# Patient Record
Sex: Male | Born: 1940 | Race: White | Hispanic: No | Marital: Married | Smoking: Never smoker
Health system: Southern US, Community
[De-identification: ages and names within clinical notes are randomized; demographics above are authoritative.]

## PROBLEM LIST (undated history)

## (undated) DIAGNOSIS — E785 Hyperlipidemia, unspecified: Secondary | ICD-10-CM

## (undated) DIAGNOSIS — Z0000 Encounter for general adult medical examination without abnormal findings: Secondary | ICD-10-CM

## (undated) DIAGNOSIS — D649 Anemia, unspecified: Principal | ICD-10-CM

## (undated) DIAGNOSIS — I10 Essential (primary) hypertension: Secondary | ICD-10-CM

## (undated) DIAGNOSIS — M940 Chondrocostal junction syndrome [Tietze]: Secondary | ICD-10-CM

## (undated) DIAGNOSIS — R000 Tachycardia, unspecified: Secondary | ICD-10-CM

## (undated) DIAGNOSIS — E669 Obesity, unspecified: Secondary | ICD-10-CM

## (undated) DIAGNOSIS — W57XXXA Bitten or stung by nonvenomous insect and other nonvenomous arthropods, initial encounter: Secondary | ICD-10-CM

## (undated) DIAGNOSIS — B029 Zoster without complications: Secondary | ICD-10-CM

## (undated) DIAGNOSIS — K219 Gastro-esophageal reflux disease without esophagitis: Secondary | ICD-10-CM

## (undated) DIAGNOSIS — M25569 Pain in unspecified knee: Secondary | ICD-10-CM

## (undated) DIAGNOSIS — G8929 Other chronic pain: Secondary | ICD-10-CM

## (undated) DIAGNOSIS — R040 Epistaxis: Secondary | ICD-10-CM

## (undated) DIAGNOSIS — R51 Headache: Secondary | ICD-10-CM

## (undated) DIAGNOSIS — K859 Acute pancreatitis without necrosis or infection, unspecified: Secondary | ICD-10-CM

## (undated) DIAGNOSIS — K5900 Constipation, unspecified: Secondary | ICD-10-CM

## (undated) DIAGNOSIS — J019 Acute sinusitis, unspecified: Secondary | ICD-10-CM

## (undated) HISTORY — DX: Pain in unspecified knee: M25.569

## (undated) HISTORY — DX: Encounter for general adult medical examination without abnormal findings: Z00.00

## (undated) HISTORY — DX: Other chronic pain: G89.29

## (undated) HISTORY — DX: Zoster without complications: B02.9

## (undated) HISTORY — DX: Anemia, unspecified: D64.9

## (undated) HISTORY — DX: Tachycardia, unspecified: R00.0

## (undated) HISTORY — DX: Headache: R51

## (undated) HISTORY — DX: Hypocalcemia: E83.51

## (undated) HISTORY — DX: Bitten or stung by nonvenomous insect and other nonvenomous arthropods, initial encounter: W57.XXXA

## (undated) HISTORY — DX: Hyperlipidemia, unspecified: E78.5

## (undated) HISTORY — DX: Acute pancreatitis without necrosis or infection, unspecified: K85.90

## (undated) HISTORY — DX: Acute sinusitis, unspecified: J01.90

## (undated) HISTORY — DX: Constipation, unspecified: K59.00

## (undated) HISTORY — DX: Epistaxis: R04.0

## (undated) HISTORY — DX: Obesity, unspecified: E66.9

## (undated) HISTORY — DX: Chondrocostal junction syndrome (tietze): M94.0

## (undated) HISTORY — PX: CHOLECYSTECTOMY: SHX55

---

## 2004-12-07 ENCOUNTER — Encounter: Admission: RE | Admit: 2004-12-07 | Discharge: 2004-12-07 | Payer: Self-pay | Admitting: Orthopedic Surgery

## 2011-05-05 ENCOUNTER — Encounter: Payer: Self-pay | Admitting: *Deleted

## 2011-05-05 ENCOUNTER — Emergency Department (HOSPITAL_BASED_OUTPATIENT_CLINIC_OR_DEPARTMENT_OTHER)
Admission: EM | Admit: 2011-05-05 | Discharge: 2011-05-06 | Disposition: A | Discharge status: Home / Self Care | Payer: Medicare Other | Attending: Emergency Medicine | Admitting: Emergency Medicine

## 2011-05-05 DIAGNOSIS — K219 Gastro-esophageal reflux disease without esophagitis: Secondary | ICD-10-CM | POA: Insufficient documentation

## 2011-05-05 DIAGNOSIS — R1013 Epigastric pain: Secondary | ICD-10-CM | POA: Insufficient documentation

## 2011-05-05 DIAGNOSIS — I10 Essential (primary) hypertension: Secondary | ICD-10-CM | POA: Insufficient documentation

## 2011-05-05 HISTORY — DX: Gastro-esophageal reflux disease without esophagitis: K21.9

## 2011-05-05 HISTORY — DX: Essential (primary) hypertension: I10

## 2011-05-05 NOTE — ED Notes (Signed)
Pt states that he has been having upper abd pain since yesterday caused by his acid reflux. Pt denies N/V/D, no diaphoresis.

## 2011-05-05 NOTE — ED Notes (Signed)
Pt c/o increased indigestion sxs since yesterday, pt currently treats sxs with nexium but has had little relief.

## 2011-05-06 ENCOUNTER — Other Ambulatory Visit: Payer: Self-pay

## 2011-05-06 MED ORDER — GI COCKTAIL ~~LOC~~
ORAL | Status: AC
  Filled 2011-05-06: qty 30

## 2011-05-06 MED ORDER — SUCRALFATE 1 G PO TABS
1.0000 g | ORAL_TABLET | Freq: Once | ORAL | Status: AC
  Administered 2011-05-06: 1 g via ORAL
  Filled 2011-05-06: qty 1

## 2011-05-06 MED ORDER — SUCRALFATE 1 G PO TABS: ORAL_TABLET | ORAL | Status: DC

## 2011-05-06 MED ORDER — GI COCKTAIL ~~LOC~~
30.0000 mL | Freq: Once | ORAL | Status: AC
  Administered 2011-05-06: 30 mL via ORAL

## 2011-05-06 NOTE — ED Provider Notes (Signed)
History     CSN: [id number] Arrival date & time: 05/05/2011 11:50 PM   First MD Initiated Contact with Patient 05/06/11 0141      Chief Complaint  Patient presents with  . Gastrophageal Reflux    (Consider location/radiation/quality/duration/timing/severity/associated sxs/prior treatment) HPI This is a 70 year old white male with a history of gastroesophageal reflux disease. He has been having about 36 hours of epigastric discomfort that his typical for his GERD symptoms. He describes this as a vague pain in the epigastrium that does not radiate. It has not improved with an extra dose of Nexium nor with carbonated beverages. He's had a decreased appetite today. He states he had a similar exacerbation once in the past. There is no associated nausea, vomiting, diarrhea, constipation, chest pain, dyspnea or diaphoresis. He was given a GI cocktail by his nurse prior to my evaluation with significant improvement in his symptoms.   Past Medical History  Diagnosis Date  . Hypertension   . GERD (gastroesophageal reflux disease)     Past Surgical History  Procedure Date  . Cholecystectomy     History reviewed. No pertinent family history.  History  Substance Use Topics  . Smoking status: Never Smoker   . Smokeless tobacco: Not on file  . Alcohol Use: No      Review of Systems  All other systems reviewed and are negative.    Allergies  Review of patient's allergies indicates not on file.  Home Medications   Current Outpatient Rx  Name Route Sig Dispense Refill  . ESOMEPRAZOLE MAGNESIUM 20 MG PO CPDR Oral Take 20 mg by mouth daily before breakfast.      . LISINOPRIL 40 MG PO TABS Oral Take 40 mg by mouth daily.        BP 170/100  Pulse 70  Temp(Src) 98.7 F (37.1 C) (Oral)  Resp 17  Ht 6\' 2"  (1.88 m)  Wt 230 lb (104.327 kg)  BMI 29.53 kg/m2  SpO2 98%  Physical Exam General: Well-developed, well-nourished male in no acute distress; appearance consistent with age  of record HENT: normocephalic, atraumatic Eyes: Normal appearance Neck: supple Heart: regular rate and rhythm Lungs: clear to auscultation bilaterally Abdomen: soft; nontender Extremities: No deformity; full range of motion; pulses normal Neurologic: Awake, alert and oriented; motor function intact in all extremities and symmetric; no facial droop Skin: Warm and dry Psychiatric: Normal mood and affect    ED Course  Procedures (including critical care time)    MDM   EKG Interpretation:  Date & Time: 05/06/2011 12:09 AM  Rate: 67  Rhythm: normal sinus rhythm  QRS Axis: normal  Intervals: normal  ST/T Wave abnormalities: normal  Conduction Disutrbances:none  Narrative Interpretation:   Old EKG Reviewed: none available  1:51 AM We'll add Carafate           Hanley Seamen, MD 05/06/11 0151

## 2011-05-06 NOTE — ED Notes (Signed)
MD at bedside. 

## 2011-07-11 DIAGNOSIS — L821 Other seborrheic keratosis: Secondary | ICD-10-CM | POA: Diagnosis not present

## 2011-07-11 DIAGNOSIS — B079 Viral wart, unspecified: Secondary | ICD-10-CM | POA: Diagnosis not present

## 2011-10-06 DIAGNOSIS — R7309 Other abnormal glucose: Secondary | ICD-10-CM | POA: Diagnosis not present

## 2011-10-06 DIAGNOSIS — Z0000 Encounter for general adult medical examination without abnormal findings: Secondary | ICD-10-CM | POA: Diagnosis not present

## 2011-10-06 DIAGNOSIS — I10 Essential (primary) hypertension: Secondary | ICD-10-CM | POA: Diagnosis not present

## 2011-10-06 DIAGNOSIS — M21619 Bunion of unspecified foot: Secondary | ICD-10-CM | POA: Diagnosis not present

## 2011-10-06 DIAGNOSIS — R05 Cough: Secondary | ICD-10-CM | POA: Diagnosis not present

## 2011-10-06 DIAGNOSIS — Z125 Encounter for screening for malignant neoplasm of prostate: Secondary | ICD-10-CM | POA: Diagnosis not present

## 2011-12-21 DIAGNOSIS — J019 Acute sinusitis, unspecified: Secondary | ICD-10-CM | POA: Diagnosis not present

## 2011-12-21 DIAGNOSIS — H66009 Acute suppurative otitis media without spontaneous rupture of ear drum, unspecified ear: Secondary | ICD-10-CM | POA: Diagnosis not present

## 2011-12-28 DIAGNOSIS — L02419 Cutaneous abscess of limb, unspecified: Secondary | ICD-10-CM | POA: Diagnosis not present

## 2012-04-02 ENCOUNTER — Ambulatory Visit (INDEPENDENT_AMBULATORY_CARE_PROVIDER_SITE_OTHER): Payer: Medicare Other | Admitting: Internal Medicine

## 2012-04-02 ENCOUNTER — Encounter: Payer: Self-pay | Admitting: Internal Medicine

## 2012-04-02 VITALS — BP 156/90 | HR 66 | Temp 97.8°F | Resp 16 | Ht 74.25 in | Wt 237.8 lb

## 2012-04-02 DIAGNOSIS — I10 Essential (primary) hypertension: Secondary | ICD-10-CM | POA: Insufficient documentation

## 2012-04-02 DIAGNOSIS — Z125 Encounter for screening for malignant neoplasm of prostate: Secondary | ICD-10-CM | POA: Diagnosis not present

## 2012-04-02 DIAGNOSIS — K219 Gastro-esophageal reflux disease without esophagitis: Secondary | ICD-10-CM

## 2012-04-02 DIAGNOSIS — Z79899 Other long term (current) drug therapy: Secondary | ICD-10-CM

## 2012-04-02 DIAGNOSIS — Z23 Encounter for immunization: Secondary | ICD-10-CM | POA: Diagnosis not present

## 2012-04-02 HISTORY — DX: Essential (primary) hypertension: I10

## 2012-04-02 LAB — CBC WITH DIFFERENTIAL/PLATELET
Basophils Relative: 1 % (ref 0–1)
HCT: 38.5 % — ABNORMAL LOW (ref 39.0–52.0)
Hemoglobin: 13.6 g/dL (ref 13.0–17.0)
Lymphocytes Relative: 24 % (ref 12–46)
Lymphs Abs: 1.4 10*3/uL (ref 0.7–4.0)
MCV: 90.6 fL (ref 78.0–100.0)
Monocytes Absolute: 0.5 10*3/uL (ref 0.1–1.0)
Monocytes Relative: 9 % (ref 3–12)
Neutro Abs: 4 10*3/uL (ref 1.7–7.7)
Neutrophils Relative %: 65 % (ref 43–77)
RBC: 4.25 MIL/uL (ref 4.22–5.81)

## 2012-04-02 LAB — BASIC METABOLIC PANEL
BUN: 14 mg/dL (ref 6–23)
Creat: 1.19 mg/dL (ref 0.50–1.35)
Potassium: 5 mEq/L (ref 3.5–5.3)
Sodium: 141 mEq/L (ref 135–145)

## 2012-04-02 LAB — PSA, MEDICARE: PSA: 0.53 ng/mL (ref <=4.00)

## 2012-04-02 MED ORDER — LISINOPRIL 40 MG PO TABS: 40.0000 mg | ORAL_TABLET | Freq: Every day | ORAL | Status: DC

## 2012-04-02 MED ORDER — ESOMEPRAZOLE MAGNESIUM 20 MG PO CPDR: 20.0000 mg | DELAYED_RELEASE_CAPSULE | Freq: Every day | ORAL | Status: DC

## 2012-04-02 NOTE — Assessment & Plan Note (Signed)
Elevated blood pressure. Unclear trend. Recommend out patient blood pressure log to be submitted for review. Followup in three weeks or sooner if necessary. Obtain CBC Chem-7 and TSH. Continue current dose of lisinopril  currently.

## 2012-04-02 NOTE — Assessment & Plan Note (Signed)
Stable. Continue Nexium daily.

## 2012-04-02 NOTE — Progress Notes (Signed)
  Subjective:    Patient ID: Anthony Poole, male    DOB: [birth year], 71 y.o.   MRN: [id number]  HPI patient presents to clinic to establish care and for evaluation of multiple medical problems. Has no history of hypertension treated with fosinopril without cough or lip swelling. Blood pressure elevated on triage and rechecked to be 156/90. Notes intermittent headache without neurologic deficit. Has GERD symptoms well controlled on Nexium daily. Has not undergone colonoscopy and declines. Understands current recommendation for colorectal cancer screening. Believes underwent Zostavax 2012  Past Medical History  Diagnosis Date  . Hypertension   . GERD (gastroesophageal reflux disease)    Past Surgical History  Procedure Date  . Cholecystectomy     reports that he has never smoked. He does not have any smokeless tobacco history on file. He reports that he does not drink alcohol or use illicit drugs. family history is not on file. Allergies  Allergen Reactions  . Fish Allergy Itching     Review of Systems  Neurological: Positive for headaches.  All other systems reviewed and are negative.       Objective:   Physical Exam  Nursing note and vitals reviewed. Constitutional: He appears well-developed and well-nourished. No distress.  HENT:  Head: Normocephalic and atraumatic.  Right Ear: External ear normal.  Left Ear: External ear normal.  Eyes: Conjunctivae normal and EOM are normal.  Neck: Neck supple. No thyromegaly present.  Cardiovascular: Normal rate, regular rhythm and normal heart sounds.  Exam reveals no gallop and no friction rub.   No murmur heard. Pulmonary/Chest: Effort normal and breath sounds normal. No respiratory distress. He has no wheezes. He has no rales.  Lymphadenopathy:    He has no cervical adenopathy.  Neurological: He is alert.  Skin: Skin is warm and dry. He is not diaphoretic.  Psychiatric: He has a normal mood and affect.          Assessment  & Plan:

## 2012-04-04 ENCOUNTER — Telehealth: Payer: Self-pay | Admitting: Internal Medicine

## 2012-04-04 NOTE — Telephone Encounter (Signed)
Received medical records from [facility]  P: [phone] F: [phone]

## 2012-04-23 ENCOUNTER — Ambulatory Visit (INDEPENDENT_AMBULATORY_CARE_PROVIDER_SITE_OTHER): Payer: Medicare Other | Admitting: Internal Medicine

## 2012-04-23 ENCOUNTER — Encounter: Payer: Self-pay | Admitting: Internal Medicine

## 2012-04-23 VITALS — BP 136/96 | HR 63 | Temp 97.9°F | Resp 18 | Wt 235.0 lb

## 2012-04-23 DIAGNOSIS — M549 Dorsalgia, unspecified: Secondary | ICD-10-CM

## 2012-04-23 DIAGNOSIS — I10 Essential (primary) hypertension: Secondary | ICD-10-CM

## 2012-04-23 MED ORDER — CYCLOBENZAPRINE HCL 5 MG PO TABS: 5.0000 mg | ORAL_TABLET | Freq: Three times a day (TID) | ORAL | Status: DC | PRN

## 2012-04-23 MED ORDER — AMLODIPINE BESYLATE 5 MG PO TABS: 5.0000 mg | ORAL_TABLET | Freq: Every day | ORAL | Status: DC

## 2012-04-23 NOTE — Progress Notes (Signed)
  Subjective:    Patient ID: Anthony Poole, male    DOB: 23-Feb-1941, 71 y.o.   MRN: [id number]  HPI Pt presents to clinic for follow up of HTN. Blood pressure reviewed elevated without associated symptoms. Compliant with medication without adverse effect. Notes recent left paraspinal lumbar pain without radicular pain, numbness or tingling or weakness. No injury or trauma.  Past Medical History  Diagnosis Date  . Hypertension   . GERD (gastroesophageal reflux disease)    Past Surgical History  Procedure Date  . Cholecystectomy     reports that he has never smoked. He does not have any smokeless tobacco history on file. He reports that he does not drink alcohol or use illicit drugs. family history is not on file. Allergies  Allergen Reactions  . Fish Allergy Itching     Review of Systems see hpi     Objective:   Physical Exam  Physical Exam  Nursing note and vitals reviewed. Constitutional: Appears well-developed and well-nourished. No distress.  HENT:  Head: Normocephalic and atraumatic.  Right Ear: External ear normal.  Left Ear: External ear normal.  Eyes: Conjunctivae are normal. No scleral icterus.  Neck: Neck supple. Carotid bruit is not present.  Cardiovascular: Normal rate, regular rhythm and normal heart sounds.  Exam reveals no gallop and no friction rub.   No murmur heard. Pulmonary/Chest: Effort normal and breath sounds normal. No respiratory distress. He has no wheezes. no rales.  Lymphadenopathy:    He has no cervical adenopathy.  Neurological:Alert.  Skin: Skin is warm and dry. Not diaphoretic.  Psychiatric: Has a normal mood and affect.  Muscle skeletal: Left paraspinal muscle tenderness. No lower extremity weakness. Gait normal. No midline lumbar sacral tenderness or bony abnormality      Assessment & Plan:

## 2012-04-29 DIAGNOSIS — M545 Low back pain, unspecified: Secondary | ICD-10-CM

## 2012-04-29 HISTORY — DX: Low back pain, unspecified: M54.50

## 2012-04-29 NOTE — Assessment & Plan Note (Signed)
Suboptimal control. Add Norvasc 5 mg daily. Monitor blood pressure regularly and call blood pressure log to clinic in approximately 2 weeks.

## 2012-04-29 NOTE — Assessment & Plan Note (Signed)
Attempt Flexeril when necessary. Cautioned regarding possible sedating effect. Followup if no improvement or worsening.

## 2012-05-14 ENCOUNTER — Other Ambulatory Visit: Payer: Self-pay | Admitting: *Deleted

## 2012-05-14 MED ORDER — ESOMEPRAZOLE MAGNESIUM 20 MG PO CPDR: 40.0000 mg | DELAYED_RELEASE_CAPSULE | Freq: Every day | ORAL | Status: DC

## 2012-05-14 NOTE — Progress Notes (Signed)
Per VO, TWH/SLS

## 2012-05-15 ENCOUNTER — Telehealth: Payer: Self-pay | Admitting: Internal Medicine

## 2012-05-15 NOTE — Telephone Encounter (Signed)
Reviewed outpt bp log. Results normotensive on norvasc.

## 2012-05-24 ENCOUNTER — Telehealth: Payer: Self-pay | Admitting: *Deleted

## 2012-05-24 NOTE — Telephone Encounter (Signed)
Received message from pt's wife stating pt had previously been on Nexium 40mg  and last rx he picked up was for 20mg  and his reflux symptoms have flared up. Pt is requesting to go back on 40mg . Upon review of record it was noted that rx recently sent for nexium 20mg , take 2 capsules daily on 05/14/12. Advised pt's wife to check direction on current bottle and if he hasn't already, to pick up rx from 05/14/12. She voices understanding and will follow up with pt.

## 2012-06-04 ENCOUNTER — Telehealth: Payer: Self-pay | Admitting: Internal Medicine

## 2012-06-04 ENCOUNTER — Other Ambulatory Visit: Payer: Self-pay | Admitting: *Deleted

## 2012-06-04 MED ORDER — ESOMEPRAZOLE MAGNESIUM 40 MG PO CPDR: 40.0000 mg | DELAYED_RELEASE_CAPSULE | Freq: Every day | ORAL | Status: DC

## 2012-06-04 NOTE — Progress Notes (Signed)
Resend; Rx refill error/SLS 

## 2012-06-04 NOTE — Telephone Encounter (Signed)
Rx to pharmacy/SLS 

## 2012-06-04 NOTE — Addendum Note (Signed)
Addended by: Regis Bill on: 06/04/2012 04:52 PM   Modules accepted: Orders

## 2012-06-04 NOTE — Telephone Encounter (Signed)
He has taken 40mg  of Nexium for years.  This rx was sent as 20mg  a few months ago.  He is taking 2 pills.  He runs out very quickly and it is costing much more than taking a 40mg .  He would like rx to [facility] for 40mg  Nexium and a 90 days supply.

## 2012-06-04 NOTE — Progress Notes (Signed)
Resend; Rx refill error/SLS

## 2012-07-05 DIAGNOSIS — J0100 Acute maxillary sinusitis, unspecified: Secondary | ICD-10-CM | POA: Diagnosis not present

## 2012-07-05 DIAGNOSIS — J209 Acute bronchitis, unspecified: Secondary | ICD-10-CM | POA: Diagnosis not present

## 2012-07-06 ENCOUNTER — Ambulatory Visit: Payer: Medicare Other | Admitting: Internal Medicine

## 2012-07-23 ENCOUNTER — Ambulatory Visit (INDEPENDENT_AMBULATORY_CARE_PROVIDER_SITE_OTHER): Payer: Medicare Other | Admitting: Family Medicine

## 2012-07-23 ENCOUNTER — Encounter: Payer: Self-pay | Admitting: Family Medicine

## 2012-07-23 VITALS — BP 130/80 | HR 60 | Temp 97.5°F | Ht 74.0 in | Wt 237.1 lb

## 2012-07-23 DIAGNOSIS — Z0000 Encounter for general adult medical examination without abnormal findings: Secondary | ICD-10-CM

## 2012-07-23 DIAGNOSIS — I10 Essential (primary) hypertension: Secondary | ICD-10-CM | POA: Diagnosis not present

## 2012-07-23 DIAGNOSIS — K219 Gastro-esophageal reflux disease without esophagitis: Secondary | ICD-10-CM | POA: Diagnosis not present

## 2012-07-23 DIAGNOSIS — D649 Anemia, unspecified: Secondary | ICD-10-CM | POA: Diagnosis not present

## 2012-07-23 LAB — CBC
HCT: 40 % (ref 39.0–52.0)
Hemoglobin: 13.9 g/dL (ref 13.0–17.0)
Platelets: 238 10*3/uL (ref 150–400)
RDW: 13.6 % (ref 11.5–15.5)
WBC: 5.3 10*3/uL (ref 4.0–10.5)

## 2012-07-23 NOTE — Patient Instructions (Addendum)
For next 10 days take plain Mucinex 600mg  1 tab po twice a day and a probiotic such as Align, Digestive Advantage etc  Anemia, Frequently Asked Questions WHAT ARE THE SYMPTOMS OF ANEMIA?  Headache.  Difficulty thinking.  Fatigue.  Shortness of breath.  Weakness.  Rapid heartbeat. AT WHAT POINT ARE PEOPLE CONSIDERED ANEMIC?  This varies with gender and age.   Both hemoglobin (Hgb) and hematocrit values are used to define anemia. These lab values are obtained from a complete blood count (CBC) test. This is performed at a caregiver's office.  The normal range of hemoglobin values for adult men is 14.0 g/dL to 45.4 g/dL. For nonpregnant women, values are 12.3 g/dL to 09.8 g/dL.  The World Health Organization defines anemia as less than 12 g/dL for nonpregnant women and less than 13 g/dL for men.  For adult males, the average normal hematocrit is 46%, and the range is 40% to 52%.  For adult females, the average normal hematocrit is 41%, and the range is 35% to 47%.  Values that fall below the lower limits can be a sign of anemia and should have further checking (evaluation). GROUPS OF PEOPLE WHO ARE AT RISK FOR DEVELOPING ANEMIA INCLUDE:   Infants who are breastfed or taking a formula that is not fortified with iron.  Children going through a rapid growth spurt. The iron available can not keep up with the needs for a red cell mass which must grow with the child.  Women in childbearing years. They need iron because of blood loss during menstruation.  Pregnant women. The growing fetus creates a high demand for iron.  People with ongoing gastrointestinal blood loss are at risk of developing iron deficiency.  Individuals with leukemia or cancer who must receive chemotherapy or radiation to treat their disease. The drugs or radiation used to treat these diseases often decreases the bone marrow's ability to make cells of all classes. This includes red blood cells, white blood cells,  and platelets.  Individuals with chronic inflammatory conditions such as rheumatoid arthritis or chronic infections.  The elderly. ARE SOME TYPES OF ANEMIA INHERITED?   Yes, some types of anemia are due to inherited or genetic defects.  Sickle cell anemia. This occurs most often in people of African, African American, and Mediterranean descent.  Thalassemia (or Cooley's anemia). This type is found in people of Mediterranean and Southeast Asian descent. These types of anemia are common.  Fanconi. This is rare. CAN CERTAIN MEDICATIONS CAUSE A PERSON TO BECOME ANEMIC?  Yes. For example, drugs to fight cancer (chemotherapeutic agents) often cause anemia. These drugs can slow the bone marrow's ability to make red blood cells. If there are not enough red blood cells, the body does not get enough oxygen. WHAT HEMATOCRIT LEVEL IS REQUIRED TO DONATE BLOOD?  The lower limit of an acceptable hematocrit for blood donors is 38%. If you have a low hematocrit value, you should schedule an appointment with your caregiver. ARE BLOOD TRANSFUSIONS COMMONLY USED TO CORRECT ANEMIA, AND ARE THEY DANGEROUS?  They are used to treat anemia as a last resort. Your caregiver will find the cause of the anemia and correct it if possible. Most blood transfusions are given because of excessive bleeding at the time of surgery, with trauma, or because of bone marrow suppression in patients with cancer or leukemia on chemotherapy. Blood transfusions are safer than ever before. We also know that blood transfusions affect the immune system and may increase certain risks. There is  also a concern for human error. In 1/16,000 transfusions, a patient receives a transfusion of blood that is not matched with his or her blood type.  WHAT IS IRON DEFICIENCY ANEMIA AND CAN I CORRECT IT BY CHANGING MY DIET?  Iron is an essential part of hemoglobin. Without enough hemoglobin, anemia develops and the body does not get the right amount of  oxygen. Iron deficiency anemia develops after the body has had a low level of iron for a long time. This is either caused by blood loss, not taking in or absorbing enough iron, or increased demands for iron (like pregnancy or rapid growth).  Foods from animal origin such as beef, chicken, and pork, are good sources of iron. Be sure to have one of these foods at each meal. Vitamin C helps your body absorb iron. Foods rich in Vitamin C include citrus, bell pepper, strawberries, spinach and cantaloupe. In some cases, iron supplements may be needed in order to correct the iron deficiency. In the case of poor absorption, extra iron may have to be given directly into the vein through a needle (intravenously). I HAVE BEEN DIAGNOSED WITH IRON DEFICIENCY ANEMIA AND MY CAREGIVER PRESCRIBED IRON SUPPLEMENTS. HOW LONG WILL IT TAKE FOR MY BLOOD TO BECOME NORMAL?  It depends on the degree of anemia at the beginning of treatment. Most people with mild to moderate iron deficiency, anemia will correct the anemia over a period of 2 to 3 months. But after the anemia is corrected, the iron stored by the body is still low. Caregivers often suggest an additional 6 months of oral iron therapy once the anemia has been reversed. This will help prevent the iron deficiency anemia from quickly happening again. Non-anemic adult males should take iron supplements only under the direction of a doctor, too much iron can cause liver damage.  MY HEMOGLOBIN IS 9 G/DL AND I AM SCHEDULED FOR SURGERY. SHOULD I POSTPONE THE SURGERY?  If you have Hgb of 9, you should discuss this with your caregiver right away. Many patients with similar hemoglobin levels have had surgery without problems. If minimal blood loss is expected for a minor procedure, no treatment may be necessary.  If a greater blood loss is expected for more extensive procedures, you should ask your caregiver about being treated with erythropoietin and iron. This is to accelerate the  recovery of your hemoglobin to a normal level before surgery. An anemic patient who undergoes high-blood-loss surgery has a greater risk of surgical complications and need for a blood transfusion, which also carries some risk.  I HAVE BEEN TOLD THAT HEAVY MENSTRUAL PERIODS CAUSE ANEMIA. IS THERE ANYTHING I CAN DO TO PREVENT THE ANEMIA?  Anemia that results from heavy periods is usually due to iron deficiency. You can try to meet the increased demands for iron caused by the heavy monthly blood loss by increasing the intake of iron-rich foods. Iron supplements may be required. Discuss your concerns with your caregiver. WHAT CAUSES ANEMIA DURING PREGNANCY?  Pregnancy places major demands on the body. The mother must meet the needs of both her body and her growing baby. The body needs enough iron and folate to make the right amount of red blood cells. To prevent anemia while pregnant, the mother should stay in close contact with her caregiver.  Be sure to eat a diet that has foods rich in iron and folate like liver and dark green leafy vegetables. Folate plays an important role in the normal development of a baby's  spinal cord. Folate can help prevent serious disorders like spina bifida. If your diet does not provide adequate nutrients, you may want to talk with your caregiver about nutritional supplements.  WHAT IS THE RELATIONSHIP BETWEEN FIBROID TUMORS AND ANEMIA IN WOMEN?  The relationship is usually caused by the increased menstrual blood loss caused by fibroids. Good iron intake may be required to prevent iron deficiency anemia from developing.  Document Released: 01/27/2004 Document Revised: 09/12/2011 Document Reviewed: 07/13/2010 [facility] Patient Information 2013 [place], [place].

## 2012-07-29 ENCOUNTER — Encounter: Payer: Self-pay | Admitting: Family Medicine

## 2012-07-29 DIAGNOSIS — D649 Anemia, unspecified: Secondary | ICD-10-CM | POA: Insufficient documentation

## 2012-07-29 DIAGNOSIS — Z0000 Encounter for general adult medical examination without abnormal findings: Secondary | ICD-10-CM

## 2012-07-29 HISTORY — DX: Encounter for general adult medical examination without abnormal findings: Z00.00

## 2012-07-29 HISTORY — DX: Anemia, unspecified: D64.9

## 2012-07-29 NOTE — Assessment & Plan Note (Signed)
Denies symptoms but does have a persistent cough. Continue nexium

## 2012-07-29 NOTE — Assessment & Plan Note (Signed)
Declines referral for colonoscopy despite discussion of risk of colon cancer.

## 2012-07-29 NOTE — Progress Notes (Signed)
Patient ID: Anthony Poole, male   DOB: [birth year], 72 y.o.   MRN: [id number] Anthony Poole [id number] 09/04/1940 07/29/2012      Progress Note-Follow Up  Subjective  Chief Complaint  Chief Complaint  Patient presents with  . Follow-up    3 month- BP    HPI  Patient is a 72 Caucasian male who is in today for three-month followup. He did have a respiratory infection went to urgent care was given some antibiotics some coughs her back in December. Continues to have a dry cough although the rest of the symptoms have resolved. He denies fever and throat pain. Denies chest pain or palpitations. No shortness of breath GI or GU complaints are noted today.  Past Medical History  Diagnosis Date  . Hypertension   . GERD (gastroesophageal reflux disease)   . Preventative health care 07/29/2012  . Anemia 07/29/2012    Past Surgical History  Procedure Date  . Cholecystectomy     History reviewed. No pertinent family history.  History   Social History  . Marital Status: Married    Spouse Name: N/A    Number of Children: N/A  . Years of Education: N/A   Occupational History  . Not on file.   Social History Main Topics  . Smoking status: Never Smoker   . Smokeless tobacco: Not on file  . Alcohol Use: No  . Drug Use: No  . Sexually Active:    Other Topics Concern  . Not on file   Social History Narrative  . No narrative on file    Current Outpatient Prescriptions on File Prior to Visit  Medication Sig Dispense Refill  . amLODipine (NORVASC) 5 MG tablet Take 1 tablet (5 mg total) by mouth daily.  30 tablet  6  . aspirin 81 MG tablet Take 81 mg by mouth daily.      Marland Kitchen esomeprazole (NEXIUM) 40 MG capsule Take 1 capsule (40 mg total) by mouth daily.  30 capsule  3  . lisinopril (PRINIVIL,ZESTRIL) 40 MG tablet Take 1 tablet (40 mg total) by mouth daily.  30 tablet  6  . Misc Natural Products (OSTEO BI-FLEX ADV JOINT SHIELD PO) Take by mouth daily.      . Multiple  Vitamins-Minerals (CENTRUM SILVER ADULT 50+ PO) Take by mouth daily.        Allergies  Allergen Reactions  . Fish Allergy Itching    Review of Systems  Review of Systems  Constitutional: Negative for fever and malaise/fatigue.  HENT: Negative for congestion.   Eyes: Negative for discharge.  Respiratory: Positive for cough and sputum production. Negative for shortness of breath.   Cardiovascular: Negative for chest pain, palpitations and leg swelling.  Gastrointestinal: Negative for nausea, abdominal pain and diarrhea.  Genitourinary: Negative for dysuria.  Musculoskeletal: Negative for falls.  Skin: Negative for rash.  Neurological: Negative for loss of consciousness and headaches.  Endo/Heme/Allergies: Negative for polydipsia.  Psychiatric/Behavioral: Negative for depression and suicidal ideas. The patient is not nervous/anxious and does not have insomnia.     Objective  BP 130/80  Pulse 60  Temp 97.5 F (36.4 C) (Oral)  Ht 6\' 2"  (1.88 m)  Wt 237 lb 1.3 oz (107.539 kg)  BMI 30.44 kg/m2  SpO2 98%  Physical Exam  Physical Exam  Lab Results  Component Value Date   TSH 1.232 04/02/2012   Lab Results  Component Value Date   WBC 5.3 07/23/2012   HGB 13.9 07/23/2012   HCT  40.0 07/23/2012   MCV 91.5 07/23/2012   PLT 238 07/23/2012   Lab Results  Component Value Date   CREATININE 1.19 04/02/2012   BUN 14 04/02/2012   NA 141 04/02/2012   K 5.0 04/02/2012   CL 107 04/02/2012   CO2 27 04/02/2012     Assessment & Plan  Preventative health care Declines referral for colonoscopy despite discussion of risk of colon cancer.   GERD (gastroesophageal reflux disease) Denies symptoms but does have a persistent cough. Continue nexium  Unspecified essential hypertension Well controlled today, avoid sodium and caffeine. No change in meds.  Anemia Repeat cbc today shows it has resolved, will continue to monitor

## 2012-07-29 NOTE — Assessment & Plan Note (Signed)
Repeat cbc today shows it has resolved, will continue to monitor

## 2012-07-29 NOTE — Assessment & Plan Note (Signed)
Well controlled today, avoid sodium and caffeine. No change in meds.

## 2012-08-27 ENCOUNTER — Encounter (HOSPITAL_BASED_OUTPATIENT_CLINIC_OR_DEPARTMENT_OTHER): Payer: Self-pay | Admitting: Family Medicine

## 2012-08-27 ENCOUNTER — Emergency Department (HOSPITAL_BASED_OUTPATIENT_CLINIC_OR_DEPARTMENT_OTHER): Payer: Medicare Other

## 2012-08-27 ENCOUNTER — Emergency Department (HOSPITAL_BASED_OUTPATIENT_CLINIC_OR_DEPARTMENT_OTHER)
Admission: EM | Admit: 2012-08-27 | Discharge: 2012-08-27 | Disposition: A | Discharge status: Home / Self Care | Payer: Medicare Other | Attending: Emergency Medicine | Admitting: Emergency Medicine

## 2012-08-27 ENCOUNTER — Other Ambulatory Visit (HOSPITAL_BASED_OUTPATIENT_CLINIC_OR_DEPARTMENT_OTHER): Payer: Self-pay | Admitting: Emergency Medicine

## 2012-08-27 DIAGNOSIS — K219 Gastro-esophageal reflux disease without esophagitis: Secondary | ICD-10-CM | POA: Diagnosis not present

## 2012-08-27 DIAGNOSIS — Z862 Personal history of diseases of the blood and blood-forming organs and certain disorders involving the immune mechanism: Secondary | ICD-10-CM | POA: Diagnosis not present

## 2012-08-27 DIAGNOSIS — R42 Dizziness and giddiness: Secondary | ICD-10-CM | POA: Diagnosis not present

## 2012-08-27 DIAGNOSIS — R209 Unspecified disturbances of skin sensation: Secondary | ICD-10-CM | POA: Diagnosis not present

## 2012-08-27 DIAGNOSIS — I10 Essential (primary) hypertension: Secondary | ICD-10-CM | POA: Diagnosis not present

## 2012-08-27 DIAGNOSIS — Z79899 Other long term (current) drug therapy: Secondary | ICD-10-CM | POA: Insufficient documentation

## 2012-08-27 DIAGNOSIS — Z1389 Encounter for screening for other disorder: Secondary | ICD-10-CM

## 2012-08-27 DIAGNOSIS — Z7982 Long term (current) use of aspirin: Secondary | ICD-10-CM | POA: Diagnosis not present

## 2012-08-27 DIAGNOSIS — R5381 Other malaise: Secondary | ICD-10-CM | POA: Insufficient documentation

## 2012-08-27 DIAGNOSIS — R51 Headache: Secondary | ICD-10-CM | POA: Insufficient documentation

## 2012-08-27 LAB — CBC WITH DIFFERENTIAL/PLATELET
Basophils Absolute: 0 K/uL (ref 0.0–0.1)
Basophils Relative: 1 % (ref 0–1)
Eosinophils Absolute: 0.1 K/uL (ref 0.0–0.7)
Eosinophils Relative: 2 % (ref 0–5)
HCT: 42.6 % (ref 39.0–52.0)
Hemoglobin: 14.6 g/dL (ref 13.0–17.0)
Lymphocytes Relative: 30 % (ref 12–46)
Lymphs Abs: 1.6 K/uL (ref 0.7–4.0)
MCH: 32 pg (ref 26.0–34.0)
MCHC: 34.3 g/dL (ref 30.0–36.0)
MCV: 93.4 fL (ref 78.0–100.0)
Monocytes Absolute: 0.6 10*3/uL (ref 0.1–1.0)
Monocytes Relative: 11 % (ref 3–12)
Neutro Abs: 3 K/uL (ref 1.7–7.7)
Neutrophils Relative %: 57 % (ref 43–77)
Platelets: 209 10*3/uL (ref 150–400)
RBC: 4.56 MIL/uL (ref 4.22–5.81)
RDW: 12.5 % (ref 11.5–15.5)
WBC: 5.2 10*3/uL (ref 4.0–10.5)

## 2012-08-27 LAB — COMPREHENSIVE METABOLIC PANEL WITH GFR
ALT: 21 U/L (ref 0–53)
BUN: 15 mg/dL (ref 6–23)
GFR calc Af Amer: 69 mL/min — ABNORMAL LOW (ref >90)
Glucose, Bld: 102 mg/dL — ABNORMAL HIGH (ref 70–99)
Potassium: 4.4 meq/L (ref 3.5–5.1)
Sodium: 140 meq/L (ref 135–145)

## 2012-08-27 LAB — COMPREHENSIVE METABOLIC PANEL
AST: 23 U/L (ref 0–37)
Albumin: 3.9 g/dL (ref 3.5–5.2)
Alkaline Phosphatase: 76 U/L (ref 39–117)
CO2: 26 mEq/L (ref 19–32)
Calcium: 9.1 mg/dL (ref 8.4–10.5)
Chloride: 105 mEq/L (ref 96–112)
Creatinine, Ser: 1.2 mg/dL (ref 0.50–1.35)
GFR calc non Af Amer: 60 mL/min — ABNORMAL LOW (ref >90)
Total Bilirubin: 0.7 mg/dL (ref 0.3–1.2)
Total Protein: 7.4 g/dL (ref 6.0–8.3)

## 2012-08-27 LAB — TROPONIN I: Troponin I: <0.3 ng/mL (ref <0.30)

## 2012-08-27 NOTE — ED Provider Notes (Signed)
History     CSN: [id number]  Arrival date & time 08/27/12  1016   First MD Initiated Contact with Patient 08/27/12 1031      Chief Complaint  Patient presents with  . Dizziness  . Weakness    (Consider location/radiation/quality/duration/timing/severity/associated sxs/prior treatment) HPI Comments: Patient reports these symptoms will come on rather rapidly, denies feeling syncopal, denies any visual changes but reports that he feels like he needs to sit down so that he does not fall down. Symptoms last 3-4 minutes at most. It is not associated with shortness of breath, cough, fevers. He reports no focal weakness of arms or legs but does endorse a numbness primarily around his mouth and lips on the right side of his face. The numbness resolves as his weakness and dizziness resolved. He was put on a second blood pressure medicine a proximally 6 months ago. Otherwise no other new medications. He denies history of stroke, TIA or coronary disease. He denies smoking history. As symptoms are persisting and not improving, the patient decided to come to the emergency department for further evaluation. Patient and family denies any speech difficulty. He does have an occasional dull headache worse at nighttime but always improves. Is not associated with his current symptoms and has been present for longer period of time and he attributes that to a side effect of his medication for reflux.  Patient is a 72 y.o. male presenting with weakness. The history is provided by the patient and the spouse.  Weakness This is a new problem. The problem occurs daily. The problem has not changed since onset.Pertinent negatives include no chest pain, no abdominal pain, no headaches and no shortness of breath.    Past Medical History  Diagnosis Date  . Hypertension   . GERD (gastroesophageal reflux disease)   . Preventative health care 07/29/2012  . Anemia 07/29/2012    Past Surgical History  Procedure Laterality  Date  . Cholecystectomy      History reviewed. No pertinent family history.  History  Substance Use Topics  . Smoking status: Never Smoker   . Smokeless tobacco: Not on file  . Alcohol Use: No      Review of Systems  HENT: Negative for neck pain and neck stiffness.   Respiratory: Negative for chest tightness and shortness of breath.   Cardiovascular: Negative for chest pain and palpitations.  Gastrointestinal: Negative for abdominal pain and blood in stool.  Musculoskeletal: Negative for back pain.  Skin: Negative for color change and rash.  Neurological: Positive for dizziness and weakness. Negative for syncope, speech difficulty, light-headedness and headaches.  All other systems reviewed and are negative.    Allergies  Fish allergy  Home Medications   Current Outpatient Rx  Name  Route  Sig  Dispense  Refill  . amLODipine (NORVASC) 5 MG tablet   Oral   Take 1 tablet (5 mg total) by mouth daily.   30 tablet   6   . aspirin 81 MG tablet   Oral   Take 81 mg by mouth daily.         Marland Kitchen esomeprazole (NEXIUM) 40 MG capsule   Oral   Take 1 capsule (40 mg total) by mouth daily.   30 capsule   3   . lisinopril (PRINIVIL,ZESTRIL) 40 MG tablet   Oral   Take 1 tablet (40 mg total) by mouth daily.   30 tablet   6   . Misc Natural Products (OSTEO BI-FLEX ADV JOINT SHIELD  PO)   Oral   Take by mouth daily.         . Multiple Vitamins-Minerals (CENTRUM SILVER ADULT 50+ PO)   Oral   Take by mouth daily.           BP 161/94  Pulse 64  Temp(Src) 97.6 F (36.4 C) (Oral)  Resp 20  Ht 6\' 3"  (1.905 m)  Wt 230 lb (104.327 kg)  BMI 28.75 kg/m2  SpO2 100%  Physical Exam  Nursing note and vitals reviewed. Constitutional: He is oriented to person, place, and time. He appears well-developed and well-nourished. No distress.  HENT:  Head: Normocephalic and atraumatic.  Eyes: EOM are normal. Pupils are equal, round, and reactive to light.  Neck: Normal range of  motion. Neck supple.  Cardiovascular: Normal rate and regular rhythm.   Pulmonary/Chest: Effort normal.  Abdominal: Soft.  Neurological: He is alert and oriented to person, place, and time. He displays no atrophy and normal reflexes. No cranial nerve deficit or sensory deficit. He exhibits normal muscle tone. Coordination and gait normal. GCS eye subscore is 4. GCS verbal subscore is 5. GCS motor subscore is 6.  Patient is not orthostatic, subjectively nor by vital sign changes. Finger to nose as well as heel shin testing is symmetric and normal.  Skin: Skin is warm. He is not diaphoretic.    ED Course  Procedures (including critical care time)  Labs Reviewed  COMPREHENSIVE METABOLIC PANEL - Abnormal; Notable for the following:    Glucose, Bld 102 (*)    GFR calc non Af Amer 60 (*)    GFR calc Af Amer 69 (*)    All other components within normal limits  CBC WITH DIFFERENTIAL  TROPONIN I   Ct Head Wo Contrast  08/27/2012  *RADIOLOGY REPORT*  Clinical Data: Dizziness, weakness.  CT HEAD WITHOUT CONTRAST  Technique:  Contiguous axial images were obtained from the base of the skull through the vertex without contrast.  Comparison: None.  Findings: No acute intracranial abnormality.  Specifically, no hemorrhage, hydrocephalus, mass lesion, acute infarction, or significant intracranial injury.  No acute calvarial abnormality. Visualized paranasal sinuses and mastoids clear.  Orbital soft tissues unremarkable.  IMPRESSION: No acute intracranial abnormality.   Original Report Authenticated By: Charlett Nose, M.D.      1. Dizziness of unknown cause     Room air saturation is 100% and I interpret this to be normal.  EKG at time time: 29 shows sinus bradycardia with occasional PACs at a rate of 55. Normal axis, normal intervals. Otherwise the EKG is normal. Other than the PA-C, no significant changes compared to EKG from 05/06/2011.  12:34 PM Basic lab tests, EKG and troponin are all within  normal limits. Head CT according to the radiologist shows no acute abnormalities. Patient continues to be symptom-free at this time. I spoke with the patient's primary care physician who will followup closely with the patient and agrees that an MRI of the brain scheduled for tomorrow is very reasonable to do and that she will followup with the results and discuss them with the patient.  MDM   Patient with subjective symptoms of dizziness. This does not sound like vertigo nor orthostasis. Patient has some stroke risk factors including hypertension and age, however symptoms lasting only 3-4 minutes several times a day for a week as not consistent with classic TIA symptoms. My plan for now is to obtain routine blood tests to check for anemia, likely deficiencies and obtain a head  CT. Patient and family understand that MRI capability is not here at present but will be available tomorrow. I think if workup is negative here, I will speak with the patient's primary care physician and arrange for a MRI of brain tomorrow as an outpatient.        Gavin Pound. Elonda Giuliano, MD 08/27/12 1236

## 2012-08-27 NOTE — ED Notes (Signed)
Pt reports "brief episodes" of dizziness, generalized weakness and "numbness to lips" intermittently for over a week. Pt sts last episode was last night at 08:45pm. Denies symptoms at present.

## 2012-08-27 NOTE — Discharge Instructions (Signed)
   Patient Information 2013 [employer].

## 2012-09-01 ENCOUNTER — Ambulatory Visit (HOSPITAL_BASED_OUTPATIENT_CLINIC_OR_DEPARTMENT_OTHER)
Admission: RE | Admit: 2012-09-01 | Discharge: 2012-09-01 | Disposition: A | Discharge status: Home / Self Care | Payer: Medicare Other | Source: Ambulatory Visit | Attending: Emergency Medicine | Admitting: Emergency Medicine

## 2012-09-01 DIAGNOSIS — I10 Essential (primary) hypertension: Secondary | ICD-10-CM | POA: Insufficient documentation

## 2012-09-01 DIAGNOSIS — R55 Syncope and collapse: Secondary | ICD-10-CM | POA: Diagnosis not present

## 2012-09-01 DIAGNOSIS — R4182 Altered mental status, unspecified: Secondary | ICD-10-CM | POA: Insufficient documentation

## 2012-09-01 DIAGNOSIS — R42 Dizziness and giddiness: Secondary | ICD-10-CM | POA: Diagnosis not present

## 2012-09-01 DIAGNOSIS — Z1389 Encounter for screening for other disorder: Secondary | ICD-10-CM

## 2012-09-01 DIAGNOSIS — Z135 Encounter for screening for eye and ear disorders: Secondary | ICD-10-CM | POA: Diagnosis not present

## 2012-09-03 ENCOUNTER — Telehealth: Payer: Self-pay | Admitting: Family Medicine

## 2012-09-03 NOTE — Telephone Encounter (Signed)
Patient is scheduled for [facility] 09/05/12 at 9:30

## 2012-09-03 NOTE — Telephone Encounter (Signed)
Patient was in the ED last week, had an MRI on Saturday and the physicians told him to schedule an appointment three days past MRI. When can I get this patient in to be seen? Thanks!

## 2012-09-03 NOTE — Telephone Encounter (Signed)
Schedule at [facility] Wed at 9:30

## 2012-09-05 ENCOUNTER — Ambulatory Visit (INDEPENDENT_AMBULATORY_CARE_PROVIDER_SITE_OTHER): Payer: Medicare Other | Admitting: Family Medicine

## 2012-09-05 ENCOUNTER — Encounter: Payer: Self-pay | Admitting: Family Medicine

## 2012-09-05 VITALS — BP 126/77 | HR 70 | Temp 97.8°F | Ht 75.0 in | Wt 238.1 lb

## 2012-09-05 DIAGNOSIS — I10 Essential (primary) hypertension: Secondary | ICD-10-CM | POA: Diagnosis not present

## 2012-09-05 DIAGNOSIS — G459 Transient cerebral ischemic attack, unspecified: Secondary | ICD-10-CM

## 2012-09-05 DIAGNOSIS — J019 Acute sinusitis, unspecified: Secondary | ICD-10-CM | POA: Insufficient documentation

## 2012-09-05 HISTORY — DX: Transient cerebral ischemic attack, unspecified: G45.9

## 2012-09-05 HISTORY — DX: Acute sinusitis, unspecified: J01.90

## 2012-09-05 MED ORDER — ASPIRIN 325 MG PO TBEC: 325.0000 mg | DELAYED_RELEASE_TABLET | Freq: Every day | ORAL | Status: DC

## 2012-09-05 MED ORDER — AMOXICILLIN-POT CLAVULANATE 875-125 MG PO TABS: 1.0000 | ORAL_TABLET | Freq: Two times a day (BID) | ORAL | Status: DC

## 2012-09-05 NOTE — Assessment & Plan Note (Signed)
Well controlled today, no changes 

## 2012-09-05 NOTE — Assessment & Plan Note (Signed)
Paresthesias right upper lip have resolved. Weakness and near falls have resolved. Agrees to referral to neurology for consideration of his MRi finding and possibility of narrowing of left vertebral artery. Increase Aspirin to 325 mg daily

## 2012-09-05 NOTE — Patient Instructions (Signed)
Probiotic, Digestive Advantage by [employer]  Transient Ischemic Attack A transient ischemic attack (TIA) is a "warning stroke" that causes stroke-like symptoms. Unlike a stroke, a TIA does not cause permanent damage to the brain. The symptoms of a TIA can happen very fast and do not last long. It is important to know the symptoms of a TIA and what to do. This can help prevent a major stroke or death. CAUSES   A TIA is caused by a temporary blockage in an artery in the brain or neck (carotid artery). The blockage does not allow the brain to get the blood supply it needs and can cause different symptoms. The blockage can be caused by either:  A blood clot.  Fatty buildup (plaque) in a neck or brain artery. SYMPTOMS  TIA symptoms are the same as a stroke but are temporary. Symptoms can include sudden:  Numbness or weakness on one side of the body. Especially to the:  Face.  Arm.  Leg.  Trouble speaking, thinking, or confusion.  Change in vision, such as trouble seeing in one or both eyes.  Dizziness, loss of balance, or difficulty walking.  Severe headache. ANY OF THESE SYMPTOMS MAY REPRESENT A SERIOUS PROBLEM THAT IS AN EMERGENCY. Do not wait to see if the symptoms will go away. Get medical help at once. Call your local emergency services (911 in [place] IMMEDIATELY. DO NOT drive yourself to the hospital. RISK FACTORS Risk factors can increase the risk of developing a TIA. These can include.   High blood pressure (hypertension).  High cholesterol (hyperlipidemia).  Heart disease (atherosclerosis).  Smoking.  Diabetes.  Abnormal heart rhythm (atrial fibrillation).  Family history of a stroke or heart attack.  Use of oral contraceptives (especially when combined with smoking). DIAGNOSIS   A TIA can be diagnosed based on your:  Symptoms.  History.  Risk factors.  Tests that can help diagnose the symptoms of a TIA include:  CT or MRI scan. These tests can provide  detailed images of the brain.  Carotid ultrasound. This test looks to see if there are blockages in the carotid arteries of your neck.  Arteriography. A thin, small flexible tube (catheter) is inserted through a small cut (incision) in your groin. The catheter is threaded to your carotid or vertebral artery. A dye is then injected into the catheter. The dye highlights the arteries in your brain and allows your caregiver to look for narrowing or blockages that can cause a TIA. TREATMENT  Based on the cause of a TIA, treatment options can vary. Treatment is important to help prevent a stroke. Treatment options can include:  Medication. Such as:  Clot-busting medicine.  Anti-platelet medicine.  Blood pressure medicine.  Blood thinner medicine.  Surgery:  Carotid endarterectomy. The carotid arteries are the arteries that supply the head and neck with oxygenated blood. This surgery can help remove fatty deposits (plaque) in the carotid arteries.  Angioplasty and stenting. This surgery uses a balloon to dilate a blocked artery in the brain. A stent is a small, metal mesh tube that can help keep an artery open HOME CARE INSTRUCTIONS   It is important to take all medicine as told by your caregiver. If the medicine has side effects that affect you negatively, tell your caregiver right away. Do not stop taking medicine unless told by your caregiver. Some medicines may need to be changed to better treat your condition.  Do not smoke. Talk to your caregiver on how to quit smoking.  Eat a diet high in fruits, vegetables and lean meat. Avoid a high fat, high salt diet. A dietician can you help you make healthy food choices.  Maintain a healthy weight. Develop an exercise plan approved by your caregiver. SEEK IMMEDIATE MEDICAL CARE IF:   You develop weakness or numbness on one side of your body.  You have problems thinking, speaking, or feel confused.  You have vision changes.  You feel  dizzy, have trouble walking, or lose your balance.  You develop a severe headache. MAKE SURE YOU:   Understand these instructions.  Will watch your condition.  Will get help right away if you are not doing well or get worse. Document Released: 03/30/2005 Document Revised: 09/12/2011 Document Reviewed: 08/13/2009 [facility] Patient Information 2013 [place], [place].

## 2012-09-05 NOTE — Progress Notes (Signed)
Patient ID: Anthony Poole, male   DOB: [birth year], 72 y.o.   MRN: [id number] Anthony Poole [id number] 10/14/1940 09/05/2012      Progress Note-Follow Up  Subjective  Chief Complaint  Chief Complaint  Patient presents with  . Follow-up    ED follow up     HPI  Patient is a 72 year old Caucasian male who is in today in followup. He was recently seen in ER for some paresthesias in his right upper lip and some phleboliths disequilibrium and falling. He had a sense of feeling lightheaded and woozy. Those symptoms have resolved. He denies headache or fevers at this time. Has been noting some mild right lower quadrant pain which seems to be improved somewhat at night. No change in bowels or bladder habits. No diarrhea or constipation. No urinary or dysuria. No chest pain or palpitations. No shortness or breath GI or GU complaints otherwise noted today. No vision or hearing changes no other neurologic complaints noted at this time.  Past Medical History  Diagnosis Date  . Hypertension   . GERD (gastroesophageal reflux disease)   . Preventative health care 07/29/2012  . Anemia 07/29/2012  . Sinusitis, acute 09/05/2012    Past Surgical History  Procedure Laterality Date  . Cholecystectomy      History reviewed. No pertinent family history.  History   Social History  . Marital Status: Married    Spouse Name: N/A    Number of Children: N/A  . Years of Education: N/A   Occupational History  . Not on file.   Social History Main Topics  . Smoking status: Never Smoker   . Smokeless tobacco: Not on file  . Alcohol Use: No  . Drug Use: No  . Sexually Active:    Other Topics Concern  . Not on file   Social History Narrative  . No narrative on file    Current Outpatient Prescriptions on File Prior to Visit  Medication Sig Dispense Refill  . amLODipine (NORVASC) 5 MG tablet Take 1 tablet (5 mg total) by mouth daily.  30 tablet  6  . aspirin 81 MG tablet Take 81 mg by mouth  daily.      Marland Kitchen esomeprazole (NEXIUM) 40 MG capsule Take 1 capsule (40 mg total) by mouth daily.  30 capsule  3  . lisinopril (PRINIVIL,ZESTRIL) 40 MG tablet Take 1 tablet (40 mg total) by mouth daily.  30 tablet  6  . Misc Natural Products (OSTEO BI-FLEX ADV JOINT SHIELD PO) Take by mouth daily.      . Multiple Vitamins-Minerals (CENTRUM SILVER ADULT 50+ PO) Take by mouth daily.       No current facility-administered medications on file prior to visit.    Allergies  Allergen Reactions  . Fish Allergy Itching    Review of Systems  Review of Systems  Constitutional: Negative for fever and malaise/fatigue.  HENT: Negative for congestion.   Eyes: Negative for discharge.  Respiratory: Negative for shortness of breath.   Cardiovascular: Negative for chest pain, palpitations and leg swelling.  Gastrointestinal: Positive for abdominal pain. Negative for nausea and diarrhea.  Genitourinary: Negative for dysuria.  Musculoskeletal: Negative for falls.  Skin: Negative for rash.  Neurological: Positive for dizziness and sensory change. Negative for loss of consciousness and headaches.  Endo/Heme/Allergies: Negative for polydipsia.  Psychiatric/Behavioral: Negative for depression and suicidal ideas. The patient is not nervous/anxious and does not have insomnia.     Objective  BP 126/77  Pulse 70  Temp(Src) 97.8 F (36.6 C) (Temporal)  Ht 6\' 3"  (1.905 m)  Wt 238 lb 1.9 oz (108.011 kg)  BMI 29.76 kg/m2  SpO2 96%  Physical Exam  Physical Exam  Constitutional: He is oriented to person, place, and time and well-developed, well-nourished, and in no distress. No distress.  HENT:  Head: Normocephalic and atraumatic.  Eyes: Conjunctivae are normal.  Neck: Neck supple. No thyromegaly present.  Cardiovascular: Normal rate, regular rhythm and normal heart sounds.   Pulmonary/Chest: Effort normal and breath sounds normal. No respiratory distress.  Abdominal: He exhibits no distension and no  mass. There is no tenderness.  Musculoskeletal: He exhibits no edema.  Neurological: He is alert and oriented to person, place, and time. He has normal reflexes. No cranial nerve deficit. Gait normal. Coordination normal.  Skin: Skin is warm.  Psychiatric: Memory, affect and judgment normal.    Lab Results  Component Value Date   TSH 1.232 04/02/2012   Lab Results  Component Value Date   WBC 5.2 08/27/2012   HGB 14.6 08/27/2012   HCT 42.6 08/27/2012   MCV 93.4 08/27/2012   PLT 209 08/27/2012   Lab Results  Component Value Date   CREATININE 1.20 08/27/2012   BUN 15 08/27/2012   NA 140 08/27/2012   K 4.4 08/27/2012   CL 105 08/27/2012   CO2 26 08/27/2012   Lab Results  Component Value Date   ALT 21 08/27/2012   AST 23 08/27/2012   ALKPHOS 76 08/27/2012   BILITOT 0.7 08/27/2012     Assessment & Plan  TIA (transient ischemic attack) Paresthesias right upper lip have resolved. Weakness and near falls have resolved. Agrees to referral to neurology for consideration of his MRi finding and possibility of narrowing of left vertebral artery. Increase Aspirin to 325 mg daily  Unspecified essential hypertension Well controlled today, no changes  Sinusitis, acute Augmentin, Mucinex and probiotics prescribed today

## 2012-09-05 NOTE — Assessment & Plan Note (Signed)
Augmentin, Mucinex and probiotics prescribed today

## 2012-09-19 ENCOUNTER — Encounter: Payer: Self-pay | Admitting: Neurology

## 2012-09-19 ENCOUNTER — Ambulatory Visit (INDEPENDENT_AMBULATORY_CARE_PROVIDER_SITE_OTHER): Payer: Medicare Other | Admitting: Neurology

## 2012-09-19 VITALS — BP 126/70 | HR 64 | Temp 97.7°F | Resp 12 | Ht 75.0 in | Wt 238.0 lb

## 2012-09-19 DIAGNOSIS — G459 Transient cerebral ischemic attack, unspecified: Secondary | ICD-10-CM | POA: Diagnosis not present

## 2012-09-19 NOTE — Progress Notes (Signed)
Anthony Poole is a 72 year old active and retired male with 3 children.  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  On February 15 in the afternoon, he went to the front door with a post man had left the package.  For less than a minute, he experienced tingling of the right upper lip and a weak kneed feeling.  He sat down and it cleared up in less than a minute and his wife said he didn't look out of the ordinary in terms of color or eyes or communication ability.  He had a second episode sitting down that was similar in terms of the right upper lip tingling for less than a minute. Since he was Re: sitting down, he did not feel weak in the knees.  Nearly end of February, he had a third episode when he walked into the kitchen. He leaned up against the bar for a few seconds and then it went away.  Next a total slightly doctor's appointment and he mentioned it. They suggested he go to the ER. He had a CAT scan and EKG and blood work everything was negative. He was scheduled for MRI on March 1.  I reviewed the images and they don't look very impressive to me. They're essentially normal for a person of this age. The report states that he does have some atrophy and perhaps some changes of small vessel disease. These are pretty subtle findings. There was also mention that the right vertebral artery is dominant and this could be a congenital variation versus other acquired cause of decreased flow in left vertebral. However this was not an MRA study.  He was told to increase his aspirin from 81 mg a day to 325 mg a day. His now been about 3 weeks he has had no further episodes. He was also treated for sinusitis with antibiotics. He had a lingering cold over the long winter with some colored mucus. He is feeling better but he still has a sore throat.  He is generally pretty happy with his wife feels he could be a little bit  depressed. His relationship with his favorite daughter and the things that are going on in her life does trouble him.  Review of systems is positive for occasional acid reflux, pain and tingling in the feet, sore throat and recent sinus infection. Remainder of the review of systems is negative.  Past Medical History  Diagnosis Date  . Hypertension   . GERD (gastroesophageal reflux disease)   . Preventative health care 07/29/2012  . Anemia 07/29/2012  . Sinusitis, acute 09/05/2012    Current Outpatient Prescriptions on File Prior to Visit  Medication Sig Dispense Refill  . amLODipine (NORVASC) 5 MG tablet Take 1 tablet (5 mg total) by mouth daily.  30 tablet  6  . amoxicillin-clavulanate (AUGMENTIN) 875-125 MG per tablet Take 1 tablet by mouth 2 (two) times daily.  28 tablet  0  . aspirin 325 MG EC tablet Take 1 tablet (325 mg total) by mouth daily.      Marland Kitchen esomeprazole (NEXIUM) 40 MG capsule Take 1 capsule (40 mg total) by mouth daily.  30 capsule  3  . lisinopril (PRINIVIL,ZESTRIL) 40 MG tablet Take 1 tablet (40 mg total) by mouth daily.  30 tablet  6  . MAGNESIUM PO Take by mouth daily.      . Misc Natural Products (OSTEO BI-FLEX ADV JOINT  SHIELD PO) Take by mouth daily.      . Multiple Vitamins-Minerals (CENTRUM SILVER ADULT 50+ PO) Take by mouth daily.       No current facility-administered medications on file prior to visit.   Fish allergy and Propulsid  History   Social History  . Marital Status: Married    Spouse Name: N/A    Number of Children: N/A  . Years of Education: N/A   Occupational History  . Not on file.   Social History Main Topics  . Smoking status: Never Smoker   . Smokeless tobacco: Never Used  . Alcohol Use: No  . Drug Use: No  . Sexually Active: Not on file   Other Topics Concern  . Not on file   Social History Narrative  . No narrative on file   No family history on file.   BP 126/70  Pulse 64  Temp(Src) 97.7 F (36.5 C)  Resp 12  Ht 6\' 3"   (1.905 m)  Wt 238 lb (107.956 kg)  BMI 29.75 kg/m2   Alert and oriented x 3.  Memory function appears to be intact.  Concentration and attention are normal for educational level and background.  Speech is fluent and without significant word finding difficulty.  Is aware of current events.  No carotid bruits detected.  Cranial nerve II through XII are within normal limits.  This includes normal optic discs and acuity, EOMI, PERLA, facial movement and sensation intact, hearing grossly intact, gag intact,Uvula raises symmetrically and tongue protrudes evenly.  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 Motor strength is 5 over 5 throughout all limbs.  No atrophy, abnormal tone or tremors. Reflexes are 2+ and symmetric in the upper and lower extremities except trace to 1+ at the ankles Sensory exam is intact except decreased vibration and pin in the toes. Coordination is intact for fine movements and rapid alternating movements in all limbs Gait and station are normal.   Impression: 3 episodes of right upper lip numbness.  MRI is, in my opinion, farily normal for age.  He has not had any for 3 weeks.  He is now on asa 325, increased from 81 mg and thus far he is tolerating this well.  Coated ASA recommended.   Potential causes are TIA, vs effects of sinusitis which has also recently received treatment for, or other.  The subtle facial assymmetry appears to be at least several years old.  Plan: He will continue his regimen and return in 2-3 months.  If the spells resume, we will re-evaluate, or he can go to ER if an acute episode progresses. If the ASA starts to bother him, we can consider plavix.

## 2012-09-25 ENCOUNTER — Other Ambulatory Visit: Payer: Self-pay | Admitting: Internal Medicine

## 2012-09-25 NOTE — Telephone Encounter (Signed)
Rx request to pharmacy/[facility]  

## 2012-10-29 ENCOUNTER — Other Ambulatory Visit: Payer: Self-pay | Admitting: Internal Medicine

## 2012-10-29 NOTE — Telephone Encounter (Signed)
Rx request to pharmacy/[facility]  

## 2012-11-14 ENCOUNTER — Other Ambulatory Visit: Payer: Self-pay | Admitting: Internal Medicine

## 2012-11-14 NOTE — Telephone Encounter (Signed)
Rx sent in to pharmacy. 

## 2012-11-19 ENCOUNTER — Encounter: Payer: Self-pay | Admitting: Family Medicine

## 2012-11-19 ENCOUNTER — Ambulatory Visit (INDEPENDENT_AMBULATORY_CARE_PROVIDER_SITE_OTHER): Payer: Medicare Other | Admitting: Family Medicine

## 2012-11-19 VITALS — BP 140/82 | HR 56 | Temp 97.7°F | Ht 75.0 in | Wt 235.1 lb

## 2012-11-19 DIAGNOSIS — M25562 Pain in left knee: Secondary | ICD-10-CM

## 2012-11-19 DIAGNOSIS — M25569 Pain in unspecified knee: Secondary | ICD-10-CM | POA: Diagnosis not present

## 2012-11-19 DIAGNOSIS — K219 Gastro-esophageal reflux disease without esophagitis: Secondary | ICD-10-CM | POA: Diagnosis not present

## 2012-11-19 DIAGNOSIS — I10 Essential (primary) hypertension: Secondary | ICD-10-CM

## 2012-11-19 NOTE — Progress Notes (Signed)
Anthony Poole [id number] [birth year] 11/19/2012      Progress Note-Follow Up  Subjective  Chief Complaint  Chief Complaint  Patient presents with  . Follow-up    4 month    HPI  Patient  Is a 72 year old male in today for follow up. Doing well present time. Needs refill that he is complaining of persistent left knee pain. He does have a history of cartilage damage to his knee but no recent injury or fall. No redness or warmth. Patient denies chest pain, palpitations, shortness of breath, GI or GU complaints.  Past Medical History  Diagnosis Date  . Hypertension   . GERD (gastroesophageal reflux disease)   . Preventative health care 07/29/2012  . Anemia 07/29/2012  . Sinusitis, acute 09/05/2012    Past Surgical History  Procedure Laterality Date  . Cholecystectomy      History reviewed. No pertinent family history.  History   Social History  . Marital Status: Married    Spouse Name: N/A    Number of Children: N/A  . Years of Education: N/A   Occupational History  . Not on file.   Social History Main Topics  . Smoking status: Never Smoker   . Smokeless tobacco: Never Used  . Alcohol Use: No  . Drug Use: No  . Sexually Active: Not on file   Other Topics Concern  . Not on file   Social History Narrative  . No narrative on file    Current Outpatient Prescriptions on File Prior to Visit  Medication Sig Dispense Refill  . amLODipine (NORVASC) 5 MG tablet Take 1 tablet (5 mg total) by mouth daily.  30 tablet  6  . aspirin 325 MG EC tablet Take 1 tablet (325 mg total) by mouth daily.      Marland Kitchen lisinopril (PRINIVIL,ZESTRIL) 40 MG tablet TAKE 1 TABLET (40 MG TOTAL) BY MOUTH DAILY.  30 tablet  5  . MAGNESIUM PO Take by mouth daily.      . Misc Natural Products (OSTEO BI-FLEX ADV JOINT SHIELD PO) Take by mouth daily.      . Multiple Vitamins-Minerals (CENTRUM SILVER ADULT 50+ PO) Take by mouth daily.      Marland Kitchen NEXIUM 40 MG capsule TAKE 1 CAPSULE BY MOUTH EVERY DAY  30  capsule  3   No current facility-administered medications on file prior to visit.    Allergies  Allergen Reactions  . Fish Allergy Itching  . Propulsid (Cisapride) Hives    Review of Systems  Review of Systems  Constitutional: Negative for fever and malaise/fatigue.  HENT: Negative for congestion.   Eyes: Negative for discharge.  Respiratory: Negative for shortness of breath.   Cardiovascular: Negative for chest pain, palpitations and leg swelling.  Gastrointestinal: Negative for nausea, abdominal pain and diarrhea.  Genitourinary: Negative for dysuria.  Musculoskeletal: Negative for falls.  Skin: Negative for rash.  Neurological: Negative for loss of consciousness and headaches.  Endo/Heme/Allergies: Negative for polydipsia.  Psychiatric/Behavioral: Negative for depression and suicidal ideas. The patient is not nervous/anxious and does not have insomnia.     Objective  BP 140/82  Pulse 56  Temp(Src) 97.7 F (36.5 C) (Oral)  Ht 6\' 3"  (1.905 m)  Wt 235 lb 1.9 oz (106.65 kg)  BMI 29.39 kg/m2  SpO2 98%  Physical Exam  Physical Exam  Constitutional: He is oriented to person, place, and time and well-developed, well-nourished, and in no distress. No distress.  HENT:  Head: Normocephalic and atraumatic.  Eyes: Conjunctivae  are normal.  Neck: Neck supple. No thyromegaly present.  Cardiovascular: Normal rate, regular rhythm and normal heart sounds.   No murmur heard. Pulmonary/Chest: Effort normal and breath sounds normal. No respiratory distress.  Abdominal: He exhibits no distension and no mass. There is no tenderness.  Musculoskeletal: He exhibits no edema.  Neurological: He is alert and oriented to person, place, and time.  Skin: Skin is warm.  Psychiatric: Memory, affect and judgment normal.    Lab Results  Component Value Date   TSH 1.232 04/02/2012   Lab Results  Component Value Date   WBC 5.2 08/27/2012   HGB 14.6 08/27/2012   HCT 42.6 08/27/2012   MCV  93.4 08/27/2012   PLT 209 08/27/2012   Lab Results  Component Value Date   CREATININE 1.20 08/27/2012   BUN 15 08/27/2012   NA 140 08/27/2012   K 4.4 08/27/2012   CL 105 08/27/2012   CO2 26 08/27/2012   Lab Results  Component Value Date   ALT 21 08/27/2012   AST 23 08/27/2012   ALKPHOS 76 08/27/2012   BILITOT 0.7 08/27/2012     Assessment & Plan  Chronic knee pain May try Aspercreme and add Flaxseed oil caps for joint health. Maintain activity as tolerated  Unspecified essential hypertension Well controlled today no changes.  GERD (gastroesophageal reflux disease) Adequate control on Nexium, avoid offending foods

## 2012-11-19 NOTE — Patient Instructions (Addendum)
Start Flaxseed oil caps, 2 caps daily Consider a probiotic daily such as Digestive Advantage or Align  Hypertension As your heart beats, it forces blood through your arteries. This force is your blood pressure. If the pressure is too high, it is called hypertension (HTN) or high blood pressure. HTN is dangerous because you may have it and not know it. High blood pressure may mean that your heart has to work harder to pump blood. Your arteries may be narrow or stiff. The extra work puts you at risk for heart disease, stroke, and other problems.  Blood pressure consists of two numbers, a higher number over a lower, 110/72, for example. It is stated as "110 over 72." The ideal is below 120 for the top number (systolic) and under 80 for the bottom (diastolic). Write down your blood pressure today. You should pay close attention to your blood pressure if you have certain conditions such as:  Heart failure.  Prior heart attack.  Diabetes  Chronic kidney disease.  Prior stroke.  Multiple risk factors for heart disease. To see if you have HTN, your blood pressure should be measured while you are seated with your arm held at the level of the heart. It should be measured at least twice. A one-time elevated blood pressure reading (especially in the Emergency Department) does not mean that you need treatment. There may be conditions in which the blood pressure is different between your right and left arms. It is important to see your caregiver soon for a recheck. Most people have essential hypertension which means that there is not a specific cause. This type of high blood pressure may be lowered by changing lifestyle factors such as:  Stress.  Smoking.  Lack of exercise.  Excessive weight.  Drug/tobacco/alcohol use.  Eating less salt. Most people do not have symptoms from high blood pressure until it has caused damage to the body. Effective treatment can often prevent, delay or reduce that  damage. TREATMENT  When a cause has been identified, treatment for high blood pressure is directed at the cause. There are a large number of medications to treat HTN. These fall into several categories, and your caregiver will help you select the medicines that are best for you. Medications may have side effects. You should review side effects with your caregiver. If your blood pressure stays high after you have made lifestyle changes or started on medicines,   Your medication(s) may need to be changed.  Other problems may need to be addressed.  Be certain you understand your prescriptions, and know how and when to take your medicine.  Be sure to follow up with your caregiver within the time frame advised (usually within two weeks) to have your blood pressure rechecked and to review your medications.  If you are taking more than one medicine to lower your blood pressure, make sure you know how and at what times they should be taken. Taking two medicines at the same time can result in blood pressure that is too low. SEEK IMMEDIATE MEDICAL CARE IF:  You develop a severe headache, blurred or changing vision, or confusion.  You have unusual weakness or numbness, or a faint feeling.  You have severe chest or abdominal pain, vomiting, or breathing problems. MAKE SURE YOU:   Understand these instructions.  Will watch your condition.  Will get help right away if you are not doing well or get worse. Document Released: 06/20/2005 Document Revised: 09/12/2011 Document Reviewed: 02/08/2008 [employer] Patient Information 2013  [employer]

## 2012-11-21 ENCOUNTER — Encounter: Payer: Self-pay | Admitting: Family Medicine

## 2012-11-21 DIAGNOSIS — G8929 Other chronic pain: Secondary | ICD-10-CM | POA: Insufficient documentation

## 2012-11-21 DIAGNOSIS — M25561 Pain in right knee: Secondary | ICD-10-CM

## 2012-11-21 HISTORY — DX: Pain in right knee: M25.561

## 2012-11-21 HISTORY — DX: Other chronic pain: G89.29

## 2012-11-21 NOTE — Assessment & Plan Note (Signed)
Well controlled today no changes 

## 2012-11-21 NOTE — Assessment & Plan Note (Signed)
May try Aspercreme and add Flaxseed oil caps for joint health. Maintain activity as tolerated

## 2012-11-21 NOTE — Assessment & Plan Note (Signed)
Adequate control on Nexium, avoid offending foods 

## 2012-12-01 DIAGNOSIS — I889 Nonspecific lymphadenitis, unspecified: Secondary | ICD-10-CM | POA: Diagnosis not present

## 2012-12-06 ENCOUNTER — Ambulatory Visit: Payer: Medicare Other | Admitting: Family Medicine

## 2012-12-08 ENCOUNTER — Other Ambulatory Visit: Payer: Self-pay | Admitting: Internal Medicine

## 2012-12-10 ENCOUNTER — Encounter: Payer: Self-pay | Admitting: Family Medicine

## 2012-12-10 ENCOUNTER — Ambulatory Visit (INDEPENDENT_AMBULATORY_CARE_PROVIDER_SITE_OTHER): Payer: Medicare Other | Admitting: Family Medicine

## 2012-12-10 VITALS — BP 110/76 | HR 76 | Temp 98.0°F | Ht 75.0 in | Wt 238.0 lb

## 2012-12-10 DIAGNOSIS — J329 Chronic sinusitis, unspecified: Secondary | ICD-10-CM | POA: Diagnosis not present

## 2012-12-10 DIAGNOSIS — B9789 Other viral agents as the cause of diseases classified elsewhere: Secondary | ICD-10-CM

## 2012-12-10 DIAGNOSIS — B029 Zoster without complications: Secondary | ICD-10-CM | POA: Diagnosis not present

## 2012-12-10 DIAGNOSIS — I10 Essential (primary) hypertension: Secondary | ICD-10-CM

## 2012-12-10 DIAGNOSIS — B349 Viral infection, unspecified: Secondary | ICD-10-CM

## 2012-12-10 MED ORDER — ACYCLOVIR 400 MG PO TABS: 400.0000 mg | ORAL_TABLET | Freq: Every day | ORAL | Status: DC

## 2012-12-10 MED ORDER — DOXYCYCLINE HYCLATE 100 MG PO TBEC: 100.0000 mg | DELAYED_RELEASE_TABLET | Freq: Two times a day (BID) | ORAL | Status: DC

## 2012-12-10 NOTE — Progress Notes (Signed)
Patient ID: Anthony Poole, male   DOB: 02-Feb-1941, 72 y.o.   MRN: [id number] Anthony Poole [id number] 1941-03-17 12/10/2012      Progress Note-Follow Up  Subjective  Chief Complaint  Chief Complaint  Patient presents with  . pain behind ear    right ear    HPI  Patient 72 yo male in today for evaluation of painful lesion behind right ear. Has some vesicles on surface with some burning and itching. He was seen at Urgent care and started on an antibiotic and antiviral. No fevers, malaise or myalgias, some pain down his right neck has occurred over the past few days. No HA/vision changes/CP/palp/SOB/GI c/o.  Past Medical History  Diagnosis Date  . Hypertension   . GERD (gastroesophageal reflux disease)   . Preventative health care 07/29/2012  . Anemia 07/29/2012  . Sinusitis, acute 09/05/2012  . Chronic knee pain 11/21/2012    Left s/p torn cartilage    Past Surgical History  Procedure Laterality Date  . Cholecystectomy      History reviewed. No pertinent family history.  History   Social History  . Marital Status: Married    Spouse Name: N/A    Number of Children: N/A  . Years of Education: N/A   Occupational History  . Not on file.   Social History Main Topics  . Smoking status: Never Smoker   . Smokeless tobacco: Never Used  . Alcohol Use: No  . Drug Use: No  . Sexually Active: Not on file   Other Topics Concern  . Not on file   Social History Narrative  . No narrative on file    Current Outpatient Prescriptions on File Prior to Visit  Medication Sig Dispense Refill  . amLODipine (NORVASC) 5 MG tablet Take 1 tablet (5 mg total) by mouth daily.  30 tablet  6  . amLODipine (NORVASC) 5 MG tablet Take 1 tablet (5 mg total) by mouth daily.  30 tablet  5  . aspirin 325 MG EC tablet Take 1 tablet (325 mg total) by mouth daily.      Marland Kitchen esomeprazole (NEXIUM) 40 MG capsule Take 1 capsule (40 mg total) by mouth daily before breakfast.  30 capsule  5  . lisinopril  (PRINIVIL,ZESTRIL) 40 MG tablet TAKE 1 TABLET (40 MG TOTAL) BY MOUTH DAILY.  30 tablet  5  . lisinopril (PRINIVIL,ZESTRIL) 40 MG tablet Take 1 tablet (40 mg total) by mouth daily.  30 tablet  5  . MAGNESIUM PO Take by mouth daily.      . Misc Natural Products (OSTEO BI-FLEX ADV JOINT SHIELD PO) Take by mouth daily.      . Multiple Vitamins-Minerals (CENTRUM SILVER ADULT 50+ PO) Take by mouth daily.      Marland Kitchen NEXIUM 40 MG capsule TAKE 1 CAPSULE BY MOUTH EVERY DAY  30 capsule  3   No current facility-administered medications on file prior to visit.    Allergies  Allergen Reactions  . Fish Allergy Itching  . Propulsid (Cisapride) Hives    Review of Systems  Review of Systems  Constitutional: Negative for fever and malaise/fatigue.  HENT: Positive for ear pain. Negative for congestion.   Eyes: Negative for discharge.  Respiratory: Negative for shortness of breath.   Cardiovascular: Negative for chest pain, palpitations and leg swelling.  Gastrointestinal: Negative for nausea, abdominal pain and diarrhea.  Genitourinary: Negative for dysuria.  Musculoskeletal: Negative for falls.  Skin: Positive for itching and rash.  Neurological: Negative for  loss of consciousness and headaches.  Endo/Heme/Allergies: Negative for polydipsia.  Psychiatric/Behavioral: Negative for depression and suicidal ideas. The patient is not nervous/anxious and does not have insomnia.     Objective  BP 110/76  Pulse 76  Temp(Src) 98 F (36.7 C) (Oral)  Ht 6\' 3"  (1.905 m)  Wt 238 lb (107.956 kg)  BMI 29.75 kg/m2  SpO2 96%  Physical Exam  Physical Exam  Constitutional: He is oriented to person, place, and time and well-developed, well-nourished, and in no distress. No distress.  HENT:  Head: Normocephalic and atraumatic.  Eyes: Conjunctivae are normal.  Neck: Neck supple. No thyromegaly present.  Cardiovascular: Normal rate, regular rhythm and normal heart sounds.   No murmur heard. Pulmonary/Chest:  Effort normal and breath sounds normal. No respiratory distress.  Abdominal: He exhibits no distension and no mass. There is no tenderness.  Musculoskeletal: He exhibits no edema.  Neurological: He is alert and oriented to person, place, and time.  Skin: Skin is warm. Rash noted. There is erythema.  Raised red patch along hair line behind right ear and cold sore noted at corner of mouth on right  Psychiatric: Memory, affect and judgment normal.    Lab Results  Component Value Date   TSH 1.232 04/02/2012   Lab Results  Component Value Date   WBC 5.2 08/27/2012   HGB 14.6 08/27/2012   HCT 42.6 08/27/2012   MCV 93.4 08/27/2012   PLT 209 08/27/2012   Lab Results  Component Value Date   CREATININE 1.20 08/27/2012   BUN 15 08/27/2012   NA 140 08/27/2012   K 4.4 08/27/2012   CL 105 08/27/2012   CO2 26 08/27/2012   Lab Results  Component Value Date   ALT 21 08/27/2012   AST 23 08/27/2012   ALKPHOS 76 08/27/2012   BILITOT 0.7 08/27/2012     Assessment & Plan  Unspecified essential hypertension Well controlled no changes to meds  Shingles Behind right ear. Given rx for Acyclovir and Doxycycline and asked to start probiotics

## 2012-12-10 NOTE — Patient Instructions (Addendum)
Rel of rec Dr Christell Constant, [facility] need immunizations, labs and images  Shingles Shingles (herpes zoster) is an infection that is caused by the same virus that causes chickenpox (varicella). The infection causes a painful skin rash and fluid-filled blisters, which eventually break open, crust over, and heal. It may occur in any area of the body, but it usually affects only one side of the body or face. The pain of shingles usually lasts about 1 month. However, some people with shingles may develop long-term (chronic) pain in the affected area of the body. Shingles often occurs many years after the person had chickenpox. It is more common:  In people older than 50 years.  In people with weakened immune systems, such as those with HIV, AIDS, or cancer.  In people taking medicines that weaken the immune system, such as transplant medicines.  In people under great stress. CAUSES  Shingles is caused by the varicella zoster virus (VZV), which also causes chickenpox. After a person is infected with the virus, it can remain in the person's body for years in an inactive state (dormant). To cause shingles, the virus reactivates and breaks out as an infection in a nerve root. The virus can be spread from person to person (contagious) through contact with open blisters of the shingles rash. It will only spread to people who have not had chickenpox. When these people are exposed to the virus, they may develop chickenpox. They will not develop shingles. Once the blisters scab over, the person is no longer contagious and cannot spread the virus to others. SYMPTOMS  Shingles shows up in stages. The initial symptoms may be pain, itching, and tingling in an area of the skin. This pain is usually described as burning, stabbing, or throbbing.In a few days or weeks, a painful red rash will appear in the area where the pain, itching, and tingling were felt. The rash is usually on one side of the body in a band or  belt-like pattern. Then, the rash usually turns into fluid-filled blisters. They will scab over and dry up in approximately 2 3 weeks. Flu-like symptoms may also occur with the initial symptoms, the rash, or the blisters. These may include:  Fever.  Chills.  Headache.  Upset stomach. DIAGNOSIS  Your caregiver will perform a skin exam to diagnose shingles. Skin scrapings or fluid samples may also be taken from the blisters. This sample will be examined under a microscope or sent to a lab for further testing. TREATMENT  There is no specific cure for shingles. Your caregiver will likely prescribe medicines to help you manage the pain, recover faster, and avoid long-term problems. This may include antiviral drugs, anti-inflammatory drugs, and pain medicines. HOME CARE INSTRUCTIONS   Take a cool bath or apply cool compresses to the area of the rash or blisters as directed. This may help with the pain and itching.   Only take over-the-counter or prescription medicines as directed by your caregiver.   Rest as directed by your caregiver.  Keep your rash and blisters clean with mild soap and cool water or as directed by your caregiver.  Do not pick your blisters or scratch your rash. Apply an anti-itch cream or numbing creams to the affected area as directed by your caregiver.  Keep your shingles rash covered with a loose bandage (dressing).  Avoid skin contact with:  Babies.   Pregnant women.   Children with eczema.   Elderly people with transplants.   People with chronic illnesses, such  as leukemia or AIDS.   Wear loose-fitting clothing to help ease the pain of material rubbing against the rash.  Keep all follow-up appointments with your caregiver.If the area involved is on your face, you may receive a referral for follow-up to a specialist, such as an eye doctor (ophthalmologist) or an ear, nose, and throat (ENT) doctor. Keeping all follow-up appointments will help you  avoid eye complications, chronic pain, or disability.  SEEK IMMEDIATE MEDICAL CARE IF:   You have facial pain, pain around the eye area, or loss of feeling on one side of your face.  You have ear pain or ringing in your ear.  You have loss of taste.  Your pain is not relieved with prescribed medicines.   Your redness or swelling spreads.   You have more pain and swelling.  Your condition is worsening or has changed.   You have a feveror persistent symptoms for more than 2 3 days.  You have a fever and your symptoms suddenly get worse. MAKE SURE YOU:  Understand these instructions.  Will watch your condition.  Will get help right away if you are not doing well or get worse. Document Released: 06/20/2005 Document Revised: 03/14/2012 Document Reviewed: 02/02/2012 [facility] Patient Information 2014 [place], [place].

## 2012-12-12 ENCOUNTER — Encounter: Payer: Self-pay | Admitting: Family Medicine

## 2012-12-12 DIAGNOSIS — B029 Zoster without complications: Secondary | ICD-10-CM | POA: Insufficient documentation

## 2012-12-12 HISTORY — DX: Zoster without complications: B02.9

## 2012-12-12 NOTE — Assessment & Plan Note (Addendum)
Behind right ear. Given rx for Acyclovir and Doxycycline and asked to start probiotics

## 2012-12-12 NOTE — Assessment & Plan Note (Signed)
Well controlled no changes to meds 

## 2012-12-20 ENCOUNTER — Telehealth: Payer: Self-pay | Admitting: Family Medicine

## 2012-12-20 NOTE — Telephone Encounter (Signed)
Received medical records from [facility]-Dr. Malachy Chamber: [phone] F: [phone]

## 2012-12-24 ENCOUNTER — Ambulatory Visit: Payer: Medicare Other | Admitting: Neurology

## 2012-12-25 ENCOUNTER — Encounter: Payer: Self-pay | Admitting: Neurology

## 2012-12-25 ENCOUNTER — Ambulatory Visit (INDEPENDENT_AMBULATORY_CARE_PROVIDER_SITE_OTHER): Payer: Medicare Other | Admitting: Neurology

## 2012-12-25 VITALS — BP 106/60 | HR 64 | Temp 97.9°F | Ht 75.0 in | Wt 233.0 lb

## 2012-12-25 DIAGNOSIS — R200 Anesthesia of skin: Secondary | ICD-10-CM

## 2012-12-25 DIAGNOSIS — R209 Unspecified disturbances of skin sensation: Secondary | ICD-10-CM | POA: Diagnosis not present

## 2012-12-25 NOTE — Patient Instructions (Addendum)
Follow up with your PCP as needed.  Continue taking the Aspirin 81 mg (two a day).  Have a nice summer!

## 2012-12-25 NOTE — Progress Notes (Signed)
Anthony Poole returns for followup of 3 episodes of right upper lip numbness occurring in early March.  Around that time he did have sinusitis and may have had some pressure in the maxillary regions.  There was also suspicion that it could have been a TIA event.  His workup was basically unrevealing and he increased from aspirin 81 2 aspirin 325.  He has had no further episodes of sinusitis is also cleared.  He since reduced to aspirin 162 mg due to mild indigestion.  He is doing well now.  He had a possible mild case of shingles in the right C3 area, which has resolved with acyclovir.  He was also given additional antibiotics for broad coverage.  He does have some noted facial asymmetry which has not changed. In reviewing his old drivers license, this was apparently present a few years ago and is of unknown cause and duration.  Review of systems is positive for occasional word finding difficulty or memory lapse.  He also has had occasional indigestion.  He had a recent episode of possible shingles in the right posterior upper neck area also associated with some sensitivity of the skin near the right collarbone.  Past Medical History  Diagnosis Date  . Hypertension   . GERD (gastroesophageal reflux disease)   . Preventative health care 07/29/2012  . Anemia 07/29/2012  . Sinusitis, acute 09/05/2012  . Chronic knee pain 11/21/2012    Left s/p torn cartilage  . Shingles 12/12/2012    Current Outpatient Prescriptions on File Prior to Visit  Medication Sig Dispense Refill  . amLODipine (NORVASC) 5 MG tablet Take 1 tablet (5 mg total) by mouth daily.  30 tablet  6  . amLODipine (NORVASC) 5 MG tablet Take 1 tablet (5 mg total) by mouth daily.  30 tablet  5  . esomeprazole (NEXIUM) 40 MG capsule Take 1 capsule (40 mg total) by mouth daily before breakfast.  30 capsule  5  . lisinopril (PRINIVIL,ZESTRIL) 40 MG tablet TAKE 1 TABLET (40 MG TOTAL) BY MOUTH DAILY.  30 tablet  5  . lisinopril (PRINIVIL,ZESTRIL) 40 MG  tablet Take 1 tablet (40 mg total) by mouth daily.  30 tablet  5  . MAGNESIUM PO Take by mouth daily.      . Misc Natural Products (OSTEO BI-FLEX ADV JOINT SHIELD PO) Take by mouth daily.      . Multiple Vitamins-Minerals (CENTRUM SILVER ADULT 50+ PO) Take by mouth daily.      Marland Kitchen NEXIUM 40 MG capsule TAKE 1 CAPSULE BY MOUTH EVERY DAY  30 capsule  3  . acyclovir (ZOVIRAX) 400 MG tablet Take 1 tablet (400 mg total) by mouth 5 (five) times daily.  25 tablet  0  . doxycycline (DORYX) 100 MG EC tablet Take 1 tablet (100 mg total) by mouth 2 (two) times daily.  8 tablet  0   No current facility-administered medications on file prior to visit.   Fish allergy and Propulsid  History   Social History  . Marital Status: Married    Spouse Name: N/A    Number of Children: N/A  . Years of Education: N/A   Occupational History  . Not on file.   Social History Main Topics  . Smoking status: Never Smoker   . Smokeless tobacco: Never Used  . Alcohol Use: No  . Drug Use: No  . Sexually Active: Not on file   Other Topics Concern  . Not on file   Social History Narrative  .  No narrative on file   No family history on file. BP 106/60  Pulse 64  Temp(Src) 97.9 F (36.6 C)  Ht 6\' 3"  (1.905 m)  Wt 233 lb (105.688 kg)  BMI 29.12 kg/m2   Alert and oriented x 3.  Memory function appears to be intact.  Concentration and attention are normal for educational level and background.  Speech is fluent and without significant word finding difficulty.  Is aware of current events.  No carotid bruits detected.  Cranial nerve II through XII reveals normal optic discs and acuity, EOMI, PERLA, right lower face shows a subtle droop and diminished nasolabial fold compared to the left.  facial sensation intact, hearing grossly intact, gag intact,Uvula raises symmetrically and tongue protrudes evenly. Motor strength is 5 over 5 throughout all limbs.  No atrophy, abnormal tone or tremors. Reflexes are 1-2+ and  symmetric in the upper and lower extremities Sensory exam is intact. Coordination is intact for fine movements and rapid alternating movements in all limbs Gait and station are normal.   Impression: 3 brief episodes of right lip numbness due to sinusitis versus TIA.  No further episodes in the last 3+ months on aspirin now at 182 mg. He does have some baseline facial asymmetry of unknown duration and unknown cause.  Plan: Continue aspirin 182 mg per day as tolerated. Return to neurology p.r.n.

## 2013-01-16 ENCOUNTER — Other Ambulatory Visit: Payer: Self-pay | Admitting: Internal Medicine

## 2013-01-26 DIAGNOSIS — M79609 Pain in unspecified limb: Secondary | ICD-10-CM | POA: Diagnosis not present

## 2013-01-26 DIAGNOSIS — R609 Edema, unspecified: Secondary | ICD-10-CM | POA: Diagnosis not present

## 2013-02-06 ENCOUNTER — Other Ambulatory Visit: Payer: Self-pay

## 2013-04-12 ENCOUNTER — Ambulatory Visit (INDEPENDENT_AMBULATORY_CARE_PROVIDER_SITE_OTHER): Payer: Medicare Other | Admitting: Family Medicine

## 2013-04-12 ENCOUNTER — Encounter: Payer: Self-pay | Admitting: Family Medicine

## 2013-04-12 VITALS — BP 118/82 | HR 63 | Temp 98.1°F | Ht 75.0 in | Wt 236.0 lb

## 2013-04-12 DIAGNOSIS — I10 Essential (primary) hypertension: Secondary | ICD-10-CM | POA: Diagnosis not present

## 2013-04-12 DIAGNOSIS — Z23 Encounter for immunization: Secondary | ICD-10-CM | POA: Diagnosis not present

## 2013-04-12 DIAGNOSIS — E785 Hyperlipidemia, unspecified: Secondary | ICD-10-CM

## 2013-04-12 DIAGNOSIS — R351 Nocturia: Secondary | ICD-10-CM

## 2013-04-12 DIAGNOSIS — Z0000 Encounter for general adult medical examination without abnormal findings: Secondary | ICD-10-CM | POA: Diagnosis not present

## 2013-04-12 LAB — RENAL FUNCTION PANEL
BUN: 13 mg/dL (ref 6–23)
Calcium: 8.9 mg/dL (ref 8.4–10.5)
Chloride: 106 mEq/L (ref 96–112)
Creat: 1.19 mg/dL (ref 0.50–1.35)
Glucose, Bld: 95 mg/dL (ref 70–99)
Phosphorus: 2.3 mg/dL (ref 2.3–4.6)

## 2013-04-12 LAB — LIPID PANEL
LDL Cholesterol: 112 mg/dL — ABNORMAL HIGH (ref 0–99)
Triglycerides: 86 mg/dL (ref <150)
VLDL: 17 mg/dL (ref 0–40)

## 2013-04-12 LAB — CBC
HCT: 41.5 % (ref 39.0–52.0)
Hemoglobin: 14.2 g/dL (ref 13.0–17.0)
MCHC: 34.2 g/dL (ref 30.0–36.0)
MCV: 93 fL (ref 78.0–100.0)
RDW: 13.3 % (ref 11.5–15.5)

## 2013-04-12 LAB — HEPATIC FUNCTION PANEL
ALT: 16 U/L (ref 0–53)
Albumin: 3.8 g/dL (ref 3.5–5.2)
Alkaline Phosphatase: 75 U/L (ref 39–117)
Total Protein: 6.7 g/dL (ref 6.0–8.3)

## 2013-04-12 NOTE — Progress Notes (Signed)
Patient ID: TYKEL BADIE, male   DOB: [birth year], 72 y.o.   MRN: [id number] HOMMER CUNLIFFE [id number] 1941-05-24 04/12/2013      Progress Note-Follow Up  Subjective  Chief Complaint  Chief Complaint  Patient presents with  . Injections    flu- high dose  . Annual Exam    physical    HPI  Patient is a 72 year old Caucasian male in today for annual exam with wife. No recent illness. Continues to struggle with chronic knee pain but is managing. No flare in heartburn. No recent illness, headaches, chest pain, palpitations, shortness of breath, GI or GU complaints. Taking medications as prescribed. No recent falls and no depression  Past Medical History  Diagnosis Date  . Hypertension   . GERD (gastroesophageal reflux disease)   . Preventative health care 07/29/2012  . Anemia 07/29/2012  . Sinusitis, acute 09/05/2012  . Chronic knee pain 11/21/2012    Left s/p torn cartilage  . Shingles 12/12/2012    Past Surgical History  Procedure Laterality Date  . Cholecystectomy      Family History  Problem Relation Age of Onset  . COPD Mother     worked in [employer], clots  . Peripheral vascular disease Mother   . Dementia Father   . Peripheral vascular disease Brother     History   Social History  . Marital Status: Married    Spouse Name: N/A    Number of Children: N/A  . Years of Education: N/A   Occupational History  . Not on file.   Social History Main Topics  . Smoking status: Never Smoker   . Smokeless tobacco: Never Used  . Alcohol Use: No  . Drug Use: No  . Sexual Activity: No   Other Topics Concern  . Not on file   Social History Narrative  . No narrative on file    Current Outpatient Prescriptions on File Prior to Visit  Medication Sig Dispense Refill  . amLODipine (NORVASC) 5 MG tablet Take 1 tablet (5 mg total) by mouth daily.  30 tablet  6  . aspirin 81 MG tablet Take 81 mg by mouth 2 (two) times daily.      Marland Kitchen esomeprazole (NEXIUM) 40 MG capsule  Take 1 capsule (40 mg total) by mouth daily before breakfast.  30 capsule  5  . lisinopril (PRINIVIL,ZESTRIL) 40 MG tablet TAKE 1 TABLET (40 MG TOTAL) BY MOUTH DAILY.  30 tablet  5  . MAGNESIUM PO Take by mouth daily.      . Misc Natural Products (OSTEO BI-FLEX ADV JOINT SHIELD PO) Take by mouth daily.      . Multiple Vitamins-Minerals (CENTRUM SILVER ADULT 50+ PO) Take by mouth daily.      Marland Kitchen NEXIUM 40 MG capsule TAKE 1 CAPSULE BY MOUTH EVERY DAY  30 capsule  3   No current facility-administered medications on file prior to visit.    Allergies  Allergen Reactions  . Fish Allergy Itching  . Propulsid [Cisapride] Hives    Review of Systems  Review of Systems  Constitutional: Negative for fever and malaise/fatigue.  HENT: Negative for congestion.   Eyes: Negative for discharge.  Respiratory: Negative for shortness of breath.   Cardiovascular: Negative for chest pain, palpitations and leg swelling.  Gastrointestinal: Negative for nausea, abdominal pain and diarrhea.  Genitourinary: Negative for dysuria.  Musculoskeletal: Negative for falls.  Skin: Negative for rash.  Neurological: Negative for loss of consciousness and headaches.  Endo/Heme/Allergies: Negative for  polydipsia.  Psychiatric/Behavioral: Negative for depression and suicidal ideas. The patient is not nervous/anxious and does not have insomnia.     Objective  BP 118/82  Pulse 63  Temp(Src) 98.1 F (36.7 C) (Oral)  Ht 6\' 3"  (1.905 m)  Wt 236 lb 0.6 oz (107.067 kg)  BMI 29.5 kg/m2  SpO2 98%  Physical Exam  Physical Exam  Constitutional: He is oriented to person, place, and time and well-developed, well-nourished, and in no distress. No distress.  HENT:  Head: Normocephalic and atraumatic.  Eyes: Conjunctivae are normal.  Neck: Neck supple. No thyromegaly present.  Cardiovascular: Normal rate, regular rhythm and normal heart sounds.   No murmur heard. Pulmonary/Chest: Effort normal and breath sounds normal.  No respiratory distress.  Abdominal: He exhibits no distension and no mass. There is no tenderness.  Musculoskeletal: He exhibits no edema.  Neurological: He is alert and oriented to person, place, and time.  Skin: Skin is warm.  Psychiatric: Memory, affect and judgment normal.    Lab Results  Component Value Date   TSH 1.232 04/02/2012   Lab Results  Component Value Date   WBC 5.2 08/27/2012   HGB 14.6 08/27/2012   HCT 42.6 08/27/2012   MCV 93.4 08/27/2012   PLT 209 08/27/2012   Lab Results  Component Value Date   CREATININE 1.20 08/27/2012   BUN 15 08/27/2012   NA 140 08/27/2012   K 4.4 08/27/2012   CL 105 08/27/2012   CO2 26 08/27/2012   Lab Results  Component Value Date   ALT 21 08/27/2012   AST 23 08/27/2012   ALKPHOS 76 08/27/2012   BILITOT 0.7 08/27/2012     Assessment & Plan  Preventative health care Given fluvax today, will check with ins regarding Prevnar. Encouraged DASH diet, increase exercise. No trouble with ADLs, ni depression on falls. Has no Advanced Directives but is with wife today will consider  Unspecified essential hypertension Well controlled no changes  Other and unspecified hyperlipidemia Mild avoid trans fats, consider krill oil caps

## 2013-04-12 NOTE — Patient Instructions (Signed)
Salon Pas cream for feet Flaxseed oil caps daily  Call the insurance company ask about Pneumonia shot 13 valent (Prevnar)   Cholesterol Cholesterol is a white, waxy, fat-like protein needed by your body in small amounts. The liver makes all the cholesterol you need. It is carried from the liver by the blood through the blood vessels. Deposits (plaque) may build up on blood vessel walls. This makes the arteries narrower and stiffer. Plaque increases the risk for heart attack and stroke. You cannot feel your cholesterol level even if it is very high. The only way to know is by a blood test to check your lipid (fats) levels. Once you know your cholesterol levels, you should keep a record of the test results. Work with your caregiver to to keep your levels in the desired range. WHAT THE RESULTS MEAN:  Total cholesterol is a rough measure of all the cholesterol in your blood.  LDL is the so-called bad cholesterol. This is the type that deposits cholesterol in the walls of the arteries. You want this level to be low.  HDL is the good cholesterol because it cleans the arteries and carries the LDL away. You want this level to be high.  Triglycerides are fat that the body can either burn for energy or store. High levels are closely linked to heart disease. DESIRED LEVELS:  Total cholesterol below 200.  LDL below 100 for people at risk, below 70 for very high risk.  HDL above 50 is good, above 60 is best.  Triglycerides below 150. HOW TO LOWER YOUR CHOLESTEROL:  Diet.  Choose fish or white meat chicken and [place], roasted or baked. Limit fatty cuts of red meat, fried foods, and processed meats, such as sausage and lunch meat.  Eat lots of fresh fruits and vegetables. Choose whole grains, beans, pasta, potatoes and cereals.  Use only small amounts of olive, corn or canola oils. Avoid butter, mayonnaise, shortening or palm kernel oils. Avoid foods with trans-fats.  Use skim/nonfat milk and  low-fat/nonfat yogurt and cheeses. Avoid whole milk, cream, ice cream, egg yolks and cheeses. Healthy desserts include angel food cake, ginger snaps, animal crackers, hard candy, popsicles, and low-fat/nonfat frozen yogurt. Avoid pastries, cakes, pies and cookies.  Exercise.  A regular program helps decrease LDL and raises HDL.  Helps with weight control.  Do things that increase your activity level like gardening, walking, or taking the stairs.  Medication.  May be prescribed by your caregiver to help lowering cholesterol and the risk for heart disease.  You may need medicine even if your levels are normal if you have several risk factors. HOME CARE INSTRUCTIONS   Follow your diet and exercise programs as suggested by your caregiver.  Take medications as directed.  Have blood work done when your caregiver feels it is necessary. MAKE SURE YOU:   Understand these instructions.  Will watch your condition.  Will get help right away if you are not doing well or get worse. Document Released: 03/15/2001 Document Revised: 09/12/2011 Document Reviewed: 09/05/2007 [facility] Patient Information 2014 [place], [place].

## 2013-04-14 ENCOUNTER — Encounter: Payer: Self-pay | Admitting: Family Medicine

## 2013-04-14 DIAGNOSIS — E782 Mixed hyperlipidemia: Secondary | ICD-10-CM

## 2013-04-14 DIAGNOSIS — E785 Hyperlipidemia, unspecified: Secondary | ICD-10-CM

## 2013-04-14 HISTORY — DX: Mixed hyperlipidemia: E78.2

## 2013-04-14 HISTORY — DX: Hyperlipidemia, unspecified: E78.5

## 2013-04-14 NOTE — Assessment & Plan Note (Signed)
Mild avoid trans fats, consider krill oil caps

## 2013-04-14 NOTE — Assessment & Plan Note (Signed)
Given fluvax today, will check with ins regarding Prevnar. Encouraged DASH diet, increase exercise. No trouble with ADLs, ni depression on falls. Has no Advanced Directives but is with wife today will consider

## 2013-04-14 NOTE — Assessment & Plan Note (Signed)
Well controlled no changes 

## 2013-07-15 ENCOUNTER — Telehealth: Payer: Self-pay | Admitting: Family Medicine

## 2013-07-15 NOTE — Telephone Encounter (Signed)
refill-nexium dr 40mg  capsule. Take one capsule by mouth daily before breakfast. Qty 30 last fill 12.12.14

## 2013-07-16 MED ORDER — ESOMEPRAZOLE MAGNESIUM 40 MG PO CPDR: 40.0000 mg | DELAYED_RELEASE_CAPSULE | Freq: Every day | ORAL | Status: DC

## 2013-09-19 DIAGNOSIS — H524 Presbyopia: Secondary | ICD-10-CM | POA: Diagnosis not present

## 2013-09-19 DIAGNOSIS — H2589 Other age-related cataract: Secondary | ICD-10-CM | POA: Diagnosis not present

## 2013-09-19 DIAGNOSIS — H547 Unspecified visual loss: Secondary | ICD-10-CM | POA: Diagnosis not present

## 2013-09-21 DIAGNOSIS — H9209 Otalgia, unspecified ear: Secondary | ICD-10-CM | POA: Diagnosis not present

## 2013-10-11 ENCOUNTER — Other Ambulatory Visit: Payer: Self-pay | Admitting: Family Medicine

## 2013-10-11 ENCOUNTER — Ambulatory Visit: Payer: Medicare Other | Admitting: Family Medicine

## 2013-10-17 ENCOUNTER — Encounter: Payer: Self-pay | Admitting: Family Medicine

## 2013-10-17 ENCOUNTER — Ambulatory Visit (INDEPENDENT_AMBULATORY_CARE_PROVIDER_SITE_OTHER): Payer: Medicare Other | Admitting: Family Medicine

## 2013-10-17 VITALS — BP 110/68 | HR 58 | Temp 97.6°F | Ht 75.0 in | Wt 226.1 lb

## 2013-10-17 DIAGNOSIS — H6690 Otitis media, unspecified, unspecified ear: Secondary | ICD-10-CM | POA: Diagnosis not present

## 2013-10-17 DIAGNOSIS — I10 Essential (primary) hypertension: Secondary | ICD-10-CM | POA: Diagnosis not present

## 2013-10-17 DIAGNOSIS — T7840XA Allergy, unspecified, initial encounter: Secondary | ICD-10-CM

## 2013-10-17 DIAGNOSIS — E785 Hyperlipidemia, unspecified: Secondary | ICD-10-CM

## 2013-10-17 DIAGNOSIS — D649 Anemia, unspecified: Secondary | ICD-10-CM

## 2013-10-17 MED ORDER — ANTIPYRINE-BENZOCAINE 5.4-1.4 % OT SOLN: 3.0000 [drp] | OTIC | Status: DC | PRN

## 2013-10-17 MED ORDER — FLUTICASONE PROPIONATE 50 MCG/ACT NA SUSP: 2.0000 | Freq: Every day | NASAL | Status: DC

## 2013-10-17 MED ORDER — CETIRIZINE HCL 10 MG PO TABS: 10.0000 mg | ORAL_TABLET | Freq: Every day | ORAL | Status: DC | PRN

## 2013-10-17 MED ORDER — AMOXICILLIN 500 MG PO CAPS: 500.0000 mg | ORAL_CAPSULE | Freq: Three times a day (TID) | ORAL | Status: DC

## 2013-10-17 NOTE — Progress Notes (Signed)
Pre visit review using our clinic review tool, if applicable. No additional management support is needed unless otherwise documented below in the visit note. 

## 2013-10-17 NOTE — Patient Instructions (Signed)
°  Muscle Cramps and Spasms °Muscle cramps and spasms occur when a muscle or muscles tighten and you have no control over this tightening (involuntary muscle contraction). They are a common problem and can develop in any muscle. The most common place is in the calf muscles of the leg. Both muscle cramps and muscle spasms are involuntary muscle contractions, but they also have differences:  °· Muscle cramps are sporadic and painful. They may last a few seconds to a quarter of an hour. Muscle cramps are often more forceful and last longer than muscle spasms. °· Muscle spasms may or may not be painful. They may also last just a few seconds or much longer. °CAUSES  °It is uncommon for cramps or spasms to be due to a serious underlying problem. In many cases, the cause of cramps or spasms is unknown. Some common causes are:  °· Overexertion.   °· Overuse from repetitive motions (doing the same thing over and over).   °· Remaining in a certain position for a long period of time.   °· Improper preparation, form, or technique while performing a sport or activity.   °· Dehydration.   °· Injury.   °· Side effects of some medicines.   °· Abnormally low levels of the salts and ions in your blood (electrolytes), especially potassium and calcium. This could happen if you are taking water pills (diuretics) or you are pregnant.   °Some underlying medical problems can make it more likely to develop cramps or spasms. These include, but are not limited to:  °· Diabetes.   °· Parkinson disease.   °· Hormone disorders, such as thyroid problems.   °· Alcohol abuse.   °· Diseases specific to muscles, joints, and bones.   °· Blood vessel disease where not enough blood is getting to the muscles.   °HOME CARE INSTRUCTIONS  °· Stay well hydrated. Drink enough water and fluids to keep your urine clear or pale yellow. °· It may be helpful to massage, stretch, and relax the affected muscle. °· For tight or tense muscles, use a warm towel, heating  pad, or hot shower water directed to the affected area. °· If you are sore or have pain after a cramp or spasm, applying ice to the affected area may relieve discomfort. °· Put ice in a plastic bag. °· Place a towel between your skin and the bag. °· Leave the ice on for 15-20 minutes, 03-04 times a day. °· Medicines used to treat a known cause of cramps or spasms may help reduce their frequency or severity. Only take over-the-counter or prescription medicines as directed by your caregiver. °SEEK MEDICAL CARE IF:  °Your cramps or spasms get more severe, more frequent, or do not improve over time.  °MAKE SURE YOU:  °· Understand these instructions. °· Will watch your condition. °· Will get help right away if you are not doing well or get worse. °Document Released: 12/10/2001 Document Revised: 10/15/2012 Document Reviewed: 06/06/2012 °[employer]® Patient Information ©2014 [employer]. ° ° °

## 2013-10-20 ENCOUNTER — Encounter: Payer: Self-pay | Admitting: Family Medicine

## 2013-10-20 DIAGNOSIS — T7840XA Allergy, unspecified, initial encounter: Secondary | ICD-10-CM

## 2013-10-20 DIAGNOSIS — H6690 Otitis media, unspecified, unspecified ear: Secondary | ICD-10-CM | POA: Insufficient documentation

## 2013-10-20 HISTORY — DX: Allergy, unspecified, initial encounter: T78.40XA

## 2013-10-20 NOTE — Assessment & Plan Note (Signed)
Well controlled, no changes to meds. Encouraged heart healthy diet such as the DASH diet and exercise as tolerated.  °

## 2013-10-20 NOTE — Assessment & Plan Note (Signed)
Started on antibiotics and probiotics, given rx for Auralgan to manage pain,

## 2013-10-20 NOTE — Assessment & Plan Note (Signed)
Increase leafy greens, consider increased lean red meat and using cast iron cookware. Continue to monitor, report any concerns 

## 2013-10-20 NOTE — Assessment & Plan Note (Signed)
Encouraged heart healthy diet, increase exercise, avoid trans fats, consider a krill oil cap daily 

## 2013-10-20 NOTE — Progress Notes (Signed)
Patient ID: Anthony Poole, male   DOB: April 07, 1941, 73 y.o.   MRN: [id number] Anthony Poole [id number] April 07, 1941 10/20/2013      Progress Note-Follow Up  Subjective  Chief Complaint  Chief Complaint  Patient presents with  . Follow-up    6 month  . Otitis Media    right ear X 2 days    HPI  Patient is a 73 year old male in today for routine medical care. He has been struggling with her pain was actually seen in urgent care. He was given azithromycin and his pain improved returned. No fevers or chills. Only mild nasal congestion. No other acute illness or concerns. Denies CP/palp/SOB/HA/congestion/fevers/GI or GU c/o. Taking meds as prescribed  Past Medical History  Diagnosis Date  . Hypertension   . GERD (gastroesophageal reflux disease)   . Preventative health care 07/29/2012  . Anemia 07/29/2012  . Sinusitis, acute 09/05/2012  . Chronic knee pain 11/21/2012    Left s/p torn cartilage  . Shingles 12/12/2012  . Other and unspecified hyperlipidemia 04/14/2013    Past Surgical History  Procedure Laterality Date  . Cholecystectomy      Family History  Problem Relation Age of Onset  . COPD Mother     worked in [employer], clots  . Peripheral vascular disease Mother   . Dementia Father   . Peripheral vascular disease Brother     History   Social History  . Marital Status: Married    Spouse Name: N/A    Number of Children: N/A  . Years of Education: N/A   Occupational History  . Not on file.   Social History Main Topics  . Smoking status: Never Smoker   . Smokeless tobacco: Never Used  . Alcohol Use: No  . Drug Use: No  . Sexual Activity: No   Other Topics Concern  . Not on file   Social History Narrative  . No narrative on file    Current Outpatient Prescriptions on File Prior to Visit  Medication Sig Dispense Refill  . amLODipine (NORVASC) 5 MG tablet Take 1 tablet (5 mg total) by mouth daily.  30 tablet  6  . aspirin 81 MG tablet Take 81 mg by  mouth 2 (two) times daily.      Marland Kitchen. esomeprazole (NEXIUM) 40 MG capsule Take 1 capsule (40 mg total) by mouth daily before breakfast.  30 capsule  5  . lisinopril (PRINIVIL,ZESTRIL) 40 MG tablet TAKE 1 TABLET (40 MG TOTAL) BY MOUTH DAILY.  30 tablet  3  . MAGNESIUM PO Take by mouth daily.      . Misc Natural Products (OSTEO BI-FLEX ADV JOINT SHIELD PO) Take by mouth daily.      . Multiple Vitamins-Minerals (CENTRUM SILVER ADULT 50+ PO) Take by mouth daily.       No current facility-administered medications on file prior to visit.    Allergies  Allergen Reactions  . Fish Allergy Itching  . Propulsid [Cisapride] Hives    Review of Systems  Review of Systems  Constitutional: Negative for fever and malaise/fatigue.  HENT: Positive for ear pain. Negative for congestion.   Eyes: Negative for discharge.  Respiratory: Negative for shortness of breath.   Cardiovascular: Negative for chest pain, palpitations and leg swelling.  Gastrointestinal: Negative for nausea, abdominal pain and diarrhea.  Genitourinary: Negative for dysuria.  Musculoskeletal: Negative for falls.  Skin: Negative for rash.  Neurological: Negative for loss of consciousness and headaches.  Endo/Heme/Allergies: Negative for  polydipsia.  Psychiatric/Behavioral: Negative for depression and suicidal ideas. The patient is not nervous/anxious and does not have insomnia.     Objective  BP 110/68  Pulse 58  Temp(Src) 97.6 F (36.4 C) (Oral)  Ht 6\' 3"  (1.905 m)  Wt 226 lb 1.3 oz (102.549 kg)  BMI 28.26 kg/m2  SpO2 97%  Physical Exam  Physical Exam  Constitutional: He is oriented to person, place, and time and well-developed, well-nourished, and in no distress. No distress.  HENT:  Head: Normocephalic and atraumatic.  TMs dull and mildly erythematous  Eyes: Conjunctivae are normal.  Neck: Neck supple. No thyromegaly present.  Cardiovascular: Normal rate, regular rhythm and normal heart sounds.   No murmur  heard. Pulmonary/Chest: Effort normal and breath sounds normal. No respiratory distress.  Abdominal: He exhibits no distension and no mass. There is no tenderness.  Musculoskeletal: He exhibits no edema.  Neurological: He is alert and oriented to person, place, and time.  Skin: Skin is warm.  Psychiatric: Memory, affect and judgment normal.    Lab Results  Component Value Date   TSH 1.344 04/12/2013   Lab Results  Component Value Date   WBC 6.0 04/12/2013   HGB 14.2 04/12/2013   HCT 41.5 04/12/2013   MCV 93.0 04/12/2013   PLT 270 04/12/2013   Lab Results  Component Value Date   CREATININE 1.19 04/12/2013   BUN 13 04/12/2013   NA 138 04/12/2013   K 4.5 04/12/2013   CL 106 04/12/2013   CO2 26 04/12/2013   Lab Results  Component Value Date   ALT 16 04/12/2013   AST 18 04/12/2013   ALKPHOS 75 04/12/2013   BILITOT 0.7 04/12/2013   Lab Results  Component Value Date   CHOL 177 04/12/2013   Lab Results  Component Value Date   HDL 48 04/12/2013   Lab Results  Component Value Date   LDLCALC 112* 04/12/2013   Lab Results  Component Value Date   TRIG 86 04/12/2013   Lab Results  Component Value Date   CHOLHDL 3.7 04/12/2013     Assessment & Plan  Unspecified essential hypertension Well controlled, no changes to meds. Encouraged heart healthy diet such as the DASH diet and exercise as tolerated.   Other and unspecified hyperlipidemia Encouraged heart healthy diet, increase exercise, avoid trans fats, consider a krill oil cap daily  Anemia Increase leafy greens, consider increased lean red meat and using cast iron cookware. Continue to monitor, report any concerns  Otitis media Started on antibiotics and probiotics, given rx for Auralgan to manage pain,

## 2013-11-18 ENCOUNTER — Other Ambulatory Visit: Payer: Self-pay | Admitting: Family Medicine

## 2013-12-29 ENCOUNTER — Other Ambulatory Visit: Payer: Self-pay | Admitting: Family Medicine

## 2014-02-07 ENCOUNTER — Other Ambulatory Visit: Payer: Self-pay | Admitting: Family Medicine

## 2014-03-11 ENCOUNTER — Other Ambulatory Visit: Payer: Self-pay | Admitting: Family Medicine

## 2014-04-11 IMAGING — CT CT HEAD W/O CM
1 series · 16 of 30 positions shown, 20 images · non-contrast
Comparison: None.

CLINICAL DATA: Dizziness, weakness.

CT HEAD WITHOUT CONTRAST
TECHNIQUE: Contiguous axial images were obtained from the base of
the skull through the vertex without contrast.

[Series 2: head 4.8 h37s · axial · 0.46mm/px · z∈[-134,+22]mm · 16 of 36 slices shown, 20 images]
[im 2/36  brain]
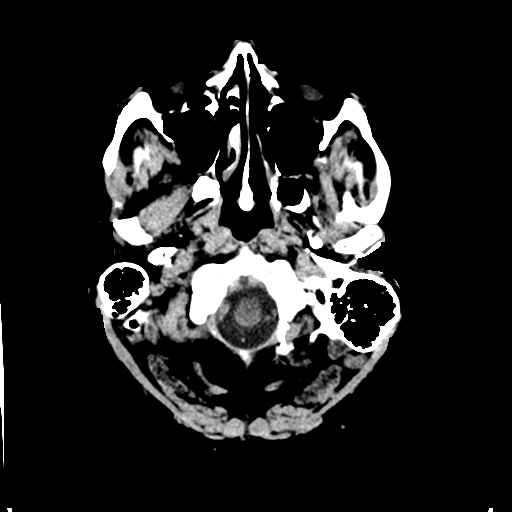
[im 2/36  bone]
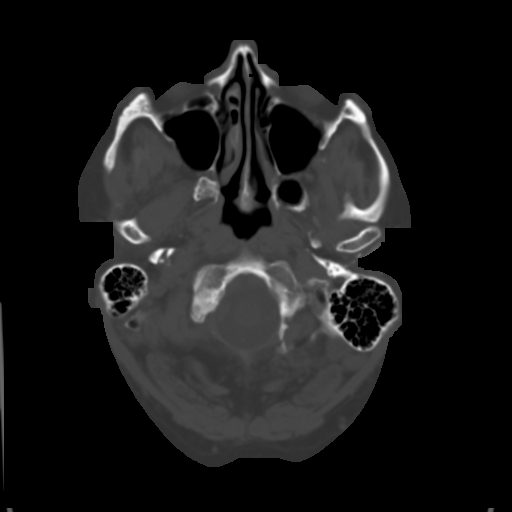
[im 4/36  brain]
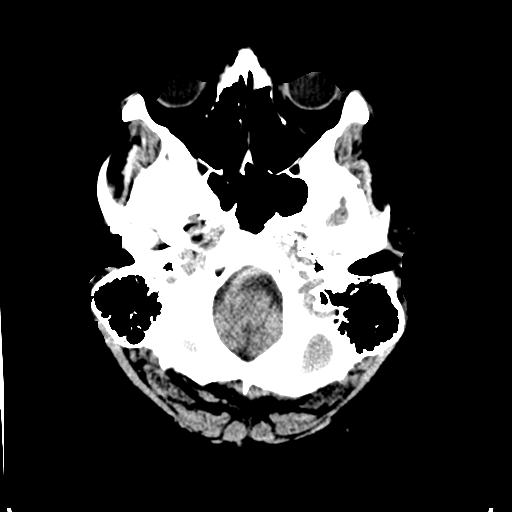
[im 7/36  brain]
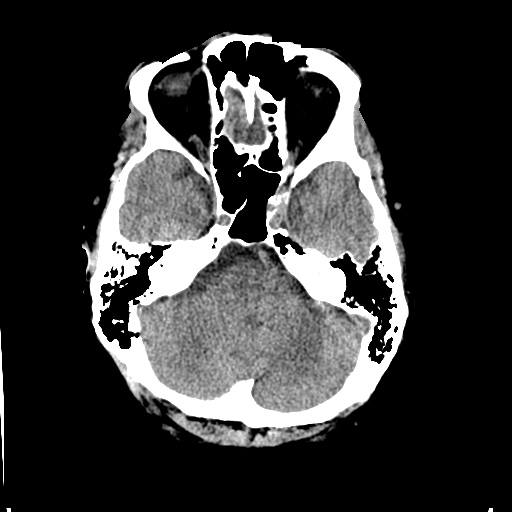
[im 9/36  brain]
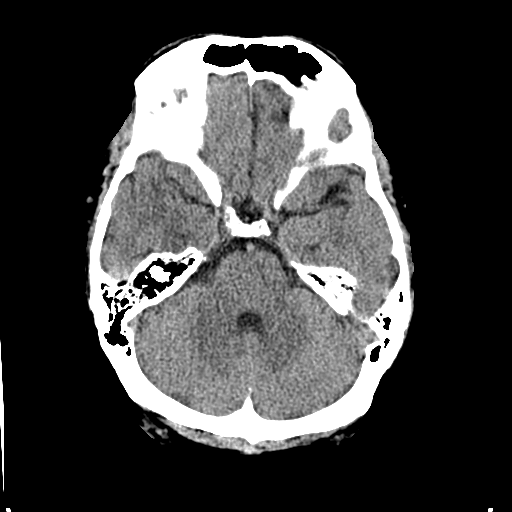
[im 10/36  brain]
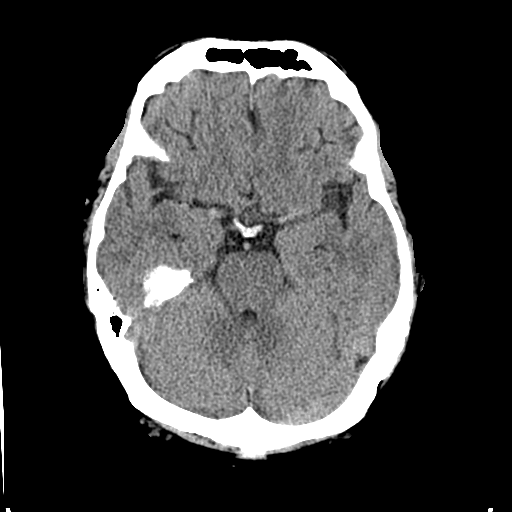
[im 10/36  bone]
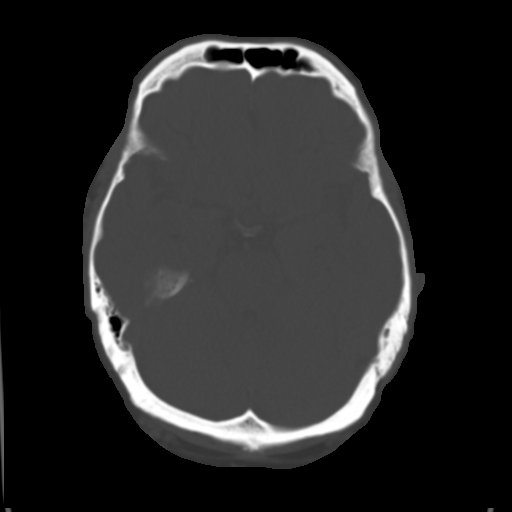
[im 13/36  brain]
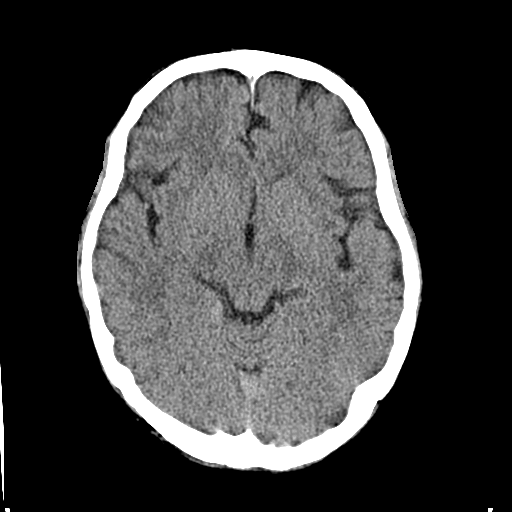
[im 15/36  brain]
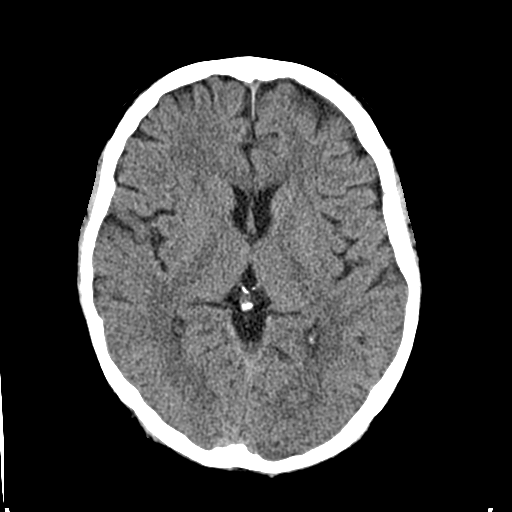
[im 17/36  brain]
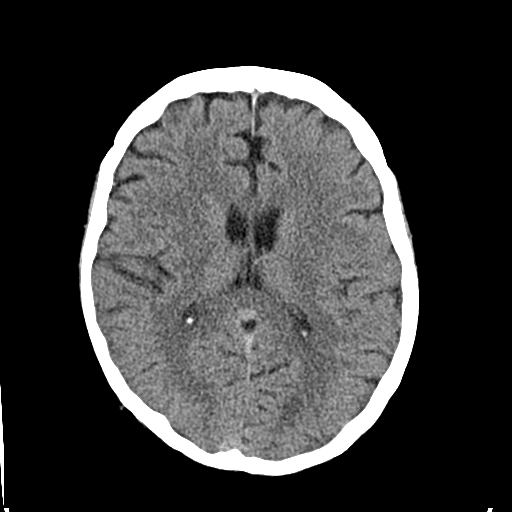
[im 19/36  brain]
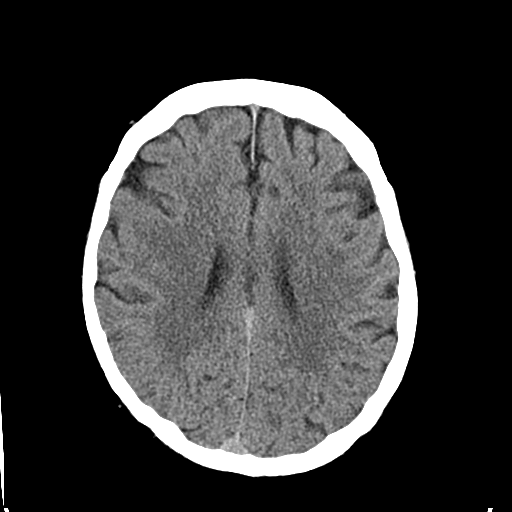
[im 19/36  bone]
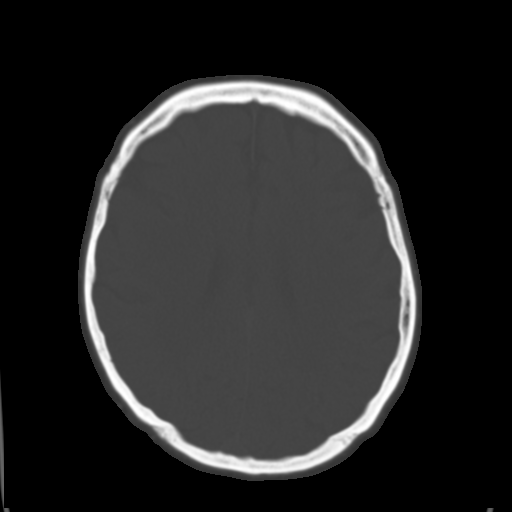
[im 21/36  brain]
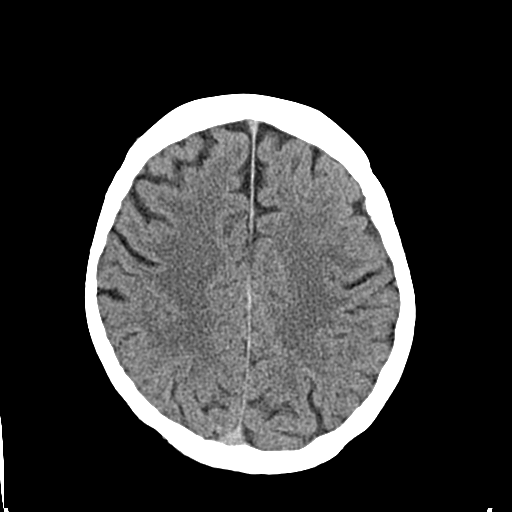
[im 23/36  brain]
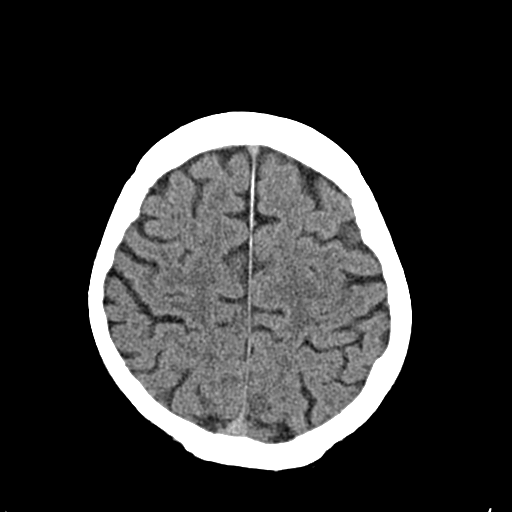
[im 26/36  brain]
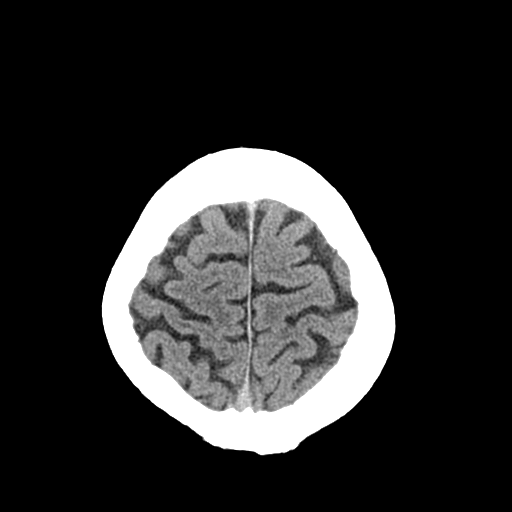
[im 27/36  brain]
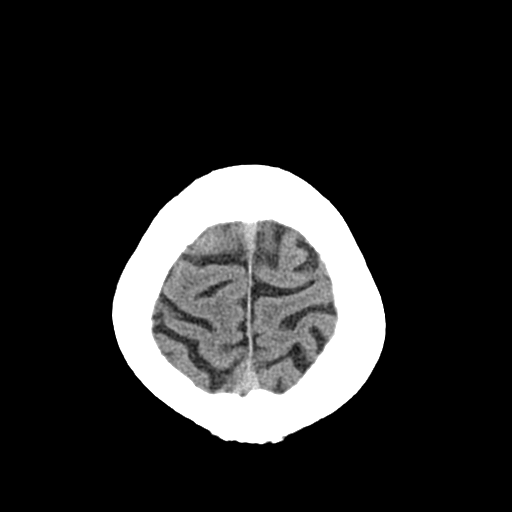
[im 27/36  bone]
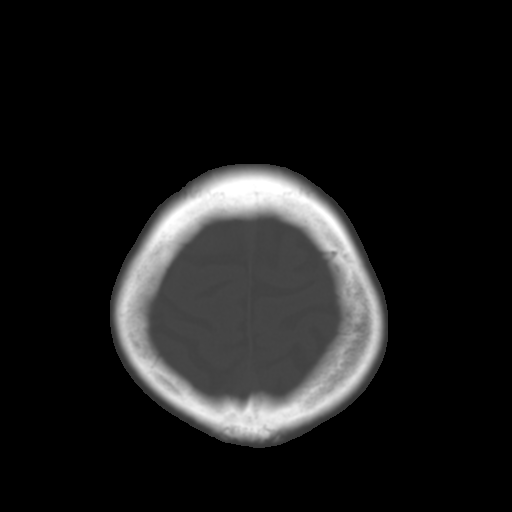
[im 29/36  brain]
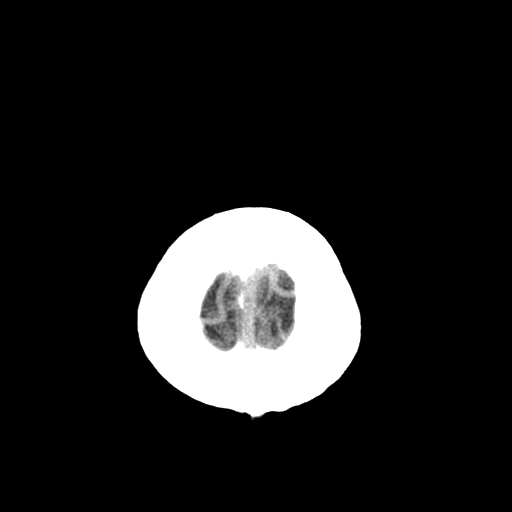
[im 32/36  brain]
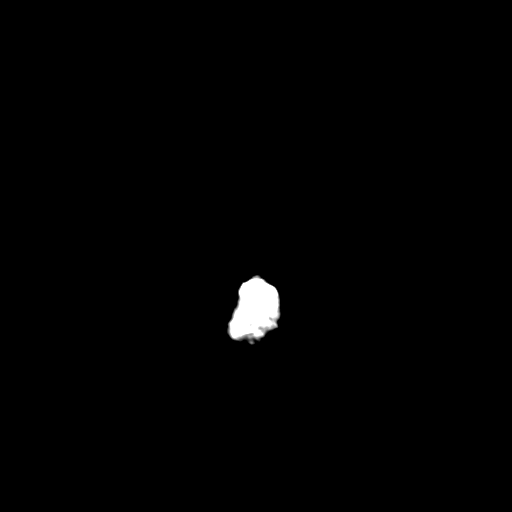
[im 34/36  brain]
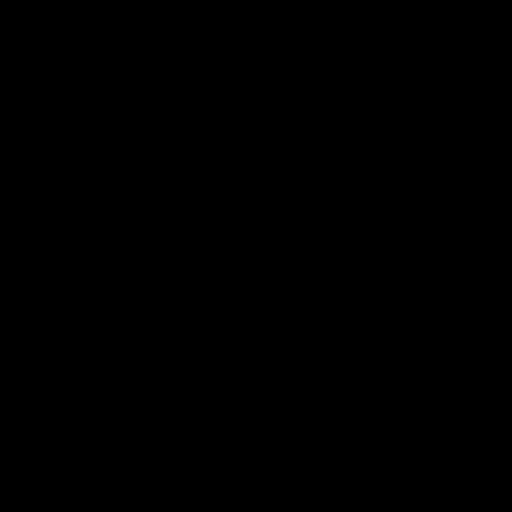

[16 of 30 positions shown; findings below may reference images not displayed]

FINDINGS: No acute intracranial abnormality.  Specifically, no
hemorrhage, hydrocephalus, mass lesion, acute infarction, or
significant intracranial injury.  No acute calvarial abnormality.
Visualized paranasal sinuses and mastoids clear.  Orbital soft
tissues unremarkable.
IMPRESSION: No acute intracranial abnormality.

## 2014-04-14 ENCOUNTER — Encounter: Payer: Self-pay | Admitting: Family Medicine

## 2014-04-14 ENCOUNTER — Ambulatory Visit (INDEPENDENT_AMBULATORY_CARE_PROVIDER_SITE_OTHER): Payer: Medicare Other | Admitting: Family Medicine

## 2014-04-14 VITALS — BP 124/64 | HR 48 | Temp 97.7°F | Ht 75.0 in | Wt 227.2 lb

## 2014-04-14 DIAGNOSIS — H547 Unspecified visual loss: Secondary | ICD-10-CM

## 2014-04-14 DIAGNOSIS — R51 Headache: Secondary | ICD-10-CM | POA: Diagnosis not present

## 2014-04-14 DIAGNOSIS — Z0000 Encounter for general adult medical examination without abnormal findings: Secondary | ICD-10-CM | POA: Insufficient documentation

## 2014-04-14 DIAGNOSIS — Z23 Encounter for immunization: Secondary | ICD-10-CM | POA: Diagnosis not present

## 2014-04-14 DIAGNOSIS — I10 Essential (primary) hypertension: Secondary | ICD-10-CM

## 2014-04-14 DIAGNOSIS — R519 Headache, unspecified: Secondary | ICD-10-CM

## 2014-04-14 DIAGNOSIS — Z125 Encounter for screening for malignant neoplasm of prostate: Secondary | ICD-10-CM

## 2014-04-14 HISTORY — DX: Encounter for general adult medical examination without abnormal findings: Z00.00

## 2014-04-14 LAB — LIPID PANEL
Cholesterol: 197 mg/dL (ref 0–200)
HDL: 44.5 mg/dL (ref >39.00)
LDL Cholesterol: 139 mg/dL — ABNORMAL HIGH (ref 0–99)
NonHDL: 152.5
Total CHOL/HDL Ratio: 4
Triglycerides: 67 mg/dL (ref 0.0–149.0)
VLDL: 13.4 mg/dL (ref 0.0–40.0)

## 2014-04-14 LAB — SEDIMENTATION RATE: Sed Rate: 8 mm/h (ref 0–22)

## 2014-04-14 LAB — PSA, MEDICARE: PSA: 0.47 ng/mL (ref 0.10–4.00)

## 2014-04-14 LAB — RENAL FUNCTION PANEL
ALBUMIN: 3.5 g/dL (ref 3.5–5.2)
BUN: 22 mg/dL (ref 6–23)
CHLORIDE: 106 meq/L (ref 96–112)
CO2: 26 meq/L (ref 19–32)
Calcium: 9.1 mg/dL (ref 8.4–10.5)
Creatinine, Ser: 1.3 mg/dL (ref 0.4–1.5)
GFR: 56.62 mL/min — ABNORMAL LOW (ref >60.00)
Glucose, Bld: 91 mg/dL (ref 70–99)
Phosphorus: 3 mg/dL (ref 2.3–4.6)
Potassium: 5.1 mEq/L (ref 3.5–5.1)
Sodium: 139 mEq/L (ref 135–145)

## 2014-04-14 LAB — CBC
HCT: 42.7 % (ref 39.0–52.0)
Hemoglobin: 13.9 g/dL (ref 13.0–17.0)
MCHC: 32.5 g/dL (ref 30.0–36.0)
MCV: 96.1 fl (ref 78.0–100.0)
Platelets: 248 K/uL (ref 150.0–400.0)
RBC: 4.44 Mil/uL (ref 4.22–5.81)
RDW: 13.7 % (ref 11.5–15.5)
WBC: 5.2 K/uL (ref 4.0–10.5)

## 2014-04-14 LAB — HEPATIC FUNCTION PANEL
ALT: 20 U/L (ref 0–53)
AST: 23 U/L (ref 0–37)
Albumin: 3.5 g/dL (ref 3.5–5.2)
Alkaline Phosphatase: 76 U/L (ref 39–117)
Bilirubin, Direct: 0.2 mg/dL (ref 0.0–0.3)
Total Bilirubin: 1 mg/dL (ref 0.2–1.2)
Total Protein: 7.3 g/dL (ref 6.0–8.3)

## 2014-04-14 NOTE — Patient Instructions (Addendum)
Clean any lesions with Witch Hazel Astringent or Hydrogen Peroxide, warm compresses and antibiotic ointment.  Hypertension Hypertension, commonly called high blood pressure, is when the force of blood pumping through your arteries is too strong. Your arteries are the blood vessels that carry blood from your heart throughout your body. A blood pressure reading consists of a higher number over a lower number, such as 110/72. The higher number (systolic) is the pressure inside your arteries when your heart pumps. The lower number (diastolic) is the pressure inside your arteries when your heart relaxes. Ideally you want your blood pressure below 120/80. Hypertension forces your heart to work harder to pump blood. Your arteries may become narrow or stiff. Having hypertension puts you at risk for heart disease, stroke, and other problems.  RISK FACTORS Some risk factors for high blood pressure are controllable. Others are not.  Risk factors you cannot control include:   Race. You may be at higher risk if you are African American.  Age. Risk increases with age.  Gender. Men are at higher risk than women before age 72 years. After age 72, women are at higher risk than men. Risk factors you can control include:  Not getting enough exercise or physical activity.  Being overweight.  Getting too much fat, sugar, calories, or salt in your diet.  Drinking too much alcohol. SIGNS AND SYMPTOMS Hypertension does not usually cause signs or symptoms. Extremely high blood pressure (hypertensive crisis) may cause headache, anxiety, shortness of breath, and nosebleed. DIAGNOSIS  To check if you have hypertension, your health care provider will measure your blood pressure while you are seated, with your arm held at the level of your heart. It should be measured at least twice using the same arm. Certain conditions can cause a difference in blood pressure between your right and left arms. A blood pressure reading  that is higher than normal on one occasion does not mean that you need treatment. If one blood pressure reading is high, ask your health care provider about having it checked again. TREATMENT  Treating high blood pressure includes making lifestyle changes and possibly taking medicine. Living a healthy lifestyle can help lower high blood pressure. You may need to change some of your habits. Lifestyle changes may include:  Following the DASH diet. This diet is high in fruits, vegetables, and whole grains. It is low in salt, red meat, and added sugars.  Getting at least 2 hours of brisk physical activity every week.  Losing weight if necessary.  Not smoking.  Limiting alcoholic beverages.  Learning ways to reduce stress. If lifestyle changes are not enough to get your blood pressure under control, your health care provider may prescribe medicine. You may need to take more than one. Work closely with your health care provider to understand the risks and benefits. HOME CARE INSTRUCTIONS  Have your blood pressure rechecked as directed by your health care provider.   Take medicines only as directed by your health care provider. Follow the directions carefully. Blood pressure medicines must be taken as prescribed. The medicine does not work as well when you skip doses. Skipping doses also puts you at risk for problems.   Do not smoke.   Monitor your blood pressure at home as directed by your health care provider. SEEK MEDICAL CARE IF:   You think you are having a reaction to medicines taken.  You have recurrent headaches or feel dizzy.  You have swelling in your ankles.  You have trouble  with your vision. SEEK IMMEDIATE MEDICAL CARE IF:  You develop a severe headache or confusion.  You have unusual weakness, numbness, or feel faint.  You have severe chest or abdominal pain.  You vomit repeatedly.  You have trouble breathing. MAKE SURE YOU:   Understand these  instructions.  Will watch your condition.  Will get help right away if you are not doing well or get worse. Document Released: 06/20/2005 Document Revised: 11/04/2013 Document Reviewed: 04/12/2013 [facility] Patient Information 2015 [place], [place]. This information is not intended to replace advice given to you by your health care provider. Make sure you discuss any questions you have with your health care provider.

## 2014-04-14 NOTE — Assessment & Plan Note (Addendum)
Patient denies any difficulties at home. No trouble with ADLs, depression or falls. No recent changes to vision or hearing. Is UTD with immunizations. Is UTD with screening. Discussed Advanced Directives, patient agrees to bring us copies of documents if can. Encouraged heart healthy diet, exercise as tolerated and adequate sleep. Given Prevnar and flu shots today. Sees Dr Craige [facility] has next appt on 05/10/14. Declines colonoscopy encouraged to reconsider an notify us of any bowel changes

## 2014-04-14 NOTE — Progress Notes (Signed)
Pre visit review using our clinic review tool, if applicable. No additional management support is needed unless otherwise documented below in the visit note. 

## 2014-04-14 NOTE — Assessment & Plan Note (Signed)
Well controlled, no changes to meds. Encouraged heart healthy diet such as the DASH diet and exercise as tolerated.  °

## 2014-04-20 ENCOUNTER — Encounter: Payer: Self-pay | Admitting: Family Medicine

## 2014-04-20 DIAGNOSIS — R519 Headache, unspecified: Secondary | ICD-10-CM

## 2014-04-20 DIAGNOSIS — R51 Headache: Secondary | ICD-10-CM

## 2014-04-20 HISTORY — DX: Headache, unspecified: R51.9

## 2014-04-20 NOTE — Assessment & Plan Note (Signed)
Likely related to dental concerns. No fevers and improved at present, encouraged to proceed with dental care

## 2014-04-20 NOTE — Progress Notes (Signed)
Patient ID: Anthony Poole, male   DOB: 1940/08/27, 73 y.o.   MRN: [id number] Anthony Poole [id number] 1940/08/27 04/20/2014      Progress Note-Follow Up  Subjective  Chief Complaint  Chief Complaint  Patient presents with  . Annual Exam    medicare wellness  . Injections    flu and prevnar    HPI  Patient is a 73 year old male in today for routine medical care. He is in today for annual exam. Overall is doing well. Has an appointment soon with dermatology been no new concerns or lesions. He has been struggling with some mild tilt he thinks is related to some trouble with canal several months ago. No fevers or chills aching. Has an occasional cough but it is dry and does not awaken him. No recent illness or acute concerns. Did have a lesion near his spine in his mid back about 2 months ago which was enlarged and mildly tender with palp but it has largely receded. Denies CP/palp/SOB/HA/congestion/fevers/GI or GU c/o. Taking meds as prescribed  Past Medical History  Diagnosis Date  . Hypertension   . GERD (gastroesophageal reflux disease)   . Preventative health care 07/29/2012  . Anemia 07/29/2012  . Sinusitis, acute 09/05/2012  . Chronic knee pain 11/21/2012    Left s/p torn cartilage  . Shingles 12/12/2012  . Other and unspecified hyperlipidemia 04/14/2013  . Medicare annual wellness visit, subsequent 04/14/2014  . Headache 04/20/2014    Past Surgical History  Procedure Laterality Date  . Cholecystectomy      Family History  Problem Relation Age of Onset  . COPD Mother     worked in [employer], clots  . Peripheral vascular disease Mother   . Dementia Father   . Peripheral vascular disease Brother     History   Social History  . Marital Status: Married    Spouse Name: N/A    Number of Children: N/A  . Years of Education: N/A   Occupational History  . Not on file.   Social History Main Topics  . Smoking status: Never Smoker   . Smokeless tobacco: Never Used   . Alcohol Use: No  . Drug Use: No  . Sexual Activity: No     Comment: lives with wife, no dietary restrictions,  no fish   Other Topics Concern  . Not on file   Social History Narrative  . No narrative on file    Current Outpatient Prescriptions on File Prior to Visit  Medication Sig Dispense Refill  . amLODipine (NORVASC) 5 MG tablet TAKE 1 TABLET BY MOUTH EVERY DAY  30 tablet  6  . aspirin 81 MG tablet Take 81 mg by mouth 2 (two) times daily.      Marland Kitchen. lisinopril (PRINIVIL,ZESTRIL) 40 MG tablet TAKE 1 TABLET (40 MG TOTAL) BY MOUTH DAILY.  30 tablet  3  . MAGNESIUM PO Take by mouth daily.      . Misc Natural Products (OSTEO BI-FLEX ADV JOINT SHIELD PO) Take by mouth daily.      . Multiple Vitamins-Minerals (CENTRUM SILVER ADULT 50+ PO) Take by mouth daily.      Marland Kitchen. NEXIUM 40 MG capsule TAKE ONE CAPSULE BY MOUTH EVERY DAY AT 12 NOON  30 capsule  3   No current facility-administered medications on file prior to visit.    Allergies  Allergen Reactions  . Fish Allergy Itching  . Propulsid [Cisapride] Hives    Review of Systems  Review  of Systems  Constitutional: Negative for fever and malaise/fatigue.  HENT: Negative for congestion.   Eyes: Negative for discharge.  Respiratory: Negative for shortness of breath.   Cardiovascular: Negative for chest pain, palpitations and leg swelling.  Gastrointestinal: Negative for nausea, abdominal pain and diarrhea.  Genitourinary: Negative for dysuria.  Musculoskeletal: Negative for falls.  Skin: Negative for rash.  Neurological: Positive for headaches. Negative for loss of consciousness.  Endo/Heme/Allergies: Negative for polydipsia.  Psychiatric/Behavioral: Negative for depression and suicidal ideas. The patient is not nervous/anxious and does not have insomnia.     Objective  BP 124/64  Pulse 48  Temp(Src) 97.7 F (36.5 C) (Oral)  Ht 6\' 3"  (1.905 m)  Wt 227 lb 3.2 oz (103.057 kg)  BMI 28.40 kg/m2  SpO2 100%  Physical  Exam  Physical Exam  Constitutional: He is oriented to person, place, and time and well-developed, well-nourished, and in no distress. No distress.  HENT:  Head: Normocephalic and atraumatic.  Eyes: Conjunctivae are normal.  Neck: Neck supple. No thyromegaly present.  Cardiovascular: Normal rate, regular rhythm and normal heart sounds.   No murmur heard. Pulmonary/Chest: Effort normal and breath sounds normal. No respiratory distress.  Abdominal: He exhibits no distension and no mass. There is no tenderness.  Musculoskeletal: He exhibits no edema.  Neurological: He is alert and oriented to person, place, and time.  Skin: Skin is warm.  Psychiatric: Memory, affect and judgment normal.    Lab Results  Component Value Date   TSH 1.344 04/12/2013   Lab Results  Component Value Date   WBC 5.2 04/14/2014   HGB 13.9 04/14/2014   HCT 42.7 04/14/2014   MCV 96.1 04/14/2014   PLT 248.0 04/14/2014   Lab Results  Component Value Date   CREATININE 1.3 04/14/2014   BUN 22 04/14/2014   NA 139 04/14/2014   K 5.1 04/14/2014   CL 106 04/14/2014   CO2 26 04/14/2014   Lab Results  Component Value Date   ALT 20 04/14/2014   AST 23 04/14/2014   ALKPHOS 76 04/14/2014   BILITOT 1.0 04/14/2014   Lab Results  Component Value Date   CHOL 197 04/14/2014   Lab Results  Component Value Date   HDL 44.50 04/14/2014   Lab Results  Component Value Date   LDLCALC 139* 04/14/2014   Lab Results  Component Value Date   TRIG 67.0 04/14/2014   Lab Results  Component Value Date   CHOLHDL 4 04/14/2014     Assessment & Plan  Medicare annual wellness visit, subsequent Patient denies any difficulties at home. No trouble with ADLs, depression or falls. No recent changes to vision or hearing. Is UTD with immunizations. Is UTD with screening. Discussed Advanced Directives, patient agrees to bring us copies of documents if can. Encouraged heart healthy diet, exercise as tolerated and adequate  sleep. Given Prevnar and flu shots today. Sees Dr Craige [facility] has next appt on 05/10/14. Declines colonoscopy encouraged to reconsider an notify us of any bowel changes  Essential hypertension Well controlled, no changes to meds. Encouraged heart healthy diet such as the DASH diet and exercise as tolerated.   Headache Likely related to dental concerns. No fevers and improved at present, encouraged to proceed with dental care

## 2014-05-05 DIAGNOSIS — Z85828 Personal history of other malignant neoplasm of skin: Secondary | ICD-10-CM | POA: Diagnosis not present

## 2014-05-05 DIAGNOSIS — Z08 Encounter for follow-up examination after completed treatment for malignant neoplasm: Secondary | ICD-10-CM | POA: Diagnosis not present

## 2014-05-05 DIAGNOSIS — L570 Actinic keratosis: Secondary | ICD-10-CM | POA: Diagnosis not present

## 2014-06-03 ENCOUNTER — Other Ambulatory Visit: Payer: Self-pay | Admitting: Family Medicine

## 2014-06-22 ENCOUNTER — Other Ambulatory Visit: Payer: Self-pay | Admitting: Family Medicine

## 2014-06-23 NOTE — Telephone Encounter (Signed)
Med filled.  

## 2014-06-25 ENCOUNTER — Other Ambulatory Visit: Payer: Self-pay | Admitting: Family Medicine

## 2014-07-08 DIAGNOSIS — J029 Acute pharyngitis, unspecified: Secondary | ICD-10-CM | POA: Diagnosis not present

## 2014-07-27 ENCOUNTER — Other Ambulatory Visit: Payer: Self-pay | Admitting: Family Medicine

## 2014-09-22 ENCOUNTER — Other Ambulatory Visit: Payer: Self-pay | Admitting: Family Medicine

## 2014-09-22 NOTE — Telephone Encounter (Signed)
Med filled.  

## 2014-10-14 ENCOUNTER — Encounter: Payer: Self-pay | Admitting: Family Medicine

## 2014-10-14 ENCOUNTER — Ambulatory Visit (INDEPENDENT_AMBULATORY_CARE_PROVIDER_SITE_OTHER): Payer: Medicare Other | Admitting: Family Medicine

## 2014-10-14 VITALS — BP 116/72 | HR 65 | Temp 98.2°F | Resp 18 | Ht 75.0 in | Wt 225.0 lb

## 2014-10-14 DIAGNOSIS — E782 Mixed hyperlipidemia: Secondary | ICD-10-CM | POA: Diagnosis not present

## 2014-10-14 DIAGNOSIS — I10 Essential (primary) hypertension: Secondary | ICD-10-CM | POA: Diagnosis not present

## 2014-10-14 DIAGNOSIS — K219 Gastro-esophageal reflux disease without esophagitis: Secondary | ICD-10-CM | POA: Diagnosis not present

## 2014-10-14 DIAGNOSIS — E669 Obesity, unspecified: Secondary | ICD-10-CM

## 2014-10-14 DIAGNOSIS — R000 Tachycardia, unspecified: Secondary | ICD-10-CM | POA: Insufficient documentation

## 2014-10-14 HISTORY — DX: Tachycardia, unspecified: R00.0

## 2014-10-14 NOTE — Patient Instructions (Signed)
Consider probiotic such as [employer]age or [employer] [employer]  Food Choices for Gastroesophageal Reflux Disease When you have gastroesophageal reflux disease (GERD), the foods you eat and your eating habits are very important. Choosing the right foods can help ease your discomfort.  WHAT GUIDELINES DO I NEED TO FOLLOW?   Choose fruits, vegetables, whole grains, and low-fat dairy products.   Choose low-fat meat, fish, and poultry.  Limit fats such as oils, salad dressings, butter, nuts, and avocado.   Keep a food diary. This helps you identify foods that cause symptoms.   Avoid foods that cause symptoms. These may be different for everyone.   Eat small meals often instead of 3 large meals a day.   Eat your meals slowly, in a place where you are relaxed.   Limit fried foods.   Cook foods using methods other than frying.   Avoid drinking alcohol.   Avoid drinking large amounts of liquids with your meals.   Avoid bending over or lying down until 2-3 hours after eating.  WHAT FOODS ARE NOT RECOMMENDED?  These are some foods and drinks that may make your symptoms worse: Vegetables Tomatoes. Tomato juice. Tomato and spaghetti sauce. Chili peppers. Onion and garlic. Horseradish. Fruits Oranges, grapefruit, and lemon (fruit and juice). Meats High-fat meats, fish, and poultry. This includes hot dogs, ribs, ham, sausage, salami, and bacon. Dairy Whole milk and chocolate milk. Sour cream. Cream. Butter. Ice cream. Cream cheese.  Drinks Coffee and tea. Bubbly (carbonated) drinks or energy drinks. Condiments Hot sauce. Barbecue sauce.  Sweets/Desserts Chocolate and cocoa. Donuts. Peppermint and spearmint. Fats and Oils High-fat foods. This includes [place] fries and potato chips. Other Vinegar. Strong spices. This includes black pepper, white pepper, red pepper, cayenne, curry powder, cloves, ginger, and chili powder. The items listed above may not be a complete  list of foods and drinks to avoid. Contact your dietitian for more information. Document Released: 12/20/2011 Document Revised: 06/25/2013 Document Reviewed: 04/24/2013 [facility] Patient Information 2015 [place], [place]. This information is not intended to replace advice given to you by your health care provider. Make sure you discuss any questions you have with your health care provider.

## 2014-10-14 NOTE — Assessment & Plan Note (Signed)
Well controlled, no changes to meds. Encouraged heart healthy diet such as the DASH diet and exercise as tolerated.  °

## 2014-10-14 NOTE — Progress Notes (Signed)
Pre visit review using our clinic review tool, if applicable. No additional management support is needed unless otherwise documented below in the visit note. 

## 2014-10-14 NOTE — Progress Notes (Signed)
Anthony GarnetGary L Khouri  [id number] 1940/10/27 10/14/2014      Progress Note-Follow Up  Subjective  Chief Complaint  Chief Complaint  Patient presents with  . Follow-up    6 mo    HPI  Patient is a 74 y.o. male in today for routine medical care. Patient is in today for follow-up. Feeling well. No recent illness. Has had some trouble with heartburn intermittently but a second Nexium controls it. He does not have to do this frequently. Denies CP/palp/SOB/HA/congestion/fevers or GU c/o. Taking meds as prescribed  Past Medical History  Diagnosis Date  . Hypertension   . GERD (gastroesophageal reflux disease)   . Preventative health care 07/29/2012  . Anemia 07/29/2012  . Sinusitis, acute 09/05/2012  . Chronic knee pain 11/21/2012    Left s/p torn cartilage  . Shingles 12/12/2012  . Other and unspecified hyperlipidemia 04/14/2013  . Medicare annual wellness visit, subsequent 04/14/2014  . Headache 04/20/2014    Past Surgical History  Procedure Laterality Date  . Cholecystectomy      Family History  Problem Relation Age of Onset  . COPD Mother     worked in [employer], clots  . Peripheral vascular disease Mother   . Dementia Father   . Peripheral vascular disease Brother     History   Social History  . Marital Status: Married    Spouse Name: N/A  . Number of Children: N/A  . Years of Education: N/A   Occupational History  . Not on file.   Social History Main Topics  . Smoking status: Never Smoker   . Smokeless tobacco: Never Used  . Alcohol Use: No  . Drug Use: No  . Sexual Activity: No     Comment: lives with wife, no dietary restrictions,  no fish   Other Topics Concern  . Not on file   Social History Narrative    Current Outpatient Prescriptions on File Prior to Visit  Medication Sig Dispense Refill  . amLODipine (NORVASC) 5 MG tablet TAKE 1 TABLET BY MOUTH EVERY DAY 30 tablet 5  . aspirin 81 MG tablet Take 81 mg by mouth 2 (two) times daily.    Marland Kitchen.  lisinopril (PRINIVIL,ZESTRIL) 40 MG tablet TAKE 1 TABLET BY MOUTH EVERY DAY 30 tablet 3  . MAGNESIUM PO Take by mouth daily.    . Misc Natural Products (OSTEO BI-FLEX ADV JOINT SHIELD PO) Take by mouth daily.    . Multiple Vitamins-Minerals (CENTRUM SILVER ADULT 50+ PO) Take by mouth daily.    Marland Kitchen. NEXIUM 40 MG capsule TAKE ONE CAPSULE BY MOUTH EVERY DAY AT 12 NOON 30 capsule 3   No current facility-administered medications on file prior to visit.    Allergies  Allergen Reactions  . Fish Allergy Itching  . Propulsid [Cisapride] Hives    Review of Systems  Review of Systems  Constitutional: Negative for fever and malaise/fatigue.  HENT: Negative for congestion.   Eyes: Negative for discharge.  Respiratory: Negative for cough and shortness of breath.   Cardiovascular: Negative for chest pain, palpitations and leg swelling.  Gastrointestinal: Negative for nausea, abdominal pain and diarrhea.  Genitourinary: Negative for dysuria.  Musculoskeletal: Negative for falls.  Skin: Negative for rash.  Neurological: Negative for loss of consciousness and headaches.  Endo/Heme/Allergies: Negative for polydipsia.  Psychiatric/Behavioral: Negative for depression and suicidal ideas. The patient is not nervous/anxious and does not have insomnia.     Objective  BP 116/72 mmHg  Pulse 65  Temp(Src) 98.2  F (36.8 C) (Oral)  Resp 18  Ht  (1.905 m)  Wt 225 lb (102.059 kg)  BMI 28.12 kg/m2  SpO2 98%  Physical Exam  Physical Exam  Constitutional: He is oriented to person, place, and time and well-developed, well-nourished, and in no distress. No distress.  HENT:  Head: Normocephalic and atraumatic.  Eyes: Conjunctivae are normal.  Neck: Neck supple. No thyromegaly present.  Cardiovascular: Normal rate, regular rhythm and normal heart sounds.   No murmur heard. Pulmonary/Chest: Effort normal and breath sounds normal. No respiratory distress.  Abdominal: Soft. Bowel sounds are normal. He  exhibits no distension and no mass. There is no tenderness.  Musculoskeletal: He exhibits no edema.  Neurological: He is alert and oriented to person, place, and time.  Skin: Skin is warm.  Psychiatric: Memory, affect and judgment normal.    Lab Results  Component Value Date   TSH 1.344 04/12/2013   Lab Results  Component Value Date   WBC 5.2 04/14/2014   HGB 13.9 04/14/2014   HCT 42.7 04/14/2014   MCV 96.1 04/14/2014   PLT 248.0 04/14/2014   Lab Results  Component Value Date   CREATININE 1.3 04/14/2014   BUN 22 04/14/2014   NA 139 04/14/2014   K 5.1 04/14/2014   CL 106 04/14/2014   CO2 26 04/14/2014   Lab Results  Component Value Date   ALT 20 04/14/2014   AST 23 04/14/2014   ALKPHOS 76 04/14/2014   BILITOT 1.0 04/14/2014   Lab Results  Component Value Date   CHOL 197 04/14/2014   Lab Results  Component Value Date   HDL 44.50 04/14/2014   Lab Results  Component Value Date   LDLCALC 139* 04/14/2014   Lab Results  Component Value Date   TRIG 67.0 04/14/2014   Lab Results  Component Value Date   CHOLHDL 4 04/14/2014     Assessment & Plan  Essential hypertension Well controlled, no changes to meds. Encouraged heart healthy diet such as the DASH diet and exercise as tolerated.    GERD (gastroesophageal reflux disease) Avoid offending foods, start probiotics. Do not eat large meals in late evening and consider raising head of bed.    Hyperlipidemia, mixed Encouraged heart healthy diet, increase exercise, avoid trans fats, consider a krill oil cap daily   Obesity Encouraged DASH diet, decrease po intake and increase exercise as tolerated. Needs 7-8 hours of sleep nightly. Avoid trans fats, eat small, frequent meals every 4-5 hours with lean proteins, complex carbs and healthy fats. Minimize simple carbs, GMO foods.

## 2014-10-18 DIAGNOSIS — H66009 Acute suppurative otitis media without spontaneous rupture of ear drum, unspecified ear: Secondary | ICD-10-CM | POA: Diagnosis not present

## 2014-10-20 ENCOUNTER — Encounter: Payer: Self-pay | Admitting: Family Medicine

## 2014-10-20 DIAGNOSIS — E669 Obesity, unspecified: Secondary | ICD-10-CM | POA: Insufficient documentation

## 2014-10-20 HISTORY — DX: Obesity, unspecified: E66.9

## 2014-10-20 NOTE — Assessment & Plan Note (Signed)
Encouraged DASH diet, decrease po intake and increase exercise as tolerated. Needs 7-8 hours of sleep nightly. Avoid trans fats, eat small, frequent meals every 4-5 hours with lean proteins, complex carbs and healthy fats. Minimize simple carbs, GMO foods. 

## 2014-10-20 NOTE — Assessment & Plan Note (Signed)
Encouraged heart healthy diet, increase exercise, avoid trans fats, consider a krill oil cap daily 

## 2014-10-20 NOTE — Assessment & Plan Note (Signed)
Avoid offending foods, start probiotics. Do not eat large meals in late evening and consider raising head of bed.  

## 2014-11-10 DIAGNOSIS — Z08 Encounter for follow-up examination after completed treatment for malignant neoplasm: Secondary | ICD-10-CM | POA: Diagnosis not present

## 2014-11-10 DIAGNOSIS — Z85828 Personal history of other malignant neoplasm of skin: Secondary | ICD-10-CM | POA: Diagnosis not present

## 2014-11-10 DIAGNOSIS — B079 Viral wart, unspecified: Secondary | ICD-10-CM | POA: Diagnosis not present

## 2015-01-05 ENCOUNTER — Other Ambulatory Visit: Payer: Self-pay | Admitting: Family Medicine

## 2015-01-11 ENCOUNTER — Other Ambulatory Visit: Payer: Self-pay | Admitting: Family Medicine

## 2015-05-04 ENCOUNTER — Other Ambulatory Visit: Payer: Self-pay | Admitting: Family Medicine

## 2015-05-15 ENCOUNTER — Telehealth: Payer: Self-pay | Admitting: Behavioral Health

## 2015-05-15 NOTE — Telephone Encounter (Signed)
Unable to reach patient at time of Pre-Visit Call.  Left message for patient to return call when available.    

## 2015-05-18 ENCOUNTER — Encounter: Payer: Medicare Other | Admitting: Family Medicine

## 2015-05-18 ENCOUNTER — Telehealth: Payer: Self-pay | Admitting: Family Medicine

## 2015-05-18 NOTE — Telephone Encounter (Signed)
[facility] and patient would like to be seen before the new year, please advise.

## 2015-05-19 NOTE — Telephone Encounter (Signed)
Patient scheduled for 05/20/2015 at 9:30am

## 2015-05-20 ENCOUNTER — Ambulatory Visit: Payer: PRIVATE HEALTH INSURANCE

## 2015-05-20 ENCOUNTER — Ambulatory Visit (INDEPENDENT_AMBULATORY_CARE_PROVIDER_SITE_OTHER): Payer: Medicare Other | Admitting: Family Medicine

## 2015-05-20 ENCOUNTER — Encounter: Payer: Self-pay | Admitting: Family Medicine

## 2015-05-20 VITALS — BP 118/80 | HR 58 | Temp 98.1°F | Ht 75.0 in | Wt 233.0 lb

## 2015-05-20 DIAGNOSIS — E782 Mixed hyperlipidemia: Secondary | ICD-10-CM

## 2015-05-20 DIAGNOSIS — Z0000 Encounter for general adult medical examination without abnormal findings: Secondary | ICD-10-CM | POA: Diagnosis not present

## 2015-05-20 DIAGNOSIS — I10 Essential (primary) hypertension: Secondary | ICD-10-CM

## 2015-05-20 DIAGNOSIS — Z23 Encounter for immunization: Secondary | ICD-10-CM | POA: Diagnosis not present

## 2015-05-20 DIAGNOSIS — E669 Obesity, unspecified: Secondary | ICD-10-CM

## 2015-05-20 DIAGNOSIS — H547 Unspecified visual loss: Secondary | ICD-10-CM | POA: Diagnosis not present

## 2015-05-20 DIAGNOSIS — K219 Gastro-esophageal reflux disease without esophagitis: Secondary | ICD-10-CM

## 2015-05-20 LAB — TSH: TSH: 1.15 u[IU]/mL (ref 0.35–4.50)

## 2015-05-20 LAB — COMPREHENSIVE METABOLIC PANEL
ALT: 15 U/L (ref 0–53)
AST: 19 U/L (ref 0–37)
Albumin: 3.9 g/dL (ref 3.5–5.2)
Alkaline Phosphatase: 66 U/L (ref 39–117)
BUN: 16 mg/dL (ref 6–23)
CHLORIDE: 109 meq/L (ref 96–112)
CO2: 27 meq/L (ref 19–32)
Calcium: 8.9 mg/dL (ref 8.4–10.5)
Creatinine, Ser: 1.16 mg/dL (ref 0.40–1.50)
GFR: 65.52 mL/min (ref >60.00)
Glucose, Bld: 91 mg/dL (ref 70–99)
POTASSIUM: 4.2 meq/L (ref 3.5–5.1)
Sodium: 141 mEq/L (ref 135–145)
Total Bilirubin: 0.8 mg/dL (ref 0.2–1.2)
Total Protein: 7.1 g/dL (ref 6.0–8.3)

## 2015-05-20 LAB — LIPID PANEL
CHOL/HDL RATIO: 3
CHOLESTEROL: 175 mg/dL (ref 0–200)
HDL: 52.8 mg/dL (ref >39.00)
LDL Cholesterol: 108 mg/dL — ABNORMAL HIGH (ref 0–99)
NonHDL: 122.02
TRIGLYCERIDES: 69 mg/dL (ref 0.0–149.0)
VLDL: 13.8 mg/dL (ref 0.0–40.0)

## 2015-05-20 LAB — CBC
HEMATOCRIT: 42.3 % (ref 39.0–52.0)
Hemoglobin: 13.6 g/dL (ref 13.0–17.0)
MCHC: 32.2 g/dL (ref 30.0–36.0)
MCV: 96.6 fl (ref 78.0–100.0)
Platelets: 233 10*3/uL (ref 150.0–400.0)
RBC: 4.38 Mil/uL (ref 4.22–5.81)
RDW: 13.7 % (ref 11.5–15.5)
WBC: 6.4 10*3/uL (ref 4.0–10.5)

## 2015-05-20 NOTE — Patient Instructions (Addendum)
Vitamin D 2000 IU daily Flaxseed oil or Krill oil daily Curcumen daily Exercise daily, 6-8 hours of sleep    Zyrtec 10 mg daily as needed for allergies and simply saline after yard work  Preventive Care for Adults, Male A healthy lifestyle and preventive care can promote health and wellness. Preventive health guidelines for men include the following key practices:  A routine yearly physical is a good way to check with your health care provider about your health and preventative screening. It is a chance to share any concerns and updates on your health and to receive a thorough exam.  Visit your dentist for a routine exam and preventative care every 6 months. Brush your teeth twice a day and floss once a day. Good oral hygiene prevents tooth decay and gum disease.  The frequency of eye exams is based on your age, health, family medical history, use of contact lenses, and other factors. Follow your health care provider's recommendations for frequency of eye exams.  Eat a healthy diet. Foods such as vegetables, fruits, whole grains, low-fat dairy products, and lean protein foods contain the nutrients you need without too many calories. Decrease your intake of foods high in solid fats, added sugars, and salt. Eat the right amount of calories for you.Get information about a proper diet from your health care provider, if necessary.  Regular physical exercise is one of the most important things you can do for your health. Most adults should get at least 150 minutes of moderate-intensity exercise (any activity that increases your heart rate and causes you to sweat) each week. In addition, most adults need muscle-strengthening exercises on 2 or more days a week.  Maintain a healthy weight. The body mass index (BMI) is a screening tool to identify possible weight problems. It provides an estimate of body fat based on height and weight. Your health care provider can find your BMI and can help you achieve  or maintain a healthy weight.For adults 20 years and older:  A BMI below 18.5 is considered underweight.  A BMI of 18.5 to 24.9 is normal.  A BMI of 25 to 29.9 is considered overweight.  A BMI of 30 and above is considered obese.  Maintain normal blood lipids and cholesterol levels by exercising and minimizing your intake of saturated fat. Eat a balanced diet with plenty of fruit and vegetables. Blood tests for lipids and cholesterol should begin at age 15 and be repeated every 5 years. If your lipid or cholesterol levels are high, you are over 50, or you are at high risk for heart disease, you may need your cholesterol levels checked more frequently.Ongoing high lipid and cholesterol levels should be treated with medicines if diet and exercise are not working.  If you smoke, find out from your health care provider how to quit. If you do not use tobacco, do not start.  Lung cancer screening is recommended for adults aged 71-80 years who are at high risk for developing lung cancer because of a history of smoking. A yearly low-dose CT scan of the lungs is recommended for people who have at least a 30-pack-year history of smoking and are a current smoker or have quit within the past 15 years. A pack year of smoking is smoking an average of 1 pack of cigarettes a day for 1 year (for example: 1 pack a day for 30 years or 2 packs a day for 15 years). Yearly screening should continue until the smoker has stopped smoking  for at least 15 years. Yearly screening should be stopped for people who develop a health problem that would prevent them from having lung cancer treatment.  If you choose to drink alcohol, do not have more than 2 drinks per day. One drink is considered to be 12 ounces (355 mL) of beer, 5 ounces (148 mL) of wine, or 1.5 ounces (44 mL) of liquor.  Avoid use of street drugs. Do not share needles with anyone. Ask for help if you need support or instructions about stopping the use of  drugs.  High blood pressure causes heart disease and increases the risk of stroke. Your blood pressure should be checked at least every 1-2 years. Ongoing high blood pressure should be treated with medicines, if weight loss and exercise are not effective.  If you are 42-21 years old, ask your health care provider if you should take aspirin to prevent heart disease.  Diabetes screening is done by taking a blood sample to check your blood glucose level after you have not eaten for a certain period of time (fasting). If you are not overweight and you do not have risk factors for diabetes, you should be screened once every 3 years starting at age 47. If you are overweight or obese and you are 72-54 years of age, you should be screened for diabetes every year as part of your cardiovascular risk assessment.  Colorectal cancer can be detected and often prevented. Most routine colorectal cancer screening begins at the age of 34 and continues through age 17. However, your health care provider may recommend screening at an earlier age if you have risk factors for colon cancer. On a yearly basis, your health care provider may provide home test kits to check for hidden blood in the stool. Use of a small camera at the end of a tube to directly examine the colon (sigmoidoscopy or colonoscopy) can detect the earliest forms of colorectal cancer. Talk to your health care provider about this at age 60, when routine screening begins. Direct exam of the colon should be repeated every 5-10 years through age 65, unless early forms of precancerous polyps or small growths are found.  People who are at an increased risk for hepatitis B should be screened for this virus. You are considered at high risk for hepatitis B if:  You were born in a country where hepatitis B occurs often. Talk with your health care provider about which countries are considered high risk.  Your parents were born in a high-risk country and you have not  received a shot to protect against hepatitis B (hepatitis B vaccine).  You have HIV or AIDS.  You use needles to inject street drugs.  You live with, or have sex with, someone who has hepatitis B.  You are a man who has sex with other men (MSM).  You get hemodialysis treatment.  You take certain medicines for conditions such as cancer, organ transplantation, and autoimmune conditions.  Hepatitis C blood testing is recommended for all people born from [birth year] through 1965 and any individual with known risks for hepatitis C.  Practice safe sex. Use condoms and avoid high-risk sexual practices to reduce the spread of sexually transmitted infections (STIs). STIs include gonorrhea, chlamydia, syphilis, trichomonas, herpes, HPV, and human immunodeficiency virus (HIV). Herpes, HIV, and HPV are viral illnesses that have no cure. They can result in disability, cancer, and death.  If you are a man who has sex with other men, you should be screened  at least once per year for:  HIV.  Urethral, rectal, and pharyngeal infection of gonorrhea, chlamydia, or both.  If you are at risk of being infected with HIV, it is recommended that you take a prescription medicine daily to prevent HIV infection. This is called preexposure prophylaxis (PrEP). You are considered at risk if:  You are a man who has sex with other men (MSM) and have other risk factors.  You are a heterosexual man, are sexually active, and are at increased risk for HIV infection.  You take drugs by injection.  You are sexually active with a partner who has HIV.  Talk with your health care provider about whether you are at high risk of being infected with HIV. If you choose to begin PrEP, you should first be tested for HIV. You should then be tested every 3 months for as long as you are taking PrEP.  A one-time screening for abdominal aortic aneurysm (AAA) and surgical repair of large AAAs by ultrasound are recommended for men ages 79 to  42 years who are current or former smokers.  Healthy men should no longer receive prostate-specific antigen (PSA) blood tests as part of routine cancer screening. Talk with your health care provider about prostate cancer screening.  Testicular cancer screening is not recommended for adult males who have no symptoms. Screening includes self-exam, a health care provider exam, and other screening tests. Consult with your health care provider about any symptoms you have or any concerns you have about testicular cancer.  Use sunscreen. Apply sunscreen liberally and repeatedly throughout the day. You should seek shade when your shadow is shorter than you. Protect yourself by wearing long sleeves, pants, a wide-brimmed hat, and sunglasses year round, whenever you are outdoors.  Once a month, do a whole-body skin exam, using a mirror to look at the skin on your back. Tell your health care provider about new moles, moles that have irregular borders, moles that are larger than a pencil eraser, or moles that have changed in shape or color.  Stay current with required vaccines (immunizations).  Influenza vaccine. All adults should be immunized every year.  Tetanus, diphtheria, and acellular pertussis (Td, Tdap) vaccine. An adult who has not previously received Tdap or who does not know his vaccine status should receive 1 dose of Tdap. This initial dose should be followed by tetanus and diphtheria toxoids (Td) booster doses every 10 years. Adults with an unknown or incomplete history of completing a 3-dose immunization series with Td-containing vaccines should begin or complete a primary immunization series including a Tdap dose. Adults should receive a Td booster every 10 years.  Varicella vaccine. An adult without evidence of immunity to varicella should receive 2 doses or a second dose if he has previously received 1 dose.  Human papillomavirus (HPV) vaccine. Males aged 11-21 years who have not received the  vaccine previously should receive the 3-dose series. Males aged 22-26 years may be immunized. Immunization is recommended through the age of 71 years for any male who has sex with males and did not get any or all doses earlier. Immunization is recommended for any person with an immunocompromised condition through the age of 75 years if he did not get any or all doses earlier. During the 3-dose series, the second dose should be obtained 4-8 weeks after the first dose. The third dose should be obtained 24 weeks after the first dose and 16 weeks after the second dose.  Zoster vaccine. One dose  is recommended for adults aged 83 years or older unless certain conditions are present.  Measles, mumps, and rubella (MMR) vaccine. Adults born before [birth year] generally are considered immune to measles and mumps. Adults born in [birth year] or later should have 1 or more doses of MMR vaccine unless there is a contraindication to the vaccine or there is laboratory evidence of immunity to each of the three diseases. A routine second dose of MMR vaccine should be obtained at least 28 days after the first dose for students attending postsecondary schools, health care workers, or international travelers. People who received inactivated measles vaccine or an unknown type of measles vaccine during 1963-1967 should receive 2 doses of MMR vaccine. People who received inactivated mumps vaccine or an unknown type of mumps vaccine before 1979 and are at high risk for mumps infection should consider immunization with 2 doses of MMR vaccine. Unvaccinated health care workers born before [birth year] who lack laboratory evidence of measles, mumps, or rubella immunity or laboratory confirmation of disease should consider measles and mumps immunization with 2 doses of MMR vaccine or rubella immunization with 1 dose of MMR vaccine.  Pneumococcal 13-valent conjugate (PCV13) vaccine. When indicated, a person who is uncertain of his immunization history and has no  record of immunization should receive the PCV13 vaccine. All adults 8 years of age and older should receive this vaccine. An adult aged 36 years or older who has certain medical conditions and has not been previously immunized should receive 1 dose of PCV13 vaccine. This PCV13 should be followed with a dose of pneumococcal polysaccharide (PPSV23) vaccine. Adults who are at high risk for pneumococcal disease should obtain the PPSV23 vaccine at least 8 weeks after the dose of PCV13 vaccine. Adults older than 73 years of age who have normal immune system function should obtain the PPSV23 vaccine dose at least 1 year after the dose of PCV13 vaccine.  Pneumococcal polysaccharide (PPSV23) vaccine. When PCV13 is also indicated, PCV13 should be obtained first. All adults aged 44 years and older should be immunized. An adult younger than age 14 years who has certain medical conditions should be immunized. Any person who resides in a nursing home or long-term care facility should be immunized. An adult smoker should be immunized. People with an immunocompromised condition and certain other conditions should receive both PCV13 and PPSV23 vaccines. People with human immunodeficiency virus (HIV) infection should be immunized as soon as possible after diagnosis. Immunization during chemotherapy or radiation therapy should be avoided. Routine use of PPSV23 vaccine is not recommended for American Indians, [place] Natives, or people younger than 65 years unless there are medical conditions that require PPSV23 vaccine. When indicated, people who have unknown immunization and have no record of immunization should receive PPSV23 vaccine. One-time revaccination 5 years after the first dose of PPSV23 is recommended for people aged 19-64 years who have chronic kidney failure, nephrotic syndrome, asplenia, or immunocompromised conditions. People who received 1-2 doses of PPSV23 before age 32 years should receive another dose of PPSV23  vaccine at age 36 years or later if at least 5 years have passed since the previous dose. Doses of PPSV23 are not needed for people immunized with PPSV23 at or after age 9 years.  Meningococcal vaccine. Adults with asplenia or persistent complement component deficiencies should receive 2 doses of quadrivalent meningococcal conjugate (MenACWY-D) vaccine. The doses should be obtained at least 2 months apart. Microbiologists working with certain meningococcal bacteria, [employer] recruits, people at risk during an outbreak,  and people who travel to or live in countries with a high rate of meningitis should be immunized. A first-year college student up through age 43 years who is living in a residence hall should receive a dose if he did not receive a dose on or after his 16th birthday. Adults who have certain high-risk conditions should receive one or more doses of vaccine.  Hepatitis A vaccine. Adults who wish to be protected from this disease, have chronic liver disease, work with hepatitis A-infected animals, work in hepatitis A research labs, or travel to or work in countries with a high rate of hepatitis A should be immunized. Adults who were previously unvaccinated and who anticipate close contact with an international adoptee during the first 60 days after arrival in the [place] from a country with a high rate of hepatitis A should be immunized.  Hepatitis B vaccine. Adults should be immunized if they wish to be protected from this disease, are under age 35 years and have diabetes, have chronic liver disease, have had more than one sex partner in the past 6 months, may be exposed to blood or other infectious body fluids, are household contacts or sex partners of hepatitis B positive people, are clients or workers in certain care facilities, or travel to or work in countries with a high rate of hepatitis B.  Haemophilus influenzae type b (Hib) vaccine. A previously unvaccinated person with asplenia  or sickle cell disease or having a scheduled splenectomy should receive 1 dose of Hib vaccine. Regardless of previous immunization, a recipient of a hematopoietic stem cell transplant should receive a 3-dose series 6-12 months after his successful transplant. Hib vaccine is not recommended for adults with HIV infection. Preventive Service / Frequency Ages 12 to 68  Blood pressure check.** / Every 3-5 years.  Lipid and cholesterol check.** / Every 5 years beginning at age 71.  Hepatitis C blood test.** / For any individual with known risks for hepatitis C.  Skin self-exam. / Monthly.  Influenza vaccine. / Every year.  Tetanus, diphtheria, and acellular pertussis (Tdap, Td) vaccine.** / Consult your health care provider. 1 dose of Td every 10 years.  Varicella vaccine.** / Consult your health care provider.  HPV vaccine. / 3 doses over 6 months, if 1 or younger.  Measles, mumps, rubella (MMR) vaccine.** / You need at least 1 dose of MMR if you were born in 1957 or later. You may also need a second dose.  Pneumococcal 13-valent conjugate (PCV13) vaccine.** / Consult your health care provider.  Pneumococcal polysaccharide (PPSV23) vaccine.** / 1 to 2 doses if you smoke cigarettes or if you have certain conditions.  Meningococcal vaccine.** / 1 dose if you are age 66 to 25 years and a Market researcher living in a residence hall, or have one of several medical conditions. You may also need additional booster doses.  Hepatitis A vaccine.** / Consult your health care provider.  Hepatitis B vaccine.** / Consult your health care provider.  Haemophilus influenzae type b (Hib) vaccine.** / Consult your health care provider. Ages 10 to 74  Blood pressure check.** / Every year.  Lipid and cholesterol check.** / Every 5 years beginning at age 55.  Lung cancer screening. / Every year if you are aged 26-80 years and have a 30-pack-year history of smoking and currently smoke or have  quit within the past 15 years. Yearly screening is stopped once you have quit smoking for at least 15 years or develop a  health problem that would prevent you from having lung cancer treatment.  Fecal occult blood test (FOBT) of stool. / Every year beginning at age 65 and continuing until age 68. You may not have to do this test if you get a colonoscopy every 10 years.  Flexible sigmoidoscopy** or colonoscopy.** / Every 5 years for a flexible sigmoidoscopy or every 10 years for a colonoscopy beginning at age 50 and continuing until age 76.  Hepatitis C blood test.** / For all people born from [birth year] through 1965 and any individual with known risks for hepatitis C.  Skin self-exam. / Monthly.  Influenza vaccine. / Every year.  Tetanus, diphtheria, and acellular pertussis (Tdap/Td) vaccine.** / Consult your health care provider. 1 dose of Td every 10 years.  Varicella vaccine.** / Consult your health care provider.  Zoster vaccine.** / 1 dose for adults aged 73 years or older.  Measles, mumps, rubella (MMR) vaccine.** / You need at least 1 dose of MMR if you were born in 1956 or later. You may also need a second dose.  Pneumococcal 13-valent conjugate (PCV13) vaccine.** / Consult your health care provider.  Pneumococcal polysaccharide (PPSV23) vaccine.** / 1 to 2 doses if you smoke cigarettes or if you have certain conditions.  Meningococcal vaccine.** / Consult your health care provider.  Hepatitis A vaccine.** / Consult your health care provider.  Hepatitis B vaccine.** / Consult your health care provider.  Haemophilus influenzae type b (Hib) vaccine.** / Consult your health care provider. Ages 61 and over  Blood pressure check.** / Every year.  Lipid and cholesterol check.**/ Every 5 years beginning at age 91.  Lung cancer screening. / Every year if you are aged 61-80 years and have a 30-pack-year history of smoking and currently smoke or have quit within the past 15 years. Yearly  screening is stopped once you have quit smoking for at least 15 years or develop a health problem that would prevent you from having lung cancer treatment.  Fecal occult blood test (FOBT) of stool. / Every year beginning at age 65 and continuing until age 17. You may not have to do this test if you get a colonoscopy every 10 years.  Flexible sigmoidoscopy** or colonoscopy.** / Every 5 years for a flexible sigmoidoscopy or every 10 years for a colonoscopy beginning at age 5 and continuing until age 52.  Hepatitis C blood test.** / For all people born from [birth year] through 1964 and any individual with known risks for hepatitis C.  Abdominal aortic aneurysm (AAA) screening.** / A one-time screening for ages 50 to 51 years who are current or former smokers.  Skin self-exam. / Monthly.  Influenza vaccine. / Every year.  Tetanus, diphtheria, and acellular pertussis (Tdap/Td) vaccine.** / 1 dose of Td every 10 years.  Varicella vaccine.** / Consult your health care provider.  Zoster vaccine.** / 1 dose for adults aged 42 years or older.  Pneumococcal 13-valent conjugate (PCV13) vaccine.** / 1 dose for all adults aged 18 years and older.  Pneumococcal polysaccharide (PPSV23) vaccine.** / 1 dose for all adults aged 19 years and older.  Meningococcal vaccine.** / Consult your health care provider.  Hepatitis A vaccine.** / Consult your health care provider.  Hepatitis B vaccine.** / Consult your health care provider.  Haemophilus influenzae type b (Hib) vaccine.** / Consult your health care provider. **Family history and personal history of risk and conditions may change your health care provider's recommendations.   This information is not intended to replace advice  given to you by your health care provider. Make sure you discuss any questions you have with your health care provider.   Document Released: 08/16/2001 Document Revised: 07/11/2014 Document Reviewed: 11/15/2010 Elsevier  Interactive Patient Education [employer].

## 2015-05-20 NOTE — Progress Notes (Signed)
Pre visit review using our clinic review tool, if applicable. No additional management support is needed unless otherwise documented below in the visit note. 

## 2015-05-25 DIAGNOSIS — B079 Viral wart, unspecified: Secondary | ICD-10-CM | POA: Diagnosis not present

## 2015-05-25 DIAGNOSIS — L821 Other seborrheic keratosis: Secondary | ICD-10-CM | POA: Diagnosis not present

## 2015-05-29 ENCOUNTER — Encounter: Payer: Self-pay | Admitting: Family Medicine

## 2015-05-29 NOTE — Assessment & Plan Note (Signed)
Patient denies any difficulties at home. No trouble with ADLs, depression or falls. See EMR for functional status screen and depression screen. No recent changes to vision or hearing. Is UTD with immunizations. Is UTD with screening. Discussed Advanced Directives. Encouraged heart healthy diet, exercise as tolerated and adequate sleep. See patient's problem list for health risk factors to monitor. See AVS for preventative healthcare recommendation schedule.  Sees Dr Craige CottaKirby, [facility]

## 2015-05-29 NOTE — Assessment & Plan Note (Signed)
Avoid offending foods, start probiotics. Do not eat large meals in late evening and consider raising head of bed.  

## 2015-05-29 NOTE — Assessment & Plan Note (Signed)
Encouraged heart healthy diet, increase exercise, avoid trans fats, consider a krill oil cap daily 

## 2015-05-29 NOTE — Assessment & Plan Note (Signed)
Encouraged DASH diet, decrease po intake and increase exercise as tolerated. Needs 7-8 hours of sleep nightly. Avoid trans fats, eat small, frequent meals every 4-5 hours with lean proteins, complex carbs and healthy fats. Minimize simple carbs, GMO foods. 

## 2015-05-29 NOTE — Progress Notes (Signed)
Subjective:    Patient ID: Anthony Poole, male    DOB: Jul 04, 1941, 74 y.o.   MRN: [id number]  Chief Complaint  Patient presents with  . Annual Exam    HPI Patient is in today for annual exam. He is feeling fairly well although he has had trouble with nasal congestion and a mild cough the last few months. No fevers or chills. Has been doing well with activities of daily living at home. No recent hospitalization or other acute illness. Is trying to maintain a heart healthy diet and stay active. Denies CP/palp/SOB/HA/congestion/fevers/GI or GU c/o. Taking meds as prescribed  Past Medical History  Diagnosis Date  . Hypertension   . GERD (gastroesophageal reflux disease)   . Preventative health care 07/29/2012  . Anemia 07/29/2012  . Sinusitis, acute 09/05/2012  . Chronic knee pain 11/21/2012    Left s/p torn cartilage  . Shingles 12/12/2012  . Other and unspecified hyperlipidemia 04/14/2013  . Medicare annual wellness visit, subsequent 04/14/2014  . Headache 04/20/2014  . Tachycardia 10/14/2014  . Obesity 10/20/2014    Past Surgical History  Procedure Laterality Date  . Cholecystectomy      Family History  Problem Relation Age of Onset  . COPD Mother     worked in [employer], clots  . Peripheral vascular disease Mother   . Dementia Father   . Peripheral vascular disease Brother     Social History   Social History  . Marital Status: Married    Spouse Name: N/A  . Number of Children: N/A  . Years of Education: N/A   Occupational History  . Not on file.   Social History Main Topics  . Smoking status: Never Smoker   . Smokeless tobacco: Never Used  . Alcohol Use: No  . Drug Use: No  . Sexual Activity: No     Comment: lives with wife, no dietary restrictions,  no fish   Other Topics Concern  . Not on file   Social History Narrative    Outpatient Prescriptions Prior to Visit  Medication Sig Dispense Refill  . amLODipine (NORVASC) 5 MG tablet TAKE 1 TABLET BY  MOUTH EVERY DAY 30 tablet 5  . aspirin 81 MG tablet Take 81 mg by mouth 2 (two) times daily.    Marland Kitchen. lisinopril (PRINIVIL,ZESTRIL) 40 MG tablet TAKE 1 TABLET BY MOUTH EVERY DAY 30 tablet 3  . MAGNESIUM PO Take by mouth daily.    . Misc Natural Products (OSTEO BI-FLEX ADV JOINT SHIELD PO) Take by mouth daily.    . Multiple Vitamins-Minerals (CENTRUM SILVER ADULT 50+ PO) Take by mouth daily.    Marland Kitchen. NEXIUM 40 MG capsule TAKE ONE CAPSULE BY MOUTH EVERY DAY AT 12 NOON (Patient not taking: Reported on 05/20/2015) 30 capsule 3   No facility-administered medications prior to visit.    Allergies  Allergen Reactions  . Fish Allergy Itching  . Propulsid [Cisapride] Hives    Review of Systems  Constitutional: Negative for fever, chills and malaise/fatigue.  HENT: Positive for congestion. Negative for hearing loss.   Eyes: Negative for discharge.  Respiratory: Negative for cough, sputum production and shortness of breath.   Cardiovascular: Negative for chest pain, palpitations and leg swelling.  Gastrointestinal: Negative for heartburn, nausea, vomiting, abdominal pain, diarrhea, constipation and blood in stool.  Genitourinary: Negative for dysuria, urgency, frequency and hematuria.  Musculoskeletal: Negative for myalgias, back pain and falls.  Skin: Negative for rash.  Neurological: Negative for dizziness, sensory change, loss  of consciousness, weakness and headaches.  Endo/Heme/Allergies: Negative for environmental allergies. Does not bruise/bleed easily.  Psychiatric/Behavioral: Negative for depression and suicidal ideas. The patient is not nervous/anxious and does not have insomnia.        Objective:    Physical Exam  Constitutional: He is oriented to person, place, and time. He appears well-developed and well-nourished. No distress.  HENT:  Head: Normocephalic and atraumatic.  Nose: Nose normal.  Eyes: Right eye exhibits no discharge. Left eye exhibits no discharge.  Neck: Normal range of  motion. Neck supple.  Cardiovascular: Normal rate and regular rhythm.   No murmur heard. Pulmonary/Chest: Effort normal and breath sounds normal.  Abdominal: Soft. Bowel sounds are normal. There is no tenderness.  Musculoskeletal: He exhibits no edema.  Neurological: He is alert and oriented to person, place, and time.  Skin: Skin is warm and dry.  Psychiatric: He has a normal mood and affect.  Nursing note and vitals reviewed.   BP 118/80 mmHg  Pulse 58  Temp(Src) 98.1 F (36.7 C) (Oral)  Ht  (1.905 m)  Wt 233 lb (105.688 kg)  BMI 29.12 kg/m2  SpO2 98% Wt Readings from Last 3 Encounters:  05/20/15 233 lb (105.688 kg)  10/14/14 225 lb (102.059 kg)  04/14/14 227 lb 3.2 oz (103.057 kg)     Lab Results  Component Value Date   WBC 6.4 05/20/2015   HGB 13.6 05/20/2015   HCT 42.3 05/20/2015   PLT 233.0 05/20/2015   GLUCOSE 91 05/20/2015   CHOL 175 05/20/2015   TRIG 69.0 05/20/2015   HDL 52.80 05/20/2015   LDLCALC 108* 05/20/2015   ALT 15 05/20/2015   AST 19 05/20/2015   NA 141 05/20/2015   K 4.2 05/20/2015   CL 109 05/20/2015   CREATININE 1.16 05/20/2015   BUN 16 05/20/2015   CO2 27 05/20/2015   TSH 1.15 05/20/2015   PSA 0.47 04/14/2014    Lab Results  Component Value Date   TSH 1.15 05/20/2015   Lab Results  Component Value Date   WBC 6.4 05/20/2015   HGB 13.6 05/20/2015   HCT 42.3 05/20/2015   MCV 96.6 05/20/2015   PLT 233.0 05/20/2015   Lab Results  Component Value Date   NA 141 05/20/2015   K 4.2 05/20/2015   CO2 27 05/20/2015   GLUCOSE 91 05/20/2015   BUN 16 05/20/2015   CREATININE 1.16 05/20/2015   BILITOT 0.8 05/20/2015   ALKPHOS 66 05/20/2015   AST 19 05/20/2015   ALT 15 05/20/2015   PROT 7.1 05/20/2015   ALBUMIN 3.9 05/20/2015   CALCIUM 8.9 05/20/2015   GFR 65.52 05/20/2015   Lab Results  Component Value Date   CHOL 175 05/20/2015   Lab Results  Component Value Date   HDL 52.80 05/20/2015   Lab Results  Component Value  Date   LDLCALC 108* 05/20/2015   Lab Results  Component Value Date   TRIG 69.0 05/20/2015   Lab Results  Component Value Date   CHOLHDL 3 05/20/2015   No results found for: HGBA1C     Assessment & Plan:   Problem List Items Addressed This Visit    Essential hypertension    Well controlled, no changes to meds. Encouraged heart healthy diet such as the DASH diet and exercise as tolerated.       GERD (gastroesophageal reflux disease)    Avoid offending foods, start probiotics. Do not eat large meals in late evening and consider raising head  of bed.       Hyperlipidemia, mixed    Encouraged heart healthy diet, increase exercise, avoid trans fats, consider a krill oil cap daily      Relevant Orders   TSH (Completed)   CBC (Completed)   Comprehensive metabolic panel (Completed)   Lipid panel (Completed)   Ambulatory referral to Ophthalmology   Medicare annual wellness visit, subsequent    Patient denies any difficulties at home. No trouble with ADLs, depression or falls. See EMR for functional status screen and depression screen. No recent changes to vision or hearing. Is UTD with immunizations. Is UTD with screening. Discussed Advanced Directives. Encouraged heart healthy diet, exercise as tolerated and adequate sleep. See patient's problem list for health risk factors to monitor. See AVS for preventative healthcare recommendation schedule.  Sees Dr Craige Cotta, dermatology at [facility]       Obesity    Encouraged DASH diet, decrease po intake and increase exercise as tolerated. Needs 7-8 hours of sleep nightly. Avoid trans fats, eat small, frequent meals every 4-5 hours with lean proteins, complex carbs and healthy fats. Minimize simple carbs, GMO foods.       Other Visit Diagnoses    Benign essential HTN    -  Primary    Relevant Orders    TSH (Completed)    CBC (Completed)    Comprehensive metabolic panel (Completed)    Lipid panel (Completed)     Ambulatory referral to Ophthalmology    Decreased visual acuity        Relevant Orders    TSH (Completed)    CBC (Completed)    Comprehensive metabolic panel (Completed)    Lipid panel (Completed)    Ambulatory referral to Ophthalmology    Encounter for immunization           I have discontinued Mr. Wiechmann's NEXIUM. I am also having him maintain his Multiple Vitamins-Minerals (CENTRUM SILVER ADULT 50+ PO), Misc Natural Products (OSTEO BI-FLEX ADV JOINT SHIELD PO), MAGNESIUM PO, aspirin, amLODipine, and lisinopril.  No orders of the defined types were placed in this encounter.     [name], MD

## 2015-05-29 NOTE — Assessment & Plan Note (Signed)
Well controlled, no changes to meds. Encouraged heart healthy diet such as the DASH diet and exercise as tolerated.  °

## 2015-06-25 ENCOUNTER — Other Ambulatory Visit: Payer: Self-pay | Admitting: Family Medicine

## 2015-07-03 ENCOUNTER — Other Ambulatory Visit: Payer: Self-pay | Admitting: *Deleted

## 2015-07-03 MED ORDER — LISINOPRIL 40 MG PO TABS: 40.0000 mg | ORAL_TABLET | Freq: Every day | ORAL | Status: DC

## 2015-07-03 NOTE — Telephone Encounter (Signed)
Rx sent to the pharmacy by e-script.//AB/CMA 

## 2015-07-28 DIAGNOSIS — T1502XA Foreign body in cornea, left eye, initial encounter: Secondary | ICD-10-CM | POA: Diagnosis not present

## 2015-08-10 ENCOUNTER — Ambulatory Visit (INDEPENDENT_AMBULATORY_CARE_PROVIDER_SITE_OTHER): Payer: Medicare Other | Admitting: Physician Assistant

## 2015-08-10 ENCOUNTER — Encounter: Payer: Self-pay | Admitting: Physician Assistant

## 2015-08-10 VITALS — BP 140/74 | HR 60 | Temp 97.9°F | Ht 75.0 in | Wt 234.4 lb

## 2015-08-10 DIAGNOSIS — B070 Plantar wart: Secondary | ICD-10-CM | POA: Diagnosis not present

## 2015-08-10 DIAGNOSIS — M7989 Other specified soft tissue disorders: Secondary | ICD-10-CM | POA: Diagnosis not present

## 2015-08-10 HISTORY — DX: Other specified soft tissue disorders: M79.89

## 2015-08-10 NOTE — Progress Notes (Signed)
Patient presents to clinic today c/o an episode of mild swelling of anterior L foot over the past week.  Endorses chronic swelling of L toe over the past several years. Endorses noticing a lesion on the mid plantar surface of L foot. Denies pain or pruritus. Is currently on amlodipine but denies history of foot and ankle swelling with that medication.  Past Medical History  Diagnosis Date  . Hypertension   . GERD (gastroesophageal reflux disease)   . Preventative health care 07/29/2012  . Anemia 07/29/2012  . Sinusitis, acute 09/05/2012  . Chronic knee pain 11/21/2012    Left s/p torn cartilage  . Shingles 12/12/2012  . Other and unspecified hyperlipidemia 04/14/2013  . Medicare annual wellness visit, subsequent 04/14/2014  . Headache 04/20/2014  . Tachycardia 10/14/2014  . Obesity 10/20/2014    Current Outpatient Prescriptions on File Prior to Visit  Medication Sig Dispense Refill  . amLODipine (NORVASC) 5 MG tablet TAKE 1 TABLET BY MOUTH EVERY DAY 30 tablet 5  . aspirin 81 MG tablet Take 81 mg by mouth 2 (two) times daily.    Marland Kitchen lisinopril (PRINIVIL,ZESTRIL) 40 MG tablet Take 1 tablet (40 mg total) by mouth daily. 90 tablet 1  . MAGNESIUM PO Take by mouth daily.    . Misc Natural Products (OSTEO BI-FLEX ADV JOINT SHIELD PO) Take by mouth daily.    . Multiple Vitamins-Minerals (CENTRUM SILVER ADULT 50+ PO) Take by mouth daily.     No current facility-administered medications on file prior to visit.    Allergies  Allergen Reactions  . Fish Allergy Itching  . Propulsid [Cisapride] Hives    Family History  Problem Relation Age of Onset  . COPD Mother     worked in [employer], clots  . Peripheral vascular disease Mother   . Dementia Father   . Peripheral vascular disease Brother     Social History   Social History  . Marital Status: Married    Spouse Name: N/A  . Number of Children: N/A  . Years of Education: N/A   Social History Main Topics  . Smoking status: Never  Smoker   . Smokeless tobacco: Never Used  . Alcohol Use: No  . Drug Use: No  . Sexual Activity: No     Comment: lives with wife, no dietary restrictions,  no fish   Other Topics Concern  . None   Social History Narrative    Review of Systems - See HPI.  All other ROS are negative.  BP 140/74 mmHg  Pulse 60  Temp(Src) 97.9 F (36.6 C) (Oral)  Ht 6\' 3"  (1.905 m)  Wt 234 lb 6.4 oz (106.323 kg)  BMI 29.30 kg/m2  SpO2 99%  Physical Exam  Constitutional: He is oriented to person, place, and time and well-developed, well-nourished, and in no distress.  HENT:  Head: Normocephalic and atraumatic.  Eyes: Conjunctivae are normal.  Cardiovascular: Normal rate, regular rhythm, normal heart sounds and intact distal pulses.   Pulses:      Dorsalis pedis pulses are 2+ on the right side, and 2+ on the left side.       Posterior tibial pulses are 2+ on the right side, and 2+ on the left side.  No noted peripheral edema  Pulmonary/Chest: Effort normal and breath sounds normal. No respiratory distress. He has no wheezes. He has no rales. He exhibits no tenderness.  Neurological: He is alert and oriented to person, place, and time.  Skin: Skin is warm  and dry.     Vitals reviewed.   Recent Results (from the past 2160 hour(s))  TSH     Status: None   Collection Time: 05/20/15 11:11 AM  Result Value Ref Range   TSH 1.15 0.35 - 4.50 uIU/mL  CBC     Status: None   Collection Time: 05/20/15 11:11 AM  Result Value Ref Range   WBC 6.4 4.0 - 10.5 K/uL   RBC 4.38 4.22 - 5.81 Mil/uL   Platelets 233.0 150.0 - 400.0 K/uL   Hemoglobin 13.6 13.0 - 17.0 g/dL   HCT 60.4 54.0 - 98.1 %   MCV 96.6 78.0 - 100.0 fl   MCHC 32.2 30.0 - 36.0 g/dL   RDW 19.1 47.8 - 29.5 %  Comprehensive metabolic panel     Status: None   Collection Time: 05/20/15 11:11 AM  Result Value Ref Range   Sodium 141 135 - 145 mEq/L   Potassium 4.2 3.5 - 5.1 mEq/L   Chloride 109 96 - 112 mEq/L   CO2 27 19 - 32 mEq/L    Glucose, Bld 91 70 - 99 mg/dL   BUN 16 6 - 23 mg/dL   Creatinine, Ser 6.21 0.40 - 1.50 mg/dL   Total Bilirubin 0.8 0.2 - 1.2 mg/dL   Alkaline Phosphatase 66 39 - 117 U/L   AST 19 0 - 37 U/L   ALT 15 0 - 53 U/L   Total Protein 7.1 6.0 - 8.3 g/dL   Albumin 3.9 3.5 - 5.2 g/dL   Calcium 8.9 8.4 - 30.8 mg/dL   GFR 65.78 >46.96 mL/min  Lipid panel     Status: Abnormal   Collection Time: 05/20/15 11:11 AM  Result Value Ref Range   Cholesterol 175 0 - 200 mg/dL    Comment: ATP III Classification       Desirable:  < 200 mg/dL               Borderline High:  200 - 239 mg/dL          High:  > = 295 mg/dL   Triglycerides 28.4 0.0 - 149.0 mg/dL    Comment: Normal:  <132 mg/dLBorderline High:  150 - 199 mg/dL   HDL 44.01 >02.72 mg/dL   VLDL 53.6 0.0 - 64.4 mg/dL   LDL Cholesterol 034 (H) 0 - 99 mg/dL   Total CHOL/HDL Ratio 3     Comment:                Men          Women1/2 Average Risk     3.4          3.3Average Risk          5.0          4.42X Average Risk          9.6          7.13X Average Risk          15.0          11.0                       NonHDL 122.02     Comment: NOTE:  Non-HDL goal should be 30 mg/dL higher than patient's LDL goal (i.e. LDL goal of < 70 mg/dL, would have non-HDL goal of < 100 mg/dL)    Assessment/Plan: Foot swelling Patient endorses an episode. No swelling today. ROM intact. Pulses 2+ and equal. Sensation  intact. Discussed low-salt diet and appropriate footwear. Plantar wart noted of left foot. Cryotherapy applied x 1.

## 2015-08-10 NOTE — Assessment & Plan Note (Signed)
Patient endorses an episode. No swelling today. ROM intact. Pulses 2+ and equal. Sensation intact. Discussed low-salt diet and appropriate footwear. Plantar wart noted of left foot. Cryotherapy applied x 1.

## 2015-08-10 NOTE — Patient Instructions (Signed)
Please continue medications as directed. Start a low-salt diet. Elevate legs while resting.   Follow-up with Dr. Abner Greenspan of myself if symptoms are not improving.

## 2015-08-10 NOTE — Progress Notes (Signed)
Pre visit review using our clinic review tool, if applicable. No additional management support is needed unless otherwise documented below in the visit note. 

## 2015-09-03 DIAGNOSIS — M79672 Pain in left foot: Secondary | ICD-10-CM | POA: Diagnosis not present

## 2015-11-19 ENCOUNTER — Encounter: Payer: Self-pay | Admitting: Family Medicine

## 2015-11-19 ENCOUNTER — Other Ambulatory Visit: Payer: Self-pay | Admitting: Family Medicine

## 2015-11-19 ENCOUNTER — Ambulatory Visit (INDEPENDENT_AMBULATORY_CARE_PROVIDER_SITE_OTHER): Payer: Medicare Other | Admitting: Family Medicine

## 2015-11-19 VITALS — BP 124/80 | HR 66 | Temp 98.1°F | Ht 76.0 in | Wt 229.0 lb

## 2015-11-19 DIAGNOSIS — S40861A Insect bite (nonvenomous) of right upper arm, initial encounter: Secondary | ICD-10-CM

## 2015-11-19 DIAGNOSIS — E782 Mixed hyperlipidemia: Secondary | ICD-10-CM

## 2015-11-19 DIAGNOSIS — W57XXXA Bitten or stung by nonvenomous insect and other nonvenomous arthropods, initial encounter: Secondary | ICD-10-CM

## 2015-11-19 DIAGNOSIS — R21 Rash and other nonspecific skin eruption: Secondary | ICD-10-CM

## 2015-11-19 DIAGNOSIS — T148 Other injury of unspecified body region: Secondary | ICD-10-CM | POA: Diagnosis not present

## 2015-11-19 DIAGNOSIS — E669 Obesity, unspecified: Secondary | ICD-10-CM

## 2015-11-19 DIAGNOSIS — I10 Essential (primary) hypertension: Secondary | ICD-10-CM | POA: Diagnosis not present

## 2015-11-19 HISTORY — DX: Rash and other nonspecific skin eruption: R21

## 2015-11-19 HISTORY — DX: Bitten or stung by nonvenomous insect and other nonvenomous arthropods, initial encounter: W57.XXXA

## 2015-11-19 LAB — COMPREHENSIVE METABOLIC PANEL
ALT: 15 U/L (ref 0–53)
AST: 19 U/L (ref 0–37)
Albumin: 4 g/dL (ref 3.5–5.2)
Alkaline Phosphatase: 72 U/L (ref 39–117)
BILIRUBIN TOTAL: 0.5 mg/dL (ref 0.2–1.2)
BUN: 18 mg/dL (ref 6–23)
CHLORIDE: 109 meq/L (ref 96–112)
CO2: 25 meq/L (ref 19–32)
CREATININE: 1.32 mg/dL (ref 0.40–1.50)
Calcium: 8.7 mg/dL (ref 8.4–10.5)
GFR: 56.37 mL/min — ABNORMAL LOW (ref >60.00)
GLUCOSE: 119 mg/dL — AB (ref 70–99)
Potassium: 4.3 mEq/L (ref 3.5–5.1)
SODIUM: 141 meq/L (ref 135–145)
Total Protein: 7 g/dL (ref 6.0–8.3)

## 2015-11-19 MED ORDER — AMLODIPINE BESYLATE 5 MG PO TABS: 5.0000 mg | ORAL_TABLET | Freq: Every day | ORAL | Status: DC

## 2015-11-19 MED ORDER — LISINOPRIL 40 MG PO TABS: 40.0000 mg | ORAL_TABLET | Freq: Every day | ORAL | Status: DC

## 2015-11-19 MED ORDER — LOSARTAN POTASSIUM 50 MG PO TABS
50.0000 mg | ORAL_TABLET | Freq: Every day | ORAL | Status: DC
Start: 2015-11-19 — End: 2016-05-12

## 2015-11-19 NOTE — Progress Notes (Signed)
Subjective:    Patient ID: Anthony Poole, male    DOB: 01-Dec-1940, 75 y.o.   MRN: [id number]  Chief Complaint  Patient presents with  . Follow-up    HPI Patient is in today for follow up. Is doing well today. Does note on right arm has 2 itchy bumps this week. No pain or fever. Otherwise feels well although his wife is concerned about a slightly cough she worries is his Lisinopril. Nonproductive and intermittent. Denies CP/palp/SOB/HA/congestion/fevers/GI or GU c/o. Taking meds as prescribed  Past Medical History  Diagnosis Date  . Hypertension   . GERD (gastroesophageal reflux disease)   . Preventative health care 07/29/2012  . Anemia 07/29/2012  . Sinusitis, acute 09/05/2012  . Chronic knee pain 11/21/2012    Left s/p torn cartilage  . Shingles 12/12/2012  . Other and unspecified hyperlipidemia 04/14/2013  . Medicare annual wellness visit, subsequent 04/14/2014  . Headache 04/20/2014  . Tachycardia 10/14/2014  . Obesity 10/20/2014  . Tick bite 11/19/2015    Past Surgical History  Procedure Laterality Date  . Cholecystectomy      Family History  Problem Relation Age of Onset  . COPD Mother     worked in [employer], clots  . Peripheral vascular disease Mother   . Dementia Father   . Peripheral vascular disease Brother     Social History   Social History  . Marital Status: Married    Spouse Name: N/A  . Number of Children: N/A  . Years of Education: N/A   Occupational History  . Not on file.   Social History Main Topics  . Smoking status: Never Smoker   . Smokeless tobacco: Never Used  . Alcohol Use: No  . Drug Use: No  . Sexual Activity: No     Comment: lives with wife, no dietary restrictions,  no fish   Other Topics Concern  . Not on file   Social History Narrative    Outpatient Prescriptions Prior to Visit  Medication Sig Dispense Refill  . aspirin 81 MG tablet Take 81 mg by mouth 2 (two) times daily.    Marland Kitchen MAGNESIUM PO Take by mouth daily.    .  Misc Natural Products (OSTEO BI-FLEX ADV JOINT SHIELD PO) Take by mouth daily.    . Multiple Vitamins-Minerals (CENTRUM SILVER ADULT 50+ PO) Take by mouth daily.    . Probiotic Product (PROBIOTIC DAILY) CAPS Hardin Negus Colon Health-Take 1 capsule by mouth daily.    Marland Kitchen amLODipine (NORVASC) 5 MG tablet TAKE 1 TABLET BY MOUTH EVERY DAY 30 tablet 5  . lisinopril (PRINIVIL,ZESTRIL) 40 MG tablet Take 1 tablet (40 mg total) by mouth daily. 90 tablet 1   No facility-administered medications prior to visit.    Allergies  Allergen Reactions  . Fish Allergy Itching  . Propulsid [Cisapride] Hives    Review of Systems  Constitutional: Negative for fever and malaise/fatigue.  HENT: Negative for congestion.   Eyes: Negative for blurred vision.  Respiratory: Negative for shortness of breath.   Cardiovascular: Negative for chest pain, palpitations and leg swelling.  Gastrointestinal: Negative for nausea, abdominal pain and blood in stool.  Genitourinary: Negative for dysuria and frequency.  Musculoskeletal: Negative for falls.  Skin: Positive for itching and rash.  Neurological: Negative for dizziness, loss of consciousness and headaches.  Endo/Heme/Allergies: Negative for environmental allergies.  Psychiatric/Behavioral: Negative for depression. The patient is not nervous/anxious.        Objective:    Physical Exam  Constitutional:  He is oriented to person, place, and time. He appears well-developed and well-nourished. No distress.  HENT:  Head: Normocephalic and atraumatic.  Nose: Nose normal.  Eyes: Right eye exhibits no discharge. Left eye exhibits no discharge.  Neck: Normal range of motion. Neck supple.  Cardiovascular: Normal rate and regular rhythm.   No murmur heard. Pulmonary/Chest: Effort normal and breath sounds normal.  Abdominal: Soft. Bowel sounds are normal. There is no tenderness.  Musculoskeletal: He exhibits no edema.  Neurological: He is alert and oriented to person,  place, and time.  Skin: Skin is warm and dry. Rash noted.  Raised bumps on right arm, bump on upper arm with tick in place, mild erythema surrounding.   Psychiatric: He has a normal mood and affect.  Nursing note and vitals reviewed.   BP 124/80 mmHg  Pulse 66  Temp(Src) 98.1 F (36.7 C) (Oral)  Ht _0  (1.93 m)  Wt 229 lb (103.874 kg)  BMI 27.89 kg/m2  SpO2 97% Wt Readings from Last 3 Encounters:  11/19/15 229 lb (103.874 kg)  08/10/15 234 lb 6.4 oz (106.323 kg)  05/20/15 233 lb (105.688 kg)     Lab Results  Component Value Date   WBC 6.4 05/20/2015   HGB 13.6 05/20/2015   HCT 42.3 05/20/2015   PLT 233.0 05/20/2015   GLUCOSE 119* 11/19/2015   CHOL 175 05/20/2015   TRIG 69.0 05/20/2015   HDL 52.80 05/20/2015   LDLCALC 108* 05/20/2015   ALT 15 11/19/2015   AST 19 11/19/2015   NA 141 11/19/2015   K 4.3 11/19/2015   CL 109 11/19/2015   CREATININE 1.32 11/19/2015   BUN 18 11/19/2015   CO2 25 11/19/2015   TSH 1.15 05/20/2015   PSA 0.47 04/14/2014    Lab Results  Component Value Date   TSH 1.15 05/20/2015   Lab Results  Component Value Date   WBC 6.4 05/20/2015   HGB 13.6 05/20/2015   HCT 42.3 05/20/2015   MCV 96.6 05/20/2015   PLT 233.0 05/20/2015   Lab Results  Component Value Date   NA 141 11/19/2015   K 4.3 11/19/2015   CO2 25 11/19/2015   GLUCOSE 119* 11/19/2015   BUN 18 11/19/2015   CREATININE 1.32 11/19/2015   BILITOT 0.5 11/19/2015   ALKPHOS 72 11/19/2015   AST 19 11/19/2015   ALT 15 11/19/2015   PROT 7.0 11/19/2015   ALBUMIN 4.0 11/19/2015   CALCIUM 8.7 11/19/2015   GFR 56.37* 11/19/2015   Lab Results  Component Value Date   CHOL 175 05/20/2015   Lab Results  Component Value Date   HDL 52.80 05/20/2015   Lab Results  Component Value Date   LDLCALC 108* 05/20/2015   Lab Results  Component Value Date   TRIG 69.0 05/20/2015   Lab Results  Component Value Date   CHOLHDL 3 05/20/2015   No results found for: HGBA1C       Assessment & Plan:   Problem List Items Addressed This Visit    Tick bite    Present on right arm has been present for roughly 4 days. Removed, check Lyme titer and RMSF      Relevant Orders   Rocky mtn spotted fvr ab, IgG-blood   B. Burgdorfi Antibodies   Rash and nonspecific skin eruption   Relevant Orders   Rocky mtn spotted fvr ab, IgG-blood   B. Burgdorfi Antibodies   Obesity    Encouraged DASH diet, decrease po intake and increase exercise  as tolerated. Needs 7-8 hours of sleep nightly. Avoid trans fats, eat small, frequent meals every 4-5 hours with lean proteins, complex carbs and healthy fats. Minimize simple carbs, GMO foods.      Hyperlipidemia, mixed    Encouraged heart healthy diet, increase exercise, avoid trans fats, consider a krill oil cap daily      Relevant Medications   amLODipine (NORVASC) 5 MG tablet   losartan (COZAAR) 50 MG tablet   Essential hypertension - Primary    Well controlled. Due to cough will try switching from Lisinopril to Losartan. Encouraged heart healthy diet such as the DASH diet and exercise as tolerated.       Relevant Medications   amLODipine (NORVASC) 5 MG tablet   losartan (COZAAR) 50 MG tablet   Other Relevant Orders   Comp Met (CMET) (Completed)   Comp Met (CMET)      I have discontinued Mr. Schaben's lisinopril and lisinopril. I have also changed his amLODipine. Additionally, I am having him start on losartan. Lastly, I am having him maintain his Multiple Vitamins-Minerals (CENTRUM SILVER ADULT 50+ PO), Misc Natural Products (OSTEO BI-FLEX ADV JOINT SHIELD PO), MAGNESIUM PO, aspirin, and PROBIOTIC DAILY.  Meds ordered this encounter  Medications  . DISCONTD: lisinopril (PRINIVIL,ZESTRIL) 40 MG tablet    Sig: Take 1 tablet (40 mg total) by mouth daily.    Dispense:  90 tablet    Refill:  1  . amLODipine (NORVASC) 5 MG tablet    Sig: Take 1 tablet (5 mg total) by mouth daily.    Dispense:  90 tablet    Refill:  1  .  losartan (COZAAR) 50 MG tablet    Sig: Take 1 tablet (50 mg total) by mouth daily.    Dispense:  90 tablet    Refill:  1     Penni Homans, MD

## 2015-11-19 NOTE — Assessment & Plan Note (Signed)
Encouraged DASH diet, decrease po intake and increase exercise as tolerated. Needs 7-8 hours of sleep nightly. Avoid trans fats, eat small, frequent meals every 4-5 hours with lean proteins, complex carbs and healthy fats. Minimize simple carbs, GMO foods. 

## 2015-11-19 NOTE — Patient Instructions (Signed)
Tick Bite Information Ticks are insects that attach themselves to the skin and draw blood for food. There are various types of ticks. Common types include wood ticks and deer ticks. Most ticks live in shrubs and grassy areas. Ticks can climb onto your body when you make contact with leaves or grass where the tick is waiting. The most common places on the body for ticks to attach themselves are the scalp, neck, armpits, waist, and groin. Most tick bites are harmless, but sometimes ticks carry germs that cause diseases. These germs can be spread to a person during the tick's feeding process. The chance of a disease spreading through a tick bite depends on:   The type of tick.  Time of year.   How long the tick is attached.   Geographic location.  HOW CAN YOU PREVENT TICK BITES? Take these steps to help prevent tick bites when you are outdoors:  Wear protective clothing. Long sleeves and long pants are best.   Wear white clothes so you can see ticks more easily.  Tuck your pant legs into your socks.   If walking on a trail, stay in the middle of the trail to avoid brushing against bushes.  Avoid walking through areas with long grass.  Put insect repellent on all exposed skin and along boot tops, pant legs, and sleeve cuffs.   Check clothing, hair, and skin repeatedly and before going inside.   Brush off any ticks that are not attached.  Take a shower or bath as soon as possible after being outdoors.  WHAT IS THE PROPER WAY TO REMOVE A TICK? Ticks should be removed as soon as possible to help prevent diseases caused by tick bites. 1. If latex gloves are available, put them on before trying to remove a tick.  2. Using fine-point tweezers, grasp the tick as close to the skin as possible. You may also use curved forceps or a tick removal tool. Grasp the tick as close to its head as possible. Avoid grasping the tick on its body. 3. Pull gently with steady upward pressure until  the tick lets go. Do not twist the tick or jerk it suddenly. This may break off the tick's head or mouth parts. 4. Do not squeeze or crush the tick's body. This could force disease-carrying fluids from the tick into your body.  5. After the tick is removed, wash the bite area and your hands with soap and water or other disinfectant such as alcohol. 6. Apply a small amount of antiseptic cream or ointment to the bite site.  7. Wash and disinfect any instruments that were used.  Do not try to remove a tick by applying a hot match, petroleum jelly, or fingernail polish to the tick. These methods do not work and may increase the chances of disease being spread from the tick bite.  WHEN SHOULD YOU SEEK MEDICAL CARE? Contact your health care provider if you are unable to remove a tick from your skin or if a part of the tick breaks off and is stuck in the skin.  After a tick bite, you need to be aware of signs and symptoms that could be related to diseases spread by ticks. Contact your health care provider if you develop any of the following in the days or weeks after the tick bite:  Unexplained fever.  Rash. A circular rash that appears days or weeks after the tick bite may indicate the possibility of Lyme disease. The rash may resemble   a target with a bull's-eye and may occur at a different part of your body than the tick bite.  Redness and swelling in the area of the tick bite.   Tender, swollen lymph glands.   Diarrhea.   Weight loss.   Cough.   Fatigue.   Muscle, joint, or bone pain.   Abdominal pain.   Headache.   Lethargy or a change in your level of consciousness.  Difficulty walking or moving your legs.   Numbness in the legs.   Paralysis.  Shortness of breath.   Confusion.   Repeated vomiting.    This information is not intended to replace advice given to you by your health care provider. Make sure you discuss any questions you have with your health  care provider.   Document Released: 06/17/2000 Document Revised: 07/11/2014 Document Reviewed: 11/28/2012 Elsevier Interactive Patient Education 2016 Elsevier Inc.  

## 2015-11-19 NOTE — Assessment & Plan Note (Signed)
Encouraged heart healthy diet, increase exercise, avoid trans fats, consider a krill oil cap daily 

## 2015-11-19 NOTE — Assessment & Plan Note (Signed)
Present on right arm has been present for roughly 4 days. Removed, check Lyme titer and RMSF

## 2015-11-19 NOTE — Assessment & Plan Note (Addendum)
Well controlled. Due to cough will try switching from Lisinopril to Losartan. Encouraged heart healthy diet such as the DASH diet and exercise as tolerated.

## 2015-11-19 NOTE — Progress Notes (Signed)
Pre visit review using our clinic review tool, if applicable. No additional management support is needed unless otherwise documented below in the visit note. 

## 2015-11-20 LAB — LYME AB/WESTERN BLOT REFLEX

## 2015-11-24 LAB — RICKETTSIA AB PNL W/RFX TO TITER
R. typhi IgG: NOT DETECTED
R. typhi IgM: NOT DETECTED
RMSF IGM: NOT DETECTED
RMSF IgG: NOT DETECTED

## 2016-05-12 ENCOUNTER — Other Ambulatory Visit: Payer: Self-pay | Admitting: Family Medicine

## 2016-05-12 DIAGNOSIS — I10 Essential (primary) hypertension: Secondary | ICD-10-CM

## 2016-05-23 ENCOUNTER — Encounter: Payer: Self-pay | Admitting: Family Medicine

## 2016-05-23 ENCOUNTER — Telehealth: Payer: Self-pay

## 2016-05-23 ENCOUNTER — Ambulatory Visit (INDEPENDENT_AMBULATORY_CARE_PROVIDER_SITE_OTHER): Payer: Medicare Other | Admitting: Family Medicine

## 2016-05-23 VITALS — BP 118/70 | HR 55 | Temp 97.8°F | Wt 233.6 lb

## 2016-05-23 DIAGNOSIS — W57XXXD Bitten or stung by nonvenomous insect and other nonvenomous arthropods, subsequent encounter: Secondary | ICD-10-CM | POA: Diagnosis not present

## 2016-05-23 DIAGNOSIS — M109 Gout, unspecified: Secondary | ICD-10-CM

## 2016-05-23 DIAGNOSIS — I10 Essential (primary) hypertension: Secondary | ICD-10-CM

## 2016-05-23 DIAGNOSIS — R739 Hyperglycemia, unspecified: Secondary | ICD-10-CM

## 2016-05-23 DIAGNOSIS — E6609 Other obesity due to excess calories: Secondary | ICD-10-CM

## 2016-05-23 DIAGNOSIS — R7309 Other abnormal glucose: Secondary | ICD-10-CM

## 2016-05-23 DIAGNOSIS — Z23 Encounter for immunization: Secondary | ICD-10-CM | POA: Diagnosis not present

## 2016-05-23 DIAGNOSIS — E782 Mixed hyperlipidemia: Secondary | ICD-10-CM

## 2016-05-23 DIAGNOSIS — K219 Gastro-esophageal reflux disease without esophagitis: Secondary | ICD-10-CM

## 2016-05-23 LAB — HEMOGLOBIN A1C: HEMOGLOBIN A1C: 5.5 % (ref 4.6–6.5)

## 2016-05-23 LAB — COMPREHENSIVE METABOLIC PANEL
ALBUMIN: 4 g/dL (ref 3.5–5.2)
ALK PHOS: 82 U/L (ref 39–117)
ALT: 18 U/L (ref 0–53)
AST: 21 U/L (ref 0–37)
BILIRUBIN TOTAL: 0.9 mg/dL (ref 0.2–1.2)
BUN: 13 mg/dL (ref 6–23)
CO2: 25 mEq/L (ref 19–32)
Calcium: 8.7 mg/dL (ref 8.4–10.5)
Chloride: 107 mEq/L (ref 96–112)
Creatinine, Ser: 1.17 mg/dL (ref 0.40–1.50)
GFR: 64.7 mL/min (ref >60.00)
GLUCOSE: 88 mg/dL (ref 70–99)
Potassium: 3.8 mEq/L (ref 3.5–5.1)
Sodium: 140 mEq/L (ref 135–145)
TOTAL PROTEIN: 7.1 g/dL (ref 6.0–8.3)

## 2016-05-23 LAB — TSH: TSH: 1.09 u[IU]/mL (ref 0.35–4.50)

## 2016-05-23 LAB — CBC
HEMATOCRIT: 42.1 % (ref 39.0–52.0)
HEMOGLOBIN: 13.8 g/dL (ref 13.0–17.0)
MCHC: 32.9 g/dL (ref 30.0–36.0)
MCV: 94.5 fl (ref 78.0–100.0)
Platelets: 241 10*3/uL (ref 150.0–400.0)
RBC: 4.45 Mil/uL (ref 4.22–5.81)
RDW: 13.5 % (ref 11.5–15.5)
WBC: 5.6 10*3/uL (ref 4.0–10.5)

## 2016-05-23 LAB — URIC ACID: Uric Acid, Serum: 6.7 mg/dL (ref 4.0–7.8)

## 2016-05-23 LAB — LIPID PANEL
CHOL/HDL RATIO: 3
Cholesterol: 167 mg/dL (ref 0–200)
HDL: 50.7 mg/dL (ref >39.00)
LDL Cholesterol: 101 mg/dL — ABNORMAL HIGH (ref 0–99)
NONHDL: 116.32
TRIGLYCERIDES: 76 mg/dL (ref 0.0–149.0)
VLDL: 15.2 mg/dL (ref 0.0–40.0)

## 2016-05-23 NOTE — Assessment & Plan Note (Signed)
Well controlled, no changes to meds. Encouraged heart healthy diet such as the DASH diet and exercise as tolerated.  °

## 2016-05-23 NOTE — Progress Notes (Signed)
Pre visit review using our clinic review tool, if applicable. No additional management support is needed unless otherwise documented below in the visit note.   Subjective:    Patient ID: Anthony Poole, male    DOB: 1940-11-06, 75 y.o.   MRN: [id number]  No chief complaint on file.   HPI Patient is in today for a medicare wellness visit pt c/o of bilateral toe burning sensation left> right. Mild swelling also noted. No trauma or other changes. No polyuria or polydipsia. Also notes some mild itching near an old tick bite site. No systemic symptoms of tick borne illness such as headache, fevers, target lesion. Denies CP/palp/SOB/HA/congestion/fevers/GI or GU c/o. Taking meds as prescribed  Past Medical History:  Diagnosis Date  . Anemia 07/29/2012  . 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 GERD (gastroesophageal reflux disease)   . Headache 04/20/2014  . Hypertension   . Medicare annual wellness visit, subsequent 04/14/2014  . Obesity 10/20/2014  . Other and unspecified hyperlipidemia 04/14/2013  . Preventative health care 07/29/2012  . Shingles 12/12/2012  . Sinusitis, acute 09/05/2012  . Tachycardia 10/14/2014  . Tick bite 11/19/2015    Past Surgical History:  Procedure Laterality Date  . CHOLECYSTECTOMY      Family History  Problem Relation Age of Onset  . COPD Mother     worked in [employer], clots  . Peripheral vascular disease Mother   . Dementia Father   . Peripheral vascular disease Brother     Social History   Social History  . Marital status: Married    Spouse name: N/A  . Number of children: N/A  . Years of education: N/A   Occupational History  . Not on file.   Social History Main Topics  . Smoking status: Never Smoker  . Smokeless tobacco: Never Used  . Alcohol use No  . Drug use: No  . Sexual activity: No     Comment: lives with wife, no dietary restrictions,  no fish   Other Topics Concern  . Not on file   Social History  Narrative  . No narrative on file    Outpatient Medications Prior to Visit  Medication Sig Dispense Refill  . amLODipine (NORVASC) 5 MG tablet Take 1 tablet (5 mg total) by mouth daily. 90 tablet 1  . aspirin 81 MG tablet Take 81 mg by mouth 2 (two) times daily.    Marland Kitchen. losartan (COZAAR) 50 MG tablet TAKE 1 TABLET (50 MG TOTAL) BY MOUTH DAILY. 90 tablet 1  . MAGNESIUM PO Take by mouth daily.    . Misc Natural Products (OSTEO BI-FLEX ADV JOINT SHIELD PO) Take by mouth daily.    . Multiple Vitamins-Minerals (CENTRUM SILVER ADULT 50+ PO) Take by mouth daily.    . Probiotic Product (PROBIOTIC DAILY) CAPS Vear [employer] Colon Health-Take 1 capsule by mouth daily.     No facility-administered medications prior to visit.     Allergies  Allergen Reactions  . Fish Allergy Itching  . Propulsid [Cisapride] Hives    Review of Systems  Constitutional: Negative for fever.  Eyes: Negative for blurred vision.  Respiratory: Negative for cough and shortness of breath.   Cardiovascular: Negative for chest pain and palpitations.  Gastrointestinal: Negative for vomiting.  Musculoskeletal: Positive for joint pain. Negative for back pain.  Skin: Negative for rash.  Neurological: Positive for tingling and sensory change. Negative for loss of consciousness and headaches.       Objective:  Physical Exam  Constitutional: He is oriented to person, place, and time. He appears well-developed and well-nourished. No distress.  HENT:  Head: Normocephalic and atraumatic.  Eyes: Conjunctivae are normal.  Neck: Normal range of motion. No thyromegaly present.  Cardiovascular: Normal rate and regular rhythm.   Pulmonary/Chest: Effort normal and breath sounds normal. He has no wheezes.  Abdominal: Soft. Bowel sounds are normal. He exhibits no mass. There is no tenderness. There is no rebound and no guarding.  Musculoskeletal: Normal range of motion. He exhibits no edema.  Neurological: He is alert and oriented to  person, place, and time.  Skin: Skin is warm and dry. He is not diaphoretic.  Psychiatric: He has a normal mood and affect.    There were no vitals taken for this visit. Wt Readings from Last 3 Encounters:  11/19/15 229 lb (103.9 kg)  08/10/15 234 lb 6.4 oz (106.3 kg)  05/20/15 233 lb (105.7 kg)     Lab Results  Component Value Date   WBC 6.4 05/20/2015   HGB 13.6 05/20/2015   HCT 42.3 05/20/2015   PLT 233.0 05/20/2015   GLUCOSE 119 (H) 11/19/2015   CHOL 175 05/20/2015   TRIG 69.0 05/20/2015   HDL 52.80 05/20/2015   LDLCALC 108 (H) 05/20/2015   ALT 15 11/19/2015   AST 19 11/19/2015   NA 141 11/19/2015   K 4.3 11/19/2015   CL 109 11/19/2015   CREATININE 1.32 11/19/2015   BUN 18 11/19/2015   CO2 25 11/19/2015   TSH 1.15 05/20/2015   PSA 0.47 04/14/2014    Lab Results  Component Value Date   TSH 1.15 05/20/2015   Lab Results  Component Value Date   WBC 6.4 05/20/2015   HGB 13.6 05/20/2015   HCT 42.3 05/20/2015   MCV 96.6 05/20/2015   PLT 233.0 05/20/2015   Lab Results  Component Value Date   NA 141 11/19/2015   K 4.3 11/19/2015   CO2 25 11/19/2015   GLUCOSE 119 (H) 11/19/2015   BUN 18 11/19/2015   CREATININE 1.32 11/19/2015   BILITOT 0.5 11/19/2015   ALKPHOS 72 11/19/2015   AST 19 11/19/2015   ALT 15 11/19/2015   PROT 7.0 11/19/2015   ALBUMIN 4.0 11/19/2015   CALCIUM 8.7 11/19/2015   GFR 56.37 (L) 11/19/2015   Lab Results  Component Value Date   CHOL 175 05/20/2015   Lab Results  Component Value Date   HDL 52.80 05/20/2015   Lab Results  Component Value Date   LDLCALC 108 (H) 05/20/2015   Lab Results  Component Value Date   TRIG 69.0 05/20/2015   Lab Results  Component Value Date   CHOLHDL 3 05/20/2015   No results found for: HGBA1C     Assessment & Plan:   Problem List Items Addressed This Visit    None      I am having Mr. Rohrer maintain his Multiple Vitamins-Minerals (CENTRUM SILVER ADULT 50+ PO), Misc Natural Products  (OSTEO BI-FLEX ADV JOINT SHIELD PO), MAGNESIUM PO, aspirin, PROBIOTIC DAILY, amLODipine, and losartan.  No orders of the defined types were placed in this encounter.    Crissie SicklesPrincess Carter, [place]MA

## 2016-05-23 NOTE — Assessment & Plan Note (Signed)
Encouraged heart healthy diet, increase exercise, avoid trans fats, consider a krill oil cap daily 

## 2016-05-23 NOTE — Patient Instructions (Signed)
Vitamin D 2000 IU daily [facility] can do a hearing screen   Preventive Care 28 Years and Older, Male Preventive care refers to lifestyle choices and visits with your health care provider that can promote health and wellness. What does preventive care include?  A yearly physical exam. This is also called an annual well check.  Dental exams once or twice a year.  Routine eye exams. Ask your health care provider how often you should have your eyes checked.  Personal lifestyle choices, including:  Daily care of your teeth and gums.  Regular physical activity.  Eating a healthy diet.  Avoiding tobacco and drug use.  Limiting alcohol use.  Practicing safe sex.  Taking low doses of aspirin every day.  Taking vitamin and mineral supplements as recommended by your health care provider. What happens during an annual well check? The services and screenings done by your health care provider during your annual well check will depend on your age, overall health, lifestyle risk factors, and family history of disease. Counseling  Your health care provider may ask you questions about your:  Alcohol use.  Tobacco use.  Drug use.  Emotional well-being.  Home and relationship well-being.  Sexual activity.  Eating habits.  History of falls.  Memory and ability to understand (cognition).  Work and work Statistician. Screening  You may have the following tests or measurements:  Height, weight, and BMI.  Blood pressure.  Lipid and cholesterol levels. These may be checked every 5 years, or more frequently if you are over 29 years old.  Skin check.  Lung cancer screening. You may have this screening every year starting at age 49 if you have a 30-pack-year history of smoking and currently smoke or have quit within the past 15 years.  Fecal occult blood test (FOBT) of the stool. You may have this test every year starting at age 9.  Flexible sigmoidoscopy or colonoscopy. You may  have a sigmoidoscopy every 5 years or a colonoscopy every 10 years starting at age 98.  Prostate cancer screening. Recommendations will vary depending on your family history and other risks.  Hepatitis C blood test.  Hepatitis B blood test.  Sexually transmitted disease (STD) testing.  Diabetes screening. This is done by checking your blood sugar (glucose) after you have not eaten for a while (fasting). You may have this done every 1-3 years.  Abdominal aortic aneurysm (AAA) screening. You may need this if you are a current or former smoker.  Osteoporosis. You may be screened starting at age 54 if you are at high risk. Talk with your health care provider about your test results, treatment options, and if necessary, the need for more tests. Vaccines  Your health care provider may recommend certain vaccines, such as:  Influenza vaccine. This is recommended every year.  Tetanus, diphtheria, and acellular pertussis (Tdap, Td) vaccine. You may need a Td booster every 10 years.  Varicella vaccine. You may need this if you have not been vaccinated.  Zoster vaccine. You may need this after age 22.  Measles, mumps, and rubella (MMR) vaccine. You may need at least one dose of MMR if you were born in 1957 or later. You may also need a second dose.  Pneumococcal 13-valent conjugate (PCV13) vaccine. One dose is recommended after age 65.  Pneumococcal polysaccharide (PPSV23) vaccine. One dose is recommended after age 30.  Meningococcal vaccine. You may need this if you have certain conditions.  Hepatitis A vaccine. You may need this if  you have certain conditions or if you travel or work in places where you may be exposed to hepatitis A.  Hepatitis B vaccine. You may need this if you have certain conditions or if you travel or work in places where you may be exposed to hepatitis B.  Haemophilus influenzae type b (Hib) vaccine. You may need this if you have certain risk factors. Talk to your  health care provider about which screenings and vaccines you need and how often you need them. This information is not intended to replace advice given to you by your health care provider. Make sure you discuss any questions you have with your health care provider. Document Released: 07/17/2015 Document Revised: 03/09/2016 Document Reviewed: 04/21/2015 Elsevier Interactive Patient Education  2017 [employer].

## 2016-05-24 NOTE — Telephone Encounter (Signed)
Error

## 2016-06-01 DIAGNOSIS — Z1211 Encounter for screening for malignant neoplasm of colon: Secondary | ICD-10-CM | POA: Diagnosis not present

## 2016-06-01 DIAGNOSIS — Z1212 Encounter for screening for malignant neoplasm of rectum: Secondary | ICD-10-CM | POA: Diagnosis not present

## 2016-06-03 LAB — COLOGUARD: COLOGUARD: NEGATIVE

## 2016-06-05 DIAGNOSIS — R739 Hyperglycemia, unspecified: Secondary | ICD-10-CM | POA: Insufficient documentation

## 2016-06-05 DIAGNOSIS — M109 Gout, unspecified: Secondary | ICD-10-CM

## 2016-06-05 HISTORY — DX: Gout, unspecified: M10.9

## 2016-06-05 HISTORY — DX: Hyperglycemia, unspecified: R73.9

## 2016-06-05 NOTE — Assessment & Plan Note (Signed)
hgba1c acceptable, minimize simple carbs. Increase exercise as tolerated.  

## 2016-06-05 NOTE — Assessment & Plan Note (Signed)
Encouraged DASH diet, decrease po intake and increase exercise as tolerated. Needs 7-8 hours of sleep nightly. Avoid trans fats, eat small, frequent meals every 4-5 hours with lean proteins, complex carbs and healthy fats. Minimize simple carbs, 

## 2016-06-05 NOTE — Assessment & Plan Note (Signed)
Uric acid upper end of normal, hydrate well and avoid offending foods

## 2016-06-05 NOTE — Assessment & Plan Note (Signed)
Avoid offending foods, start probiotics. Do not eat large meals in late evening and consider raising head of bed.  

## 2016-06-10 ENCOUNTER — Other Ambulatory Visit: Payer: Self-pay

## 2016-06-13 ENCOUNTER — Encounter: Payer: Self-pay | Admitting: Family Medicine

## 2016-06-13 ENCOUNTER — Telehealth: Payer: Self-pay | Admitting: Family Medicine

## 2016-06-13 NOTE — Telephone Encounter (Signed)
Received Cologuard results--NEGATIVE Abstracted into chart. Copied/scanned/mailed result to the patient.

## 2016-06-16 ENCOUNTER — Other Ambulatory Visit: Payer: Self-pay | Admitting: Family Medicine

## 2016-06-16 ENCOUNTER — Telehealth: Payer: Self-pay | Admitting: *Deleted

## 2016-06-16 NOTE — Telephone Encounter (Signed)
Patient called back and declined AWV  

## 2016-06-16 NOTE — Telephone Encounter (Signed)
Called patient and left message to return call to schedule AWV w/ [facility]. 

## 2016-08-02 DIAGNOSIS — H2513 Age-related nuclear cataract, bilateral: Secondary | ICD-10-CM | POA: Diagnosis not present

## 2016-08-02 DIAGNOSIS — H524 Presbyopia: Secondary | ICD-10-CM | POA: Diagnosis not present

## 2016-09-26 ENCOUNTER — Ambulatory Visit (INDEPENDENT_AMBULATORY_CARE_PROVIDER_SITE_OTHER): Payer: Medicare Other | Admitting: Family Medicine

## 2016-09-26 DIAGNOSIS — T7840XD Allergy, unspecified, subsequent encounter: Secondary | ICD-10-CM

## 2016-09-26 DIAGNOSIS — K219 Gastro-esophageal reflux disease without esophagitis: Secondary | ICD-10-CM | POA: Diagnosis not present

## 2016-09-26 DIAGNOSIS — M549 Dorsalgia, unspecified: Secondary | ICD-10-CM

## 2016-09-26 DIAGNOSIS — I10 Essential (primary) hypertension: Secondary | ICD-10-CM

## 2016-09-26 DIAGNOSIS — E6609 Other obesity due to excess calories: Secondary | ICD-10-CM

## 2016-09-26 DIAGNOSIS — Z23 Encounter for immunization: Secondary | ICD-10-CM | POA: Diagnosis not present

## 2016-09-26 DIAGNOSIS — R040 Epistaxis: Secondary | ICD-10-CM | POA: Diagnosis not present

## 2016-09-26 DIAGNOSIS — E782 Mixed hyperlipidemia: Secondary | ICD-10-CM | POA: Diagnosis not present

## 2016-09-26 DIAGNOSIS — R739 Hyperglycemia, unspecified: Secondary | ICD-10-CM

## 2016-09-26 MED ORDER — MUPIROCIN 2 % EX OINT: 1.0000 "application " | TOPICAL_OINTMENT | Freq: Every day | CUTANEOUS | 1 refills | Status: DC

## 2016-09-26 NOTE — Assessment & Plan Note (Signed)
Well controlled, no changes to meds. Encouraged heart healthy diet such as the DASH diet and exercise as tolerated.  °

## 2016-09-26 NOTE — Assessment & Plan Note (Signed)
hgba1c acceptable, minimize simple carbs. Increase exercise as tolerated.  

## 2016-09-26 NOTE — Assessment & Plan Note (Signed)
Encouraged DASH diet, decrease po intake and increase exercise as tolerated. Needs 7-8 hours of sleep nightly. Avoid trans fats, eat small, frequent meals every 4-5 hours with lean proteins, complex carbs and healthy fats. Minimize simple carbs, consider bariatrics 

## 2016-09-26 NOTE — Progress Notes (Signed)
Pre visit review using our clinic review tool, if applicable. No additional management support is needed unless otherwise documented below in the visit note. 

## 2016-09-26 NOTE — Progress Notes (Signed)
Subjective:  I acted as a Neurosurgeon for Dr. Abner Greenspan. [place], [place]   Patient ID: Anthony Poole, male    DOB: 10-19-1940, 76 y.o.   MRN: [id number]  Chief Complaint  Patient presents with  . Follow-up  . Hypertension  . Hyperlipidemia    HPI  Patient is in today for a 4 month follow up. He feels well today. Does endorse some trouble with right sided back pain. Denies any trauma or injury. Is also noting worsening congestion qhs and believes allergies are flaring up.notes some mild epistaxis and a cough sometimes productive of clear mucus. No fevers, chills. Denies CP/palp/SOB/HA/fevers/GI or GU c/o. Taking meds as prescribed  Patient Care Team: Bradd Canary, MD as PCP - General (Family Medicine)   Past Medical History:  Diagnosis Date  . Anemia 07/29/2012  . 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 Epistaxis 09/30/2016  . GERD (gastroesophageal reflux disease)   . Headache 04/20/2014  . Hypertension   . Medicare annual wellness visit, subsequent 04/14/2014  . Obesity 10/20/2014  . Other and unspecified hyperlipidemia 04/14/2013  . Preventative health care 07/29/2012  . Shingles 12/12/2012  . Sinusitis, acute 09/05/2012  . Tachycardia 10/14/2014  . Tick bite 11/19/2015    Past Surgical History:  Procedure Laterality Date  . CHOLECYSTECTOMY      Family History  Problem Relation Age of Onset  . COPD Mother     worked in [employer], clots  . Peripheral vascular disease Mother   . Dementia Father   . Peripheral vascular disease Brother     Social History   Social History  . Marital status: Married    Spouse name: N/A  . Number of children: N/A  . Years of education: N/A   Occupational History  . Not on file.   Social History Main Topics  . Smoking status: Never Smoker  . Smokeless tobacco: Never Used  . Alcohol use No  . Drug use: No  . Sexual activity: No     Comment: lives with wife, no dietary restrictions,  no fish   Other Topics Concern    . Not on file   Social History Narrative  . No narrative on file    Outpatient Medications Prior to Visit  Medication Sig Dispense Refill  . amLODipine (NORVASC) 5 MG tablet TAKE 1 TABLET (5 MG TOTAL) BY MOUTH DAILY. 90 tablet 1  . aspirin 81 MG tablet Take 81 mg by mouth 2 (two) times daily.    Marland Kitchen losartan (COZAAR) 50 MG tablet TAKE 1 TABLET (50 MG TOTAL) BY MOUTH DAILY. 90 tablet 1  . MAGNESIUM PO Take by mouth daily.    . Misc Natural Products (OSTEO BI-FLEX ADV JOINT SHIELD PO) Take by mouth daily.    . Multiple Vitamins-Minerals (CENTRUM SILVER ADULT 50+ PO) Take by mouth daily.    . Probiotic Product (PROBIOTIC DAILY) CAPS Vear Clock Colon Health-Take 1 capsule by mouth daily.     No facility-administered medications prior to visit.     Allergies  Allergen Reactions  . Fish Allergy Itching  . Propulsid [Cisapride] Hives    Review of Systems  Constitutional: Negative for fever and malaise/fatigue.  HENT: Positive for congestion and nosebleeds.   Eyes: Negative for blurred vision.  Respiratory: Positive for cough and sputum production. Negative for shortness of breath.   Cardiovascular: Negative for chest pain, palpitations and leg swelling.  Gastrointestinal: Negative for vomiting.  Musculoskeletal: Negative for back pain.  Skin: Negative for rash.  Neurological: Negative for loss of consciousness and headaches.       Objective:    Physical Exam  Constitutional: He is oriented to person, place, and time. He appears well-developed and well-nourished. No distress.  HENT:  Head: Normocephalic and atraumatic.  Eyes: Conjunctivae are normal.  Neck: Normal range of motion. No thyromegaly present.  Cardiovascular: Normal rate and regular rhythm.   Pulmonary/Chest: Effort normal and breath sounds normal. He has no wheezes.  Abdominal: Soft. Bowel sounds are normal. There is no tenderness.  Musculoskeletal: Normal range of motion. He exhibits no edema or deformity.   Neurological: He is alert and oriented to person, place, and time.  Skin: Skin is warm and dry. He is not diaphoretic.  Psychiatric: He has a normal mood and affect.    BP 126/79 (BP Location: Left Arm, Patient Position: Sitting, Cuff Size: Normal)   Pulse 62   Temp 98.1 F (36.7 C) (Oral)   Resp 18   Wt 236 lb 12.8 oz (107.4 kg)   SpO2 100%   BMI 28.82 kg/m  Wt Readings from Last 3 Encounters:  09/26/16 236 lb 12.8 oz (107.4 kg)  05/23/16 233 lb 9.6 oz (106 kg)  11/19/15 229 lb (103.9 kg)   BP Readings from Last 3 Encounters:  09/26/16 126/79  05/23/16 118/70  11/19/15 124/80     Immunization History  Administered Date(s) Administered  . Influenza Split 04/02/2012  . Influenza, High Dose Seasonal PF 04/12/2013, 05/23/2016  . Influenza,inj,Quad PF,36+ Mos 04/14/2014, 05/20/2015  . Pneumococcal Conjugate-13 04/14/2014  . Pneumococcal Polysaccharide-23 09/26/2016  . Tdap 10/06/2011  . Zoster 10/06/2011    Health Maintenance  Topic Date Due  . Janet BerlinETANUS/TDAP  10/05/2021  . COLONOSCOPY  06/13/2026  . INFLUENZA VACCINE  Completed  . PNA vac Low Risk Adult  Completed    Lab Results  Component Value Date   WBC 5.6 05/23/2016   HGB 13.8 05/23/2016   HCT 42.1 05/23/2016   PLT 241.0 05/23/2016   GLUCOSE 88 05/23/2016   CHOL 167 05/23/2016   TRIG 76.0 05/23/2016   HDL 50.70 05/23/2016   LDLCALC 101 (H) 05/23/2016   ALT 18 05/23/2016   AST 21 05/23/2016   NA 140 05/23/2016   K 3.8 05/23/2016   CL 107 05/23/2016   CREATININE 1.17 05/23/2016   BUN 13 05/23/2016   CO2 25 05/23/2016   TSH 1.09 05/23/2016   PSA 0.47 04/14/2014   HGBA1C 5.5 05/23/2016    Lab Results  Component Value Date   TSH 1.09 05/23/2016   Lab Results  Component Value Date   WBC 5.6 05/23/2016   HGB 13.8 05/23/2016   HCT 42.1 05/23/2016   MCV 94.5 05/23/2016   PLT 241.0 05/23/2016   Lab Results  Component Value Date   NA 140 05/23/2016   K 3.8 05/23/2016   CO2 25 05/23/2016    GLUCOSE 88 05/23/2016   BUN 13 05/23/2016   CREATININE 1.17 05/23/2016   BILITOT 0.9 05/23/2016   ALKPHOS 82 05/23/2016   AST 21 05/23/2016   ALT 18 05/23/2016   PROT 7.1 05/23/2016   ALBUMIN 4.0 05/23/2016   CALCIUM 8.7 05/23/2016   GFR 64.70 05/23/2016   Lab Results  Component Value Date   CHOL 167 05/23/2016   Lab Results  Component Value Date   HDL 50.70 05/23/2016   Lab Results  Component Value Date   LDLCALC 101 (H) 05/23/2016   Lab Results  Component Value  Date   TRIG 76.0 05/23/2016   Lab Results  Component Value Date   CHOLHDL 3 05/23/2016   Lab Results  Component Value Date   HGBA1C 5.5 05/23/2016         Assessment & Plan:   Problem List Items Addressed This Visit    Essential hypertension    Well controlled, no changes to meds. Encouraged heart healthy diet such as the DASH diet and exercise as tolerated.       GERD (gastroesophageal reflux disease)    Avoid offending foods, take probiotics. Do not eat large meals in late evening and consider raising head of bed.       Back pain    Encouraged moist heat and gentle stretching as tolerated. May try NSAIDs and prescription meds as directed and report if symptoms worsen or seek immediate care. Try the salon pas, aspercreme, icy hot Lidocaine      Hyperlipidemia, mixed    Encouraged heart healthy diet, increase exercise, avoid trans fats, consider a krill oil cap daily      Allergic state    Continue nasal saline qhs and add prn and then can add Cetirizine and then Flonase as needed      Obesity    Encouraged DASH diet, decrease po intake and increase exercise as tolerated. Needs 7-8 hours of sleep nightly. Avoid trans fats, eat small, frequent meals every 4-5 hours with lean proteins, complex carbs and healthy fats. Minimize simple carbs, consider bariatrics       Hyperglycemia    hgba1c acceptable, minimize simple carbs. Increase exercise as tolerated.       Epistaxis    Mupirocin  ointment in nares qhs prn         I am having Mr. Lasser start on mupirocin ointment. I am also having him maintain his Multiple Vitamins-Minerals (CENTRUM SILVER ADULT 50+ PO), Misc Natural Products (OSTEO BI-FLEX ADV JOINT SHIELD PO), MAGNESIUM PO, aspirin, PROBIOTIC DAILY, losartan, and amLODipine.  Meds ordered this encounter  Medications  . mupirocin ointment (BACTROBAN) 2 %    Sig: Place 1 application into the nose at bedtime.    Dispense:  22 g    Refill:  1    CMA served as scribe during this visit. History, Physical and Plan performed by medical provider. Documentation and orders reviewed and attested to.  [name], MD

## 2016-09-26 NOTE — Assessment & Plan Note (Signed)
Continue nasal saline qhs and add prn and then can add Cetirizine and then Flonase as needed

## 2016-09-26 NOTE — Assessment & Plan Note (Signed)
Encouraged moist heat and gentle stretching as tolerated. May try NSAIDs and prescription meds as directed and report if symptoms worsen or seek immediate care. Try the salon pas, aspercreme, icy hot Lidocaine

## 2016-09-26 NOTE — Patient Instructions (Addendum)
Increase nasal saline to as needed. Add Cetirizine/Zyrtec 10 mg daily, Flonase/Flucticasone daily  Try 64 oz of clear fluids daily   Allergic Rhinitis Allergic rhinitis is when the mucous membranes in the nose respond to allergens. Allergens are particles in the air that cause your body to have an allergic reaction. This causes you to release allergic antibodies. Through a chain of events, these eventually cause you to release histamine into the blood stream. Although meant to protect the body, it is this release of histamine that causes your discomfort, such as frequent sneezing, congestion, and an itchy, runny nose. What are the causes? Seasonal allergic rhinitis (hay fever) is caused by pollen allergens that may come from grasses, trees, and weeds. Year-round allergic rhinitis (perennial allergic rhinitis) is caused by allergens such as house dust mites, pet dander, and mold spores. What are the signs or symptoms?  Nasal stuffiness (congestion).  Itchy, runny nose with sneezing and tearing of the eyes. How is this diagnosed? Your health care provider can help you determine the allergen or allergens that trigger your symptoms. If you and your health care provider are unable to determine the allergen, skin or blood testing may be used. Your health care provider will diagnose your condition after taking your health history and performing a physical exam. Your health care provider may assess you for other related conditions, such as asthma, pink eye, or an ear infection. How is this treated? Allergic rhinitis does not have a cure, but it can be controlled by:  Medicines that block allergy symptoms. These may include allergy shots, nasal sprays, and oral antihistamines.  Avoiding the allergen. Hay fever may often be treated with antihistamines in pill or nasal spray forms. Antihistamines block the effects of histamine. There are over-the-counter medicines that may help with nasal congestion and  swelling around the eyes. Check with your health care provider before taking or giving this medicine. If avoiding the allergen or the medicine prescribed do not work, there are many new medicines your health care provider can prescribe. Stronger medicine may be used if initial measures are ineffective. Desensitizing injections can be used if medicine and avoidance does not work. Desensitization is when a patient is given ongoing shots until the body becomes less sensitive to the allergen. Make sure you follow up with your health care provider if problems continue. Follow these instructions at home: It is not possible to completely avoid allergens, but you can reduce your symptoms by taking steps to limit your exposure to them. It helps to know exactly what you are allergic to so that you can avoid your specific triggers. Contact a health care provider if:  You have a fever.  You develop a cough that does not stop easily (persistent).  You have shortness of breath.  You start wheezing.  Symptoms interfere with normal daily activities. This information is not intended to replace advice given to you by your health care provider. Make sure you discuss any questions you have with your health care provider. Document Released: 03/15/2001 Document Revised: 02/19/2016 Document Reviewed: 02/25/2013 Elsevier Interactive Patient Education  2017 [employer]

## 2016-09-30 ENCOUNTER — Encounter: Payer: Self-pay | Admitting: Family Medicine

## 2016-09-30 DIAGNOSIS — R040 Epistaxis: Secondary | ICD-10-CM | POA: Insufficient documentation

## 2016-09-30 HISTORY — DX: Epistaxis: R04.0

## 2016-09-30 NOTE — Assessment & Plan Note (Signed)
Mupirocin ointment in nares qhs prn

## 2016-09-30 NOTE — Assessment & Plan Note (Signed)
Encouraged heart healthy diet, increase exercise, avoid trans fats, consider a krill oil cap daily 

## 2016-09-30 NOTE — Assessment & Plan Note (Addendum)
Avoid offending foods, take probiotics. Do not eat large meals in late evening and consider raising head of bed.  

## 2016-10-04 ENCOUNTER — Emergency Department (HOSPITAL_BASED_OUTPATIENT_CLINIC_OR_DEPARTMENT_OTHER)
Admission: EM | Admit: 2016-10-04 | Discharge: 2016-10-04 | Disposition: A | Discharge status: Home / Self Care | Payer: Medicare Other | Attending: Emergency Medicine | Admitting: Emergency Medicine

## 2016-10-04 ENCOUNTER — Encounter (HOSPITAL_BASED_OUTPATIENT_CLINIC_OR_DEPARTMENT_OTHER): Payer: Self-pay | Admitting: *Deleted

## 2016-10-04 DIAGNOSIS — Z79899 Other long term (current) drug therapy: Secondary | ICD-10-CM | POA: Insufficient documentation

## 2016-10-04 DIAGNOSIS — Z7982 Long term (current) use of aspirin: Secondary | ICD-10-CM | POA: Diagnosis not present

## 2016-10-04 DIAGNOSIS — I10 Essential (primary) hypertension: Secondary | ICD-10-CM | POA: Diagnosis not present

## 2016-10-04 DIAGNOSIS — M79671 Pain in right foot: Secondary | ICD-10-CM | POA: Diagnosis present

## 2016-10-04 DIAGNOSIS — L03115 Cellulitis of right lower limb: Secondary | ICD-10-CM | POA: Diagnosis not present

## 2016-10-04 MED ORDER — DOXYCYCLINE HYCLATE 100 MG PO CAPS: 100.0000 mg | ORAL_CAPSULE | Freq: Two times a day (BID) | ORAL | 0 refills | Status: DC

## 2016-10-04 MED FILL — DOXYCYCLINE HYC 100 MG CAP: 100 | 7 days supply | Qty: 14 | Fill #0

## 2016-10-04 NOTE — ED Triage Notes (Signed)
Dr Patria Mane at bedside for eval, pt reports swelling, redness and heat to his right foot. Denies any injury or trauma.

## 2016-10-05 NOTE — ED Provider Notes (Signed)
[facility]   CSN: [id number] Arrival date & time: 10/04/16  1031     History   Chief Complaint Chief Complaint  Patient presents with  . Foot Pain    HPI Anthony Poole is a 76 y.o. male.  HPI Patient presents to the emergency department with complaints of swelling of the dorsum of his right foot over the past 24 hours.  No injury or trauma.  No fevers or chills.  He reports increasing swelling and redness to the dorsum of the right foot without abrasions or lacerations.  His wife thought this may represent plantar fasciitis for which he disagreed and thus he came to the ER for evaluation.  He overall feels well.  No history DVT.  No proximal leg swelling.  He reports no history of gout.  Denies pain with range of motion of his right hip, right knee, right ankle.    Past Medical History:  Diagnosis Date  . Anemia 07/29/2012  . 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 Epistaxis 09/30/2016  . GERD (gastroesophageal reflux disease)   . Headache 04/20/2014  . Hypertension   . Medicare annual wellness visit, subsequent 04/14/2014  . Obesity 10/20/2014  . Other and unspecified hyperlipidemia 04/14/2013  . Preventative health care 07/29/2012  . Shingles 12/12/2012  . Sinusitis, acute 09/05/2012  . Tachycardia 10/14/2014  . Tick bite 11/19/2015    Patient Active Problem List   Diagnosis Date Noted  . Epistaxis 09/30/2016  . 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 Hyperglycemia 06/05/2016  . Rash and nonspecific skin eruption 11/19/2015  . Foot swelling 08/10/2015  . Obesity 10/20/2014  . Headache 04/20/2014  . Medicare annual wellness visit, subsequent 04/14/2014  . Otitis media 10/20/2013  . Allergic state 10/20/2013  . Hyperlipidemia, mixed 04/14/2013  . Shingles 12/12/2012  . Chronic knee pain 11/21/2012  . TIA (transient ischemic attack) 09/05/2012  . Preventative health care 07/29/2012  . Anemia 07/29/2012  . Back pain 04/29/2012  .  Essential hypertension 04/02/2012  . GERD (gastroesophageal reflux disease) 04/02/2012    Past Surgical History:  Procedure Laterality Date  . CHOLECYSTECTOMY         Home Medications    Prior to Admission medications   Medication Sig Start Date End Date Taking? Authorizing Provider  amLODipine (NORVASC) 5 MG tablet TAKE 1 TABLET (5 MG TOTAL) BY MOUTH DAILY. 06/16/16   Bradd Canary, MD  aspirin 81 MG tablet Take 81 mg by mouth 2 (two) times daily.    Historical Provider, MD  doxycycline (VIBRAMYCIN) 100 MG capsule Take 1 capsule (100 mg total) by mouth 2 (two) times daily. 10/04/16   Azalia Bilis, MD  losartan (COZAAR) 50 MG tablet TAKE 1 TABLET (50 MG TOTAL) BY MOUTH DAILY. 05/12/16   Bradd Canary, MD  MAGNESIUM PO Take by mouth daily.    Historical Provider, MD  Misc Natural Products (OSTEO BI-FLEX ADV JOINT SHIELD PO) Take by mouth daily.    Historical Provider, MD  Multiple Vitamins-Minerals (CENTRUM SILVER ADULT 50+ PO) Take by mouth daily.    Historical Provider, MD  mupirocin ointment (BACTROBAN) 2 % Place 1 application into the nose at bedtime. 09/26/16   Bradd Canary, MD  Probiotic Product (PROBIOTIC DAILY) CAPS Vear Clock Colon Health-Take 1 capsule by mouth daily.    Historical Provider, MD    Family History Family History  Problem Relation Age of Onset  . COPD Mother  worked in [employer], clots  . Peripheral vascular disease Mother   . Dementia Father   . Peripheral vascular disease Brother     Social History Social History  Substance Use Topics  . Smoking status: Never Smoker  . Smokeless tobacco: Never Used  . Alcohol use No     Allergies   Fish allergy and Propulsid [cisapride]   Review of Systems Review of Systems  All other systems reviewed and are negative.    Physical Exam Updated Vital Signs BP (!) 151/80 (BP Location: Left Arm)   Pulse 60   Temp 97.9 F (36.6 C) (Oral)   Resp 18   Ht  (1.93 m)   Wt 234 lb (106.1 kg)    SpO2 100%   BMI 28.48 kg/m   Physical Exam  Constitutional: He is oriented to person, place, and time. He appears well-developed and well-nourished.  HENT:  Head: Normocephalic.  Eyes: EOM are normal.  Neck: Normal range of motion.  Pulmonary/Chest: Effort normal.  Abdominal: He exhibits no distension.  Musculoskeletal: Normal range of motion.  Mild erythema and warmth as well as edema of the dorsum of his right foot.  Normal PT and DP pulse in the right foot.  Examination between all his toes demonstrate no cracks or lesions.  Bottom of the foot is normal.  No swelling of the proximal right leg.  Full range of motion of right hip, right knee, right ankle.  Neurological: He is alert and oriented to person, place, and time.  Psychiatric: He has a normal mood and affect.  Nursing note and vitals reviewed.    ED Treatments / Results  Labs (all labs ordered are listed, but only abnormal results are displayed) Labs Reviewed - No data to display  EKG  EKG Interpretation None       Radiology No results found.  Procedures Procedures (including critical care time)  Medications Ordered in ED Medications - No data to display   Initial Impression / Assessment and Plan / ED Course  I have reviewed the triage vital signs and the nursing notes.  Pertinent labs & imaging results that were available during my care of the patient were reviewed by me and considered in my medical decision making (see chart for details).     Doubt DVT.  Normal arterial flow.  No injury or trauma to suggest underlying osseous injury.  Suspect superficial cellulitis of his dorsum of his right foot.  This is very early.  Patient be placed on antibiotics.  He understands return to the ER for new or worsening symptoms.  I do not think he needs any additional testing at this time.  Patient be placed on doxycycline  Final Clinical Impressions(s) / ED Diagnoses   Final diagnoses:  Cellulitis of right foot     New Prescriptions Discharge Medication List as of 10/04/2016 11:11 AM    START taking these medications   Details  doxycycline (VIBRAMYCIN) 100 MG capsule Take 1 capsule (100 mg total) by mouth 2 (two) times daily., Starting Tue 10/04/2016, Print         Azalia Bilis, MD 10/05/16 [phone]

## 2016-10-10 ENCOUNTER — Encounter: Payer: Self-pay | Admitting: Medical

## 2016-10-10 ENCOUNTER — Ambulatory Visit (INDEPENDENT_AMBULATORY_CARE_PROVIDER_SITE_OTHER): Payer: Medicare Other | Admitting: Medical

## 2016-10-10 ENCOUNTER — Ambulatory Visit (HOSPITAL_BASED_OUTPATIENT_CLINIC_OR_DEPARTMENT_OTHER)
Admission: RE | Admit: 2016-10-10 | Discharge: 2016-10-10 | Disposition: A | Discharge status: Home / Self Care | Payer: Medicare Other | Source: Ambulatory Visit | Attending: Medical | Admitting: Medical

## 2016-10-10 VITALS — BP 123/74 | HR 66 | Temp 98.2°F | Ht 76.0 in | Wt 133.6 lb

## 2016-10-10 DIAGNOSIS — M7731 Calcaneal spur, right foot: Secondary | ICD-10-CM | POA: Diagnosis not present

## 2016-10-10 DIAGNOSIS — M2011 Hallux valgus (acquired), right foot: Secondary | ICD-10-CM | POA: Diagnosis not present

## 2016-10-10 DIAGNOSIS — M7989 Other specified soft tissue disorders: Secondary | ICD-10-CM | POA: Diagnosis not present

## 2016-10-10 DIAGNOSIS — M19071 Primary osteoarthritis, right ankle and foot: Secondary | ICD-10-CM | POA: Diagnosis not present

## 2016-10-10 DIAGNOSIS — L03115 Cellulitis of right lower limb: Secondary | ICD-10-CM | POA: Diagnosis not present

## 2016-10-10 DIAGNOSIS — M79671 Pain in right foot: Secondary | ICD-10-CM

## 2016-10-10 LAB — CBC WITH DIFFERENTIAL/PLATELET
BASOS ABS: 0.1 10*3/uL (ref 0.0–0.1)
Basophils Relative: 1 % (ref 0.0–3.0)
EOS ABS: 0.1 10*3/uL (ref 0.0–0.7)
EOS PCT: 1.1 % (ref 0.0–5.0)
HCT: 42.6 % (ref 39.0–52.0)
HEMOGLOBIN: 14.2 g/dL (ref 13.0–17.0)
LYMPHS ABS: 1.3 10*3/uL (ref 0.7–4.0)
Lymphocytes Relative: 19.8 % (ref 12.0–46.0)
MCHC: 33.4 g/dL (ref 30.0–36.0)
MCV: 95.5 fl (ref 78.0–100.0)
MONO ABS: 0.7 10*3/uL (ref 0.1–1.0)
Monocytes Relative: 10.3 % (ref 3.0–12.0)
NEUTROS PCT: 67.8 % (ref 43.0–77.0)
Neutro Abs: 4.3 10*3/uL (ref 1.4–7.7)
Platelets: 278 10*3/uL (ref 150.0–400.0)
RBC: 4.46 Mil/uL (ref 4.22–5.81)
RDW: 13.3 % (ref 11.5–15.5)
WBC: 6.4 10*3/uL (ref 4.0–10.5)

## 2016-10-10 LAB — URIC ACID: URIC ACID, SERUM: 7 mg/dL (ref 4.0–7.8)

## 2016-10-10 MED ORDER — CLINDAMYCIN HCL 150 MG PO CAPS: 150.0000 mg | ORAL_CAPSULE | Freq: Three times a day (TID) | ORAL | 0 refills | Status: DC

## 2016-10-10 NOTE — Progress Notes (Signed)
Pre visit review using our clinic tool,if applicable. No additional management support is needed unless otherwise documented below in the visit note.  

## 2016-10-10 NOTE — Patient Instructions (Addendum)
You appear to have probable residual infection. Will add 5 days of low dose clindamycin but use probiotic daily.(stop doxy today)  Will get cbc, uric acid, and get foot xray.  Area should gradually get better. If more red or any calf swelling let us know.  Follow up in 5-7 days or as needed

## 2016-10-10 NOTE — Progress Notes (Signed)
Subjective:    Patient ID: Anthony Poole, male    DOB: Nov 19, 1940, 76 y.o.   MRN: [id number]  HPI   Pt in for a follow up from ED.   Pt has some redness to his foot top aspect for one week around 10-04-2016. At onset he had some heel pain which has resolved. He went to the ED and was diagnosed with cellulits.   The area has improved but no completely. Most of redness decreased but residual/faint tenderness distal foot area.   No fever, no chills or sweats. No known trauma to foot. Pt does have history of occasional gout rt great toe area.      Review of Systems  Constitutional: Negative for chills, fatigue and fever.  Respiratory: Negative for cough, chest tightness, shortness of breath and wheezing.   Cardiovascular: Negative for chest pain and palpitations.  Gastrointestinal: Negative for abdominal pain.  Musculoskeletal: Negative for back pain, gait problem, myalgias and neck stiffness.  Skin: Negative for rash.       Faint redness.   Neurological: Negative for dizziness, tremors, speech difficulty, weakness, numbness and headaches.  Hematological: Negative for adenopathy. Does not bruise/bleed easily.    Past Medical History:  Diagnosis Date  . Anemia 07/29/2012  . 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 Epistaxis 09/30/2016  . GERD (gastroesophageal reflux disease)   . Headache 04/20/2014  . Hypertension   . Medicare annual wellness visit, subsequent 04/14/2014  . Obesity 10/20/2014  . Other and unspecified hyperlipidemia 04/14/2013  . Preventative health care 07/29/2012  . Shingles 12/12/2012  . Sinusitis, acute 09/05/2012  . Tachycardia 10/14/2014  . Tick bite 11/19/2015     Social History   Social History  . Marital status: Married    Spouse name: N/A  . Number of children: N/A  . Years of education: N/A   Occupational History  . Not on file.   Social History Main Topics  . Smoking status: Never Smoker  . Smokeless tobacco: Never Used    . Alcohol use No  . Drug use: No  . Sexual activity: No     Comment: lives with wife, no dietary restrictions,  no fish   Other Topics Concern  . Not on file   Social History Narrative  . No narrative on file    Past Surgical History:  Procedure Laterality Date  . CHOLECYSTECTOMY      Family History  Problem Relation Age of Onset  . COPD Mother     worked in [employer], clots  . Peripheral vascular disease Mother   . Dementia Father   . Peripheral vascular disease Brother     Allergies  Allergen Reactions  . Fish Allergy Itching  . Propulsid [Cisapride] Hives    Current Outpatient Prescriptions on File Prior to Visit  Medication Sig Dispense Refill  . amLODipine (NORVASC) 5 MG tablet TAKE 1 TABLET (5 MG TOTAL) BY MOUTH DAILY. 90 tablet 1  . aspirin 81 MG tablet Take 81 mg by mouth 2 (two) times daily.    Marland Kitchen doxycycline (VIBRAMYCIN) 100 MG capsule Take 1 capsule (100 mg total) by mouth 2 (two) times daily. 14 capsule 0  . losartan (COZAAR) 50 MG tablet TAKE 1 TABLET (50 MG TOTAL) BY MOUTH DAILY. 90 tablet 1  . MAGNESIUM PO Take by mouth daily.    . Misc Natural Products (OSTEO BI-FLEX ADV JOINT SHIELD PO) Take by mouth daily.    . Multiple  Vitamins-Minerals (CENTRUM SILVER ADULT 50+ PO) Take by mouth daily.    . mupirocin ointment (BACTROBAN) 2 % Place 1 application into the nose at bedtime. 22 g 1  . Probiotic Product (PROBIOTIC DAILY) CAPS Vear Clock Colon Health-Take 1 capsule by mouth daily.     No current facility-administered medications on file prior to visit.     BP 123/74   Pulse 66   Temp 98.2 F (36.8 C) (Oral)   Ht  (1.93 m)   Wt 133 lb 9.6 oz (60.6 kg)   SpO2 98%   BMI 16.26 kg/m       Objective:   Physical Exam  General- No acute distress. Pleasant patient.  Lungs- Clear, even and unlabored. Heart- regular rate and rhythm. Neurologic- CNII- XII grossly intact.  Rt foot- faint redness and faint warmth at distal end metataral head  region(small narrow strip of faint pink redness). Good pulses. Rt lower ext- no calf swelling. Negative homans signs.     Assessment & Plan:  You appear to have probable residual infection. Will add 5 days of low dose clindamycin but use probiotic daily.(stop doxy today)  Will get cbc, uric acid, and get foot xray.  Area should gradually get better. If more red or any calf swelling let us know.  Follow up in 5-7 days or as needed

## 2016-10-13 ENCOUNTER — Encounter: Payer: Self-pay | Admitting: Medical

## 2016-10-17 ENCOUNTER — Ambulatory Visit (INDEPENDENT_AMBULATORY_CARE_PROVIDER_SITE_OTHER): Payer: Medicare Other | Admitting: Medical

## 2016-10-17 VITALS — BP 121/69 | HR 60 | Temp 97.8°F | Resp 16 | Ht 76.0 in | Wt 233.0 lb

## 2016-10-17 DIAGNOSIS — M79671 Pain in right foot: Secondary | ICD-10-CM

## 2016-10-17 MED ORDER — PREDNISONE 20 MG PO TABS: ORAL_TABLET | ORAL | 0 refills | Status: DC

## 2016-10-17 MED FILL — predniSONE 20 MG TABS: 20 | 6 days supply | Qty: 12 | Fill #0

## 2016-10-17 NOTE — Progress Notes (Signed)
Subjective:    Patient ID: Anthony Poole, male    DOB: [birth year], 76 y.o.   MRN: [id number]  HPI  Pt in states he feels better with just one tablet of uric acid support. He states yesterday before taking the tablet his foot was a little more tender and swollen.   Pt in past got gout in his large great toe in the past. I had followed up with him from the ED last week and had considered residual infection vs gout. His uric acid was in the normal range for out lab. But not less than 6 and wife reports he had history of gout in the past.   I had rx'd clindamycin hoping to see full resolution after she took doxycycline.      Review of Systems  Constitutional: Negative for chills, fatigue and fever.  Respiratory: Negative for cough, chest tightness and wheezing.   Cardiovascular: Negative for chest pain.  Musculoskeletal:       Rt foot pain. Faint.  Skin:       See hpi and physical.  Neurological: Negative for dizziness and headaches.  Hematological: Negative for adenopathy. Does not bruise/bleed easily.  Psychiatric/Behavioral: Negative for behavioral problems and confusion.    Past Medical History:  Diagnosis Date  . Anemia 07/29/2012  . 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 Epistaxis 09/30/2016  . GERD (gastroesophageal reflux disease)   . Headache 04/20/2014  . Hypertension   . Medicare annual wellness visit, subsequent 04/14/2014  . Obesity 10/20/2014  . Other and unspecified hyperlipidemia 04/14/2013  . Preventative health care 07/29/2012  . Shingles 12/12/2012  . Sinusitis, acute 09/05/2012  . Tachycardia 10/14/2014  . Tick bite 11/19/2015     Social History   Social History  . Marital status: Married    Spouse name: N/A  . Number of children: N/A  . Years of education: N/A   Occupational History  . Not on file.   Social History Main Topics  . Smoking status: Never Smoker  . Smokeless tobacco: Never Used  . Alcohol use No  . Drug use: No   . Sexual activity: No     Comment: lives with wife, no dietary restrictions,  no fish   Other Topics Concern  . Not on file   Social History Narrative  . No narrative on file    Past Surgical History:  Procedure Laterality Date  . CHOLECYSTECTOMY      Family History  Problem Relation Age of Onset  . COPD Mother     worked in [employer], clots  . Peripheral vascular disease Mother   . Dementia Father   . Peripheral vascular disease Brother     Allergies  Allergen Reactions  . Fish Allergy Itching  . Propulsid [Cisapride] Hives    Current Outpatient Prescriptions on File Prior to Visit  Medication Sig Dispense Refill  . amLODipine (NORVASC) 5 MG tablet TAKE 1 TABLET (5 MG TOTAL) BY MOUTH DAILY. 90 tablet 1  . aspirin 81 MG tablet Take 81 mg by mouth 2 (two) times daily.    . clindamycin (CLEOCIN) 150 MG capsule Take 1 capsule (150 mg total) by mouth 3 (three) times daily. 15 capsule 0  . losartan (COZAAR) 50 MG tablet TAKE 1 TABLET (50 MG TOTAL) BY MOUTH DAILY. 90 tablet 1  . MAGNESIUM PO Take by mouth daily.    . Misc Natural Products (OSTEO BI-FLEX ADV JOINT SHIELD PO) Take by mouth  daily.    . Multiple Vitamins-Minerals (CENTRUM SILVER ADULT 50+ PO) Take by mouth daily.    . mupirocin ointment (BACTROBAN) 2 % Place 1 application into the nose at bedtime. 22 g 1  . Probiotic Product (PROBIOTIC DAILY) CAPS Vear Clock Colon Health-Take 1 capsule by mouth daily.     No current facility-administered medications on file prior to visit.     BP 121/69 (BP Location: Left Arm, Patient Position: Sitting, Cuff Size: Large)   Pulse 60   Temp 97.8 F (36.6 C) (Oral)   Resp 16   Ht  (1.93 m)   Wt 233 lb (105.7 kg)   SpO2 99%   BMI 28.36 kg/m       Objective:   Physical Exam General NAD.   Rt foot faint- faint pinkish red and  faintswollen on top of foot. Looks less swollen than before. Faint warmth     Assessment & Plan:  I am suspicious now that you have  gout. Normal range uric acid per our lab range but not under 6 which is considered under control by many rheumatologist.  You had improvement with otc uric tab you were improved.  Rx prednisone 20 mg taper dose prescription.  Follow up in 7-10 days or as needed  Will follow if with prednisone clears. At this point doubt any infection as finished doxycylcline and clindamycin.  Will ask staff to call pt on Wednesday am and find out if improved some.  Chisa Kushner, Ramon Dredge, PA-C

## 2016-10-17 NOTE — Progress Notes (Signed)
Pre visit review using our clinic review tool, if applicable. No additional management support is needed unless otherwise documented below in the visit note. 

## 2016-10-17 NOTE — Patient Instructions (Addendum)
I am suspicious now that you have gout. Normal range uric acid per our lab range but not under 6 which is considered under control by many rheumatologist.  You had improvement with otc uric tab you were improved.  Rx prednisone 20 mg taper dose prescription.  Follow up in 7-10 days or as needed  Will follow if with prednisone clears. At this point doubt any infection as finished doxycylcline and clindamycin.

## 2016-10-22 ENCOUNTER — Emergency Department (HOSPITAL_BASED_OUTPATIENT_CLINIC_OR_DEPARTMENT_OTHER)
Admission: EM | Admit: 2016-10-22 | Discharge: 2016-10-22 | Disposition: A | Discharge status: Home / Self Care | Payer: Medicare Other | Attending: Emergency Medicine | Admitting: Emergency Medicine

## 2016-10-22 ENCOUNTER — Emergency Department (HOSPITAL_BASED_OUTPATIENT_CLINIC_OR_DEPARTMENT_OTHER): Payer: Medicare Other

## 2016-10-22 ENCOUNTER — Encounter (HOSPITAL_BASED_OUTPATIENT_CLINIC_OR_DEPARTMENT_OTHER): Payer: Self-pay | Admitting: Emergency Medicine

## 2016-10-22 DIAGNOSIS — Z79899 Other long term (current) drug therapy: Secondary | ICD-10-CM | POA: Diagnosis not present

## 2016-10-22 DIAGNOSIS — I10 Essential (primary) hypertension: Secondary | ICD-10-CM | POA: Diagnosis not present

## 2016-10-22 DIAGNOSIS — Z7982 Long term (current) use of aspirin: Secondary | ICD-10-CM | POA: Diagnosis not present

## 2016-10-22 DIAGNOSIS — K859 Acute pancreatitis without necrosis or infection, unspecified: Secondary | ICD-10-CM

## 2016-10-22 DIAGNOSIS — K2900 Acute gastritis without bleeding: Secondary | ICD-10-CM | POA: Diagnosis not present

## 2016-10-22 DIAGNOSIS — R109 Unspecified abdominal pain: Secondary | ICD-10-CM | POA: Diagnosis not present

## 2016-10-22 DIAGNOSIS — K8500 Idiopathic acute pancreatitis without necrosis or infection: Secondary | ICD-10-CM | POA: Insufficient documentation

## 2016-10-22 DIAGNOSIS — R1013 Epigastric pain: Secondary | ICD-10-CM | POA: Diagnosis present

## 2016-10-22 HISTORY — DX: Acute pancreatitis without necrosis or infection, unspecified: K85.90

## 2016-10-22 LAB — COMPREHENSIVE METABOLIC PANEL
ALBUMIN: 3.6 g/dL (ref 3.5–5.0)
ALT: 28 U/L (ref 17–63)
AST: 28 U/L (ref 15–41)
Alkaline Phosphatase: 60 U/L (ref 38–126)
Anion gap: 6 (ref 5–15)
BUN: 25 mg/dL — AB (ref 6–20)
CHLORIDE: 103 mmol/L (ref 101–111)
CO2: 26 mmol/L (ref 22–32)
Calcium: 8.5 mg/dL — ABNORMAL LOW (ref 8.9–10.3)
Creatinine, Ser: 1.24 mg/dL (ref 0.61–1.24)
GFR calc Af Amer: >60 mL/min (ref >60)
GFR, EST NON AFRICAN AMERICAN: 56 mL/min — AB (ref >60)
Glucose, Bld: 102 mg/dL — ABNORMAL HIGH (ref 65–99)
POTASSIUM: 3.6 mmol/L (ref 3.5–5.1)
Sodium: 135 mmol/L (ref 135–145)
Total Bilirubin: 1 mg/dL (ref 0.3–1.2)
Total Protein: 6.6 g/dL (ref 6.5–8.1)

## 2016-10-22 LAB — CBC WITH DIFFERENTIAL/PLATELET
BASOS ABS: 0 10*3/uL (ref 0.0–0.1)
BASOS PCT: 0 %
Band Neutrophils: 1 %
EOS ABS: 0 10*3/uL (ref 0.0–0.7)
Eosinophils Relative: 0 %
HCT: 37.2 % — ABNORMAL LOW (ref 39.0–52.0)
Hemoglobin: 12.7 g/dL — ABNORMAL LOW (ref 13.0–17.0)
LYMPHS PCT: 17 %
Lymphs Abs: 1.8 10*3/uL (ref 0.7–4.0)
MCH: 32.2 pg (ref 26.0–34.0)
MCHC: 34.1 g/dL (ref 30.0–36.0)
MCV: 94.2 fL (ref 78.0–100.0)
MONO ABS: 1.5 10*3/uL — AB (ref 0.1–1.0)
Monocytes Relative: 14 %
NEUTROS PCT: 68 %
Neutro Abs: 7.1 10*3/uL (ref 1.7–7.7)
Platelets: 271 10*3/uL (ref 150–400)
RBC: 3.95 MIL/uL — AB (ref 4.22–5.81)
RDW: 13.1 % (ref 11.5–15.5)
WBC: 10.4 10*3/uL (ref 4.0–10.5)

## 2016-10-22 LAB — LIPASE, BLOOD: LIPASE: 101 U/L — AB (ref 11–51)

## 2016-10-22 LAB — TROPONIN I: Troponin I: <0.03 ng/mL (ref <0.03)

## 2016-10-22 MED ORDER — HYDROCODONE-ACETAMINOPHEN 5-325 MG PO TABS: 1.0000 | ORAL_TABLET | ORAL | 0 refills | Status: DC | PRN

## 2016-10-22 MED ORDER — GI COCKTAIL ~~LOC~~
30.0000 mL | Freq: Once | ORAL | Status: AC
  Administered 2016-10-22: 30 mL via ORAL
  Filled 2016-10-22: qty 30

## 2016-10-22 MED ORDER — FAMOTIDINE 20 MG PO TABS: 20.0000 mg | ORAL_TABLET | Freq: Two times a day (BID) | ORAL | 0 refills | Status: DC

## 2016-10-22 MED ORDER — SUCRALFATE 1 GM/10ML PO SUSP: 1.0000 g | Freq: Three times a day (TID) | ORAL | 0 refills | Status: DC

## 2016-10-22 MED ORDER — SODIUM CHLORIDE 0.9 % IV BOLUS (SEPSIS)
500.0000 mL | Freq: Once | INTRAVENOUS | Status: AC
  Administered 2016-10-22: 500 mL via INTRAVENOUS

## 2016-10-22 MED ORDER — IOPAMIDOL (ISOVUE-300) INJECTION 61%
100.0000 mL | Freq: Once | INTRAVENOUS | Status: AC | PRN
  Administered 2016-10-22: 100 mL via INTRAVENOUS

## 2016-10-22 MED ORDER — FAMOTIDINE IN NACL 20-0.9 MG/50ML-% IV SOLN
20.0000 mg | Freq: Once | INTRAVENOUS | Status: AC
  Administered 2016-10-22: 20 mg via INTRAVENOUS
  Filled 2016-10-22: qty 50

## 2016-10-22 NOTE — Discharge Instructions (Signed)
Do not take any more prednisone. Use Vicodin if needed for pain related to gout. See her doctor for recheck at the beginning of the week. If you have any worsening or changing symptoms return to the hospital.

## 2016-10-22 NOTE — ED Notes (Signed)
Patient transported to CT 

## 2016-10-22 NOTE — ED Notes (Signed)
ED Provider at bedside. 

## 2016-10-22 NOTE — ED Notes (Signed)
Pt sts he is starting to feel some relief after GI cocktail

## 2016-10-22 NOTE — ED Notes (Signed)
Pt drinking CT contrast and tolerating well.

## 2016-10-22 NOTE — ED Provider Notes (Signed)
[facility] Note   CSN: [id number] Arrival date & time: 10/22/16  1131     History   Chief Complaint Chief Complaint  Patient presents with  . Abdominal Pain    HPI Anthony Poole is a 76 y.o. male.  HPI Patient reports he has been getting epigastric and central abdominal pain for about 4 days. He knows that it really seemed to start after eating chili Thursday night. He reports his statement about the same location without radiation into his chest. He reports it was not worse at night lying down. He reports yesterday around lunch he did not really feel like eating but then a little later he and his wife went to our reason he had a roast B sandwich and an apple turnover. He reports that last night the pain was a lot worse and it has continued into today. He has also had 2 courses of antibiotics which she has finished. They were treating a redness of the ankle and foot that was presumed to be cellulitis. His doctor however determined it was gout after it had no response to antibiotics and started him on prednisone. Patient reports he is almost finished with prednisone and has about 2 more days. He reports the redness of the foot pain has improved significantly. Patient takes daily Nexium for reflux. Past Medical History:  Diagnosis Date  . Anemia 07/29/2012  . 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 Epistaxis 09/30/2016  . GERD (gastroesophageal reflux disease)   . Headache 04/20/2014  . Hypertension   . Medicare annual wellness visit, subsequent 04/14/2014  . Obesity 10/20/2014  . Other and unspecified hyperlipidemia 04/14/2013  . Preventative health care 07/29/2012  . Shingles 12/12/2012  . Sinusitis, acute 09/05/2012  . Tachycardia 10/14/2014  . Tick bite 11/19/2015    Patient Active Problem List   Diagnosis Date Noted  . Epistaxis 09/30/2016  . 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 Hyperglycemia 06/05/2016  . Rash and nonspecific skin eruption  11/19/2015  . Foot swelling 08/10/2015  . Obesity 10/20/2014  . Headache 04/20/2014  . Medicare annual wellness visit, subsequent 04/14/2014  . Otitis media 10/20/2013  . Allergic state 10/20/2013  . Hyperlipidemia, mixed 04/14/2013  . Shingles 12/12/2012  . Chronic knee pain 11/21/2012  . TIA (transient ischemic attack) 09/05/2012  . Preventative health care 07/29/2012  . Anemia 07/29/2012  . Back pain 04/29/2012  . Essential hypertension 04/02/2012  . GERD (gastroesophageal reflux disease) 04/02/2012    Past Surgical History:  Procedure Laterality Date  . CHOLECYSTECTOMY         Home Medications    Prior to Admission medications   Medication Sig Start Date End Date Taking? Authorizing Provider  amLODipine (NORVASC) 5 MG tablet TAKE 1 TABLET (5 MG TOTAL) BY MOUTH DAILY. 06/16/16   Bradd Canary, MD  aspirin 81 MG tablet Take 81 mg by mouth 2 (two) times daily.    Historical Provider, MD  famotidine (PEPCID) 20 MG tablet Take 1 tablet (20 mg total) by mouth 2 (two) times daily. 10/22/16   Arby Barrette, MD  HYDROcodone-acetaminophen (NORCO/VICODIN) 5-325 MG tablet Take 1-2 tablets by mouth every 4 (four) hours as needed for moderate pain or severe pain. 10/22/16   Arby Barrette, MD  losartan (COZAAR) 50 MG tablet TAKE 1 TABLET (50 MG TOTAL) BY MOUTH DAILY. 05/12/16   Bradd Canary, MD  MAGNESIUM PO Take by mouth daily.    Historical Provider, MD  Misc Natural Products (OSTEO BI-FLEX ADV JOINT SHIELD PO) Take by mouth daily.    Historical Provider, MD  Multiple Vitamins-Minerals (CENTRUM SILVER ADULT 50+ PO) Take by mouth daily.    Historical Provider, MD  mupirocin ointment (BACTROBAN) 2 % Place 1 application into the nose at bedtime. 09/26/16   Bradd Canary, MD  predniSONE (DELTASONE) 20 MG tablet 1 tab tid day 1 and day 2.  1 tab po bid day 3 and day 4.  1 tab po day 5 and day 6 10/17/16   Esperanza Richters, PA-C  Probiotic Product (PROBIOTIC DAILY) CAPS Vear Clock Colon  Health-Take 1 capsule by mouth daily.    Historical Provider, MD  sucralfate (CARAFATE) 1 GM/10ML suspension Take 10 mLs (1 g total) by mouth 4 (four) times daily -  with meals and at bedtime. 10/22/16   Arby Barrette, MD    Family History Family History  Problem Relation Age of Onset  . COPD Mother     worked in [employer], clots  . Peripheral vascular disease Mother   . Dementia Father   . Peripheral vascular disease Brother     Social History Social History  Substance Use Topics  . Smoking status: Never Smoker  . Smokeless tobacco: Never Used  . Alcohol use No     Allergies   Fish allergy and Propulsid [cisapride]   Review of Systems Review of Systems 10 Systems reviewed and are negative for acute change except as noted in the HPI.  Physical Exam Updated Vital Signs BP 140/90   Pulse (!) 50   Temp 98.1 F (36.7 C) (Oral)   Resp 19   Ht  (1.905 m)   Wt 233 lb (105.7 kg)   SpO2 100%   BMI 29.12 kg/m   Physical Exam  Constitutional: He is oriented to person, place, and time. He appears well-developed and well-nourished.  HENT:  Head: Normocephalic and atraumatic.  Mouth/Throat: Oropharynx is clear and moist.  Eyes: Conjunctivae and EOM are normal.  Neck: Neck supple.  Cardiovascular: Normal rate and regular rhythm.   No murmur heard. Pulmonary/Chest: Effort normal and breath sounds normal. No respiratory distress.  Abdominal: Soft. He exhibits no distension. There is tenderness.  Mild to moderate discomfort to deep palpation in the epigastrium and supra umbilical region. No guarding. No lower abdominal pain.  Musculoskeletal: Normal range of motion. He exhibits no edema or tenderness.  Neurological: He is alert and oriented to person, place, and time. No cranial nerve deficit. He exhibits normal muscle tone. Coordination normal.  Skin: Skin is warm and dry.  Psychiatric: He has a normal mood and affect.  Nursing note and vitals reviewed.    ED  Treatments / Results  Labs (all labs ordered are listed, but only abnormal results are displayed) Labs Reviewed  COMPREHENSIVE METABOLIC PANEL - Abnormal; Notable for the following:       Result Value   Glucose, Bld 102 (*)    BUN 25 (*)    Calcium 8.5 (*)    GFR calc non Af Amer 56 (*)    All other components within normal limits  LIPASE, BLOOD - Abnormal; Notable for the following:    Lipase 101 (*)    All other components within normal limits  CBC WITH DIFFERENTIAL/PLATELET - Abnormal; Notable for the following:    RBC 3.95 (*)    Hemoglobin 12.7 (*)    HCT 37.2 (*)    Monocytes Absolute 1.5 (*)    All  other components within normal limits  TROPONIN I    EKG  EKG Interpretation None       Radiology Ct Abdomen Pelvis W Contrast  Result Date: 10/22/2016 CLINICAL DATA:  Pt with epigastric abdominal pain x several days, worse today, nausea, denies v/d or constipation, denies feverElevated lipase, normal wbc EXAM: CT ABDOMEN AND PELVIS WITH CONTRAST TECHNIQUE: Multidetector CT imaging of the abdomen and pelvis was performed using the standard protocol following bolus administration of intravenous contrast. CONTRAST:  ISOVUE-300 IOPAMIDOL (ISOVUE-300) INJECTION 61% COMPARISON:  None. FINDINGS: Lower chest: No acute abnormality. Hepatobiliary: No focal liver abnormality is seen. Status post cholecystectomy. No biliary dilatation. Pancreas: There is mild inflammation adjacent to the pancreatic head. No pancreatic mass. There is homogeneous pancreatic enhancement. No peripancreatic fluid collection to suggest a pseudocyst or abscess. Spleen: Normal in size without focal abnormality. Adrenals/Urinary Tract: No adrenal masses. Mild bilateral renal cortical thinning. Symmetric renal enhancement excretion. Bilateral cracks and small bilateral renal sinus cysts. Posterior lower pole left renal cortical cyst measuring 1 cm. No other masses, no stones and no hydronephrosis. Normal ureters.  Bladder is unremarkable. Stomach/Bowel: Stomach is unremarkable. There is mild wall thickening of the medial wall of the second and third portions of the duodenum which is presumed reactive to the adjacent pancreatic inflammation. Small bowel is unremarkable. There are multiple colonic diverticula without diverticulitis. No other colonic abnormality. Normal appendix. Vascular/Lymphatic: No significant vascular findings are present. The portal vein, superior mesenteric vein and splenic veins are widely patent. No enlarged abdominal or pelvic lymph nodes. Reproductive: Prostate is mildly enlarged. Other: No abdominal wall hernia.  No ascites. Musculoskeletal: No fracture or acute finding. No osteoblastic or osteolytic lesions. IMPRESSION: 1. Mild uncomplicated pancreatitis involving the pancreatic head. No evidence of pancreatic necrosis, abscess or pseudocyst or venous thrombosis. 2. No other acute abnormalities within the abdomen or pelvis. 3. Status post cholecystectomy. 4. Renal cysts. 5. Colonic diverticula. Electronically Signed   By: Amie Portland M.D.   On: 10/22/2016 15:22    Procedures Procedures (including critical care time)  Medications Ordered in ED Medications  gi cocktail (Maalox,Lidocaine,Donnatal) (30 mLs Oral Given 10/22/16 1241)  famotidine (PEPCID) IVPB 20 mg premix (0 mg Intravenous Stopped 10/22/16 1319)  sodium chloride 0.9 % bolus 500 mL (0 mLs Intravenous Stopped 10/22/16 1443)  iopamidol (ISOVUE-300) 61 % injection 100 mL (100 mLs Intravenous Contrast Given 10/22/16 1459)     Initial Impression / Assessment and Plan / ED Course  I have reviewed the triage vital signs and the nursing notes.  Pertinent labs & imaging results that were available during my care of the patient were reviewed by me and considered in my medical decision making (see chart for details).      Final Clinical Impressions(s) / ED Diagnoses   Final diagnoses:  Idiopathic acute pancreatitis without  infection or necrosis  Acute gastritis without hemorrhage, unspecified gastritis type   Patient's lipase is elevated mildly at 100. CT confirms some pancreatic inflammation without other acute findings of abscess or pseudocyst. At this time, patient is a good candidate for outpatient therapy of pancreatitis. Symptoms were actually significantly improved by GI cocktail and Pepcid. I also suspect a component of gastritis. Patient has had 2 courses of antibiotics and also has been taking prednisone. He already takes Nexium, I will add Pepcid and Carafate for additional gastric protection for gastritis. She has been counseled on dietary measures for controlling pancreatitis. He is made aware of the importance of returning  if his pain should be worsening or he develops vomiting or other concerning symptoms. New Prescriptions New Prescriptions   FAMOTIDINE (PEPCID) 20 MG TABLET    Take 1 tablet (20 mg total) by mouth 2 (two) times daily.   HYDROCODONE-ACETAMINOPHEN (NORCO/VICODIN) 5-325 MG TABLET    Take 1-2 tablets by mouth every 4 (four) hours as needed for moderate pain or severe pain.   SUCRALFATE (CARAFATE) 1 GM/10ML SUSPENSION    Take 10 mLs (1 g total) by mouth 4 (four) times daily -  with meals and at bedtime.     Arby Barrette, MD 10/22/16 [phone]

## 2016-10-24 ENCOUNTER — Telehealth: Payer: Self-pay | Admitting: Family Medicine

## 2016-10-24 NOTE — Telephone Encounter (Signed)
He needs an ER follow up not a hospital follow up. Can we squeeze him in in next week or so?

## 2016-10-24 NOTE — Telephone Encounter (Signed)
ER follow-up scheduled 10/25/16 at 3:30pm w/ PCP.

## 2016-10-24 NOTE — Telephone Encounter (Signed)
Pt's spouse called in to schedule a hospital fu appt. Pt was discharge from the hospital today and advised to fu with pcp tomorrow. Informed pt of pcp's schedule. Next available hospital fu available is on 11/01/16. Is this okay or should pt come in sooner? Please advise for scheduling.

## 2016-10-25 ENCOUNTER — Ambulatory Visit (INDEPENDENT_AMBULATORY_CARE_PROVIDER_SITE_OTHER): Payer: Medicare Other | Admitting: Family Medicine

## 2016-10-25 ENCOUNTER — Encounter: Payer: Self-pay | Admitting: Family Medicine

## 2016-10-25 DIAGNOSIS — M109 Gout, unspecified: Secondary | ICD-10-CM | POA: Diagnosis not present

## 2016-10-25 DIAGNOSIS — I10 Essential (primary) hypertension: Secondary | ICD-10-CM

## 2016-10-25 DIAGNOSIS — K5900 Constipation, unspecified: Secondary | ICD-10-CM

## 2016-10-25 DIAGNOSIS — E6609 Other obesity due to excess calories: Secondary | ICD-10-CM | POA: Diagnosis not present

## 2016-10-25 DIAGNOSIS — K859 Acute pancreatitis without necrosis or infection, unspecified: Secondary | ICD-10-CM | POA: Diagnosis not present

## 2016-10-25 HISTORY — DX: Acute pancreatitis without necrosis or infection, unspecified: K85.90

## 2016-10-25 MED ORDER — ALLOPURINOL 100 MG PO TABS: 100.0000 mg | ORAL_TABLET | Freq: Every day | ORAL | 2 refills | Status: DC

## 2016-10-25 NOTE — Progress Notes (Signed)
Patient ID: Anthony Poole, male   DOB: Apr 28, 1941, 76 y.o.   MRN: [id number]   Subjective:  I acted as a Neurosurgeon for Anthony Edge, MD. Anthony Poole, [place]   Patient ID: Anthony Poole, male    DOB: 24-Apr-1941, 76 y.o.   MRN: [id number]  Chief Complaint  Patient presents with  . Follow-up    ER F/U. Seen in the ED on 10/22/2016.    HPI  Patient is in today for an Emergency Department follow up. patient was seen in the ED on 10/22/2016. States that he was given a Dx of Pancreatitis. Also wants to discuss medication for gout (right foot). Patient has a Hx of HTN, GERD, anemia, hyperlipidemia. Patient has no additional acute concerns noted at this time. He presented to ED after rapid onset of abdominal pain, nausea, vomiting, diarrhea. In ED was diagnosed with pancreatitis with elevated lipase. They were able to control his symptoms and he was sent home. Since being home his pain is improving. No further vomiting or diarrhea. Some mild nausea persists and he has not moved his bowels for roughly 4-5 days. No bloody or tarry stool. Anorexia is improving. Denies CP/palp/SOB/HA/congestion/fevers/GU c/o. Taking meds as prescribed  Patient Care Team: Bradd Canary, MD as PCP - General (Family Medicine)   Past Medical History:  Diagnosis Date  . Acute pancreatitis 10/25/2016  . Anemia 07/29/2012  . 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 Constipation 10/26/2016  . Epistaxis 09/30/2016  . GERD (gastroesophageal reflux disease)   . Headache 04/20/2014  . Hypertension   . Medicare annual wellness visit, subsequent 04/14/2014  . Obesity 10/20/2014  . Other and unspecified hyperlipidemia 04/14/2013  . Pancreatitis 10/22/2016  . Preventative health care 07/29/2012  . Shingles 12/12/2012  . Sinusitis, acute 09/05/2012  . Tachycardia 10/14/2014  . Tick bite 11/19/2015    Past Surgical History:  Procedure Laterality Date  . CHOLECYSTECTOMY      Family History  Problem Relation Age of  Onset  . COPD Mother     worked in [employer], clots  . Peripheral vascular disease Mother   . Dementia Father   . Peripheral vascular disease Brother     Social History   Social History  . Marital status: Married    Spouse name: N/A  . Number of children: N/A  . Years of education: N/A   Occupational History  . Not on file.   Social History Main Topics  . Smoking status: Never Smoker  . Smokeless tobacco: Never Used  . Alcohol use No  . Drug use: No  . Sexual activity: No     Comment: lives with wife, no dietary restrictions,  no fish   Other Topics Concern  . Not on file   Social History Narrative  . No narrative on file    Outpatient Medications Prior to Visit  Medication Sig Dispense Refill  . amLODipine (NORVASC) 5 MG tablet TAKE 1 TABLET (5 MG TOTAL) BY MOUTH DAILY. 90 tablet 1  . aspirin 81 MG tablet Take 81 mg by mouth 2 (two) times daily.    . famotidine (PEPCID) 20 MG tablet Take 1 tablet (20 mg total) by mouth 2 (two) times daily. 30 tablet 0  . HYDROcodone-acetaminophen (NORCO/VICODIN) 5-325 MG tablet Take 1-2 tablets by mouth every 4 (four) hours as needed for moderate pain or severe pain. 20 tablet 0  . losartan (COZAAR) 50 MG tablet TAKE 1 TABLET (50 MG TOTAL) BY  MOUTH DAILY. 90 tablet 1  . MAGNESIUM PO Take by mouth daily.    . Misc Natural Products (OSTEO BI-FLEX ADV JOINT SHIELD PO) Take by mouth daily.    . Multiple Vitamins-Minerals (CENTRUM SILVER ADULT 50+ PO) Take by mouth daily.    . mupirocin ointment (BACTROBAN) 2 % Place 1 application into the nose at bedtime. 22 g 1  . Probiotic Product (PROBIOTIC DAILY) CAPS Vear Clock Colon Health-Take 1 capsule by mouth daily.    . sucralfate (CARAFATE) 1 GM/10ML suspension Take 10 mLs (1 g total) by mouth 4 (four) times daily -  with meals and at bedtime. 420 mL 0  . predniSONE (DELTASONE) 20 MG tablet 1 tab tid day 1 and day 2.  1 tab po bid day 3 and day 4.  1 tab po day 5 and day 6 (Patient not  taking: Reported on 10/25/2016) 12 tablet 0   No facility-administered medications prior to visit.     Allergies  Allergen Reactions  . Fish Allergy Itching  . Propulsid [Cisapride] Hives    Review of Systems  Constitutional: Positive for malaise/fatigue. Negative for fever.  HENT: Negative for congestion.   Eyes: Negative for blurred vision.  Respiratory: Negative for cough and shortness of breath.   Cardiovascular: Negative for chest pain, palpitations and leg swelling.  Gastrointestinal: Positive for abdominal pain, diarrhea, nausea and vomiting.  Musculoskeletal: Positive for joint pain. Negative for back pain.       Gout pain. On top of R foot.  Skin: Negative for rash.  Neurological: Negative for loss of consciousness and headaches.       Objective:    Physical Exam  Constitutional: He is oriented to person, place, and time. He appears well-developed and well-nourished. No distress.  HENT:  Head: Normocephalic and atraumatic.  Eyes: Conjunctivae are normal.  Neck: Normal range of motion. No thyromegaly present.  Cardiovascular: Normal rate and regular rhythm.   Pulmonary/Chest: Effort normal and breath sounds normal. He has no wheezes.  Abdominal: Soft. Bowel sounds are normal. He exhibits no mass. There is tenderness. There is no rebound and no guarding.  Musculoskeletal: He exhibits no edema or deformity.  Neurological: He is alert and oriented to person, place, and time.  Skin: Skin is warm and dry. He is not diaphoretic.  Psychiatric: He has a normal mood and affect.    BP 120/68 (BP Location: Left Arm, Patient Position: Sitting, Cuff Size: Normal)   Pulse 65   Temp 98.1 F (36.7 C) (Oral)   Wt 228 lb (103.4 kg)   SpO2 98% Comment: RA  BMI 28.50 kg/m  Wt Readings from Last 3 Encounters:  10/25/16 228 lb (103.4 kg)  10/22/16 233 lb (105.7 kg)  10/17/16 233 lb (105.7 kg)   BP Readings from Last 3 Encounters:  10/25/16 120/68  10/22/16 140/90  10/17/16  121/69     Immunization History  Administered Date(s) Administered  . Influenza Split 04/02/2012  . Influenza, High Dose Seasonal PF 04/12/2013, 05/23/2016  . Influenza,inj,Quad PF,36+ Mos 04/14/2014, 05/20/2015  . Pneumococcal Conjugate-13 04/14/2014  . Pneumococcal Polysaccharide-23 09/26/2016  . Tdap 10/06/2011  . Zoster 10/06/2011    Health Maintenance  Topic Date Due  . INFLUENZA VACCINE  02/01/2017  . TETANUS/TDAP  10/05/2021  . COLONOSCOPY  06/13/2026  . PNA vac Low Risk Adult  Completed    Lab Results  Component Value Date   WBC 10.4 10/22/2016   HGB 12.7 (L) 10/22/2016   HCT 37.2 (L)  10/22/2016   PLT 271 10/22/2016   GLUCOSE 102 (H) 10/22/2016   CHOL 167 05/23/2016   TRIG 76.0 05/23/2016   HDL 50.70 05/23/2016   LDLCALC 101 (H) 05/23/2016   ALT 28 10/22/2016   AST 28 10/22/2016   NA 135 10/22/2016   K 3.6 10/22/2016   CL 103 10/22/2016   CREATININE 1.24 10/22/2016   BUN 25 (H) 10/22/2016   CO2 26 10/22/2016   TSH 1.09 05/23/2016   PSA 0.47 04/14/2014   HGBA1C 5.5 05/23/2016    Lab Results  Component Value Date   TSH 1.09 05/23/2016   Lab Results  Component Value Date   WBC 10.4 10/22/2016   HGB 12.7 (L) 10/22/2016   HCT 37.2 (L) 10/22/2016   MCV 94.2 10/22/2016   PLT 271 10/22/2016   Lab Results  Component Value Date   NA 135 10/22/2016   K 3.6 10/22/2016   CO2 26 10/22/2016   GLUCOSE 102 (H) 10/22/2016   BUN 25 (H) 10/22/2016   CREATININE 1.24 10/22/2016   BILITOT 1.0 10/22/2016   ALKPHOS 60 10/22/2016   AST 28 10/22/2016   ALT 28 10/22/2016   PROT 6.6 10/22/2016   ALBUMIN 3.6 10/22/2016   CALCIUM 8.5 (L) 10/22/2016   ANIONGAP 6 10/22/2016   GFR 64.70 05/23/2016   Lab Results  Component Value Date   CHOL 167 05/23/2016   Lab Results  Component Value Date   HDL 50.70 05/23/2016   Lab Results  Component Value Date   LDLCALC 101 (H) 05/23/2016   Lab Results  Component Value Date   TRIG 76.0 05/23/2016   Lab Results    Component Value Date   CHOLHDL 3 05/23/2016   Lab Results  Component Value Date   HGBA1C 5.5 05/23/2016         Assessment & Plan:   Problem List Items Addressed This Visit    Essential hypertension    Well controlled, no changes to meds. Encouraged heart healthy diet such as the DASH diet and exercise as tolerated.       Obesity    Encouraged DASH diet, decrease po intake and increase exercise as tolerated. Needs 7-8 hours of sleep nightly. Avoid trans fats, eat small, frequent meals every 4-5 hours with lean proteins, complex carbs and healthy fats. Minimize simple carbs, consider bariatrics      Gout of foot    Avoid offending foods, stay well hydrated and start Allopurinol next week      Relevant Medications   allopurinol (ZYLOPRIM) 100 MG tablet   Acute pancreatitis    Doing much better since his ER visit. He is reminded to stay well hydrated, avoid spicy and fatty food and maintain a BRAT type diet for next week or so. Seek care if pain escalates again      Constipation    Encouraged increased hydration and fiber in diet. Daily probiotics. If bowels not moving can use MOM 2 tbls po in 4 oz of warm prune juice by mouth every 2-3 days. If no results then repeat in 4 hours with  Dulcolax suppository pr, may repeat again in 4 more hours as needed. Seek care if symptoms worsen. Consider daily Miralax and/or Dulcolax if symptoms persist.          I have discontinued Mr. Everson's predniSONE, doxycycline, and clindamycin. I am also having him start on allopurinol. Additionally, I am having him maintain his Multiple Vitamins-Minerals (CENTRUM SILVER ADULT 50+ PO), Misc Natural Products (OSTEO BI-FLEX  ADV JOINT SHIELD PO), MAGNESIUM PO, aspirin, PROBIOTIC DAILY, losartan, amLODipine, mupirocin ointment, famotidine, sucralfate, and HYDROcodone-acetaminophen.  Meds ordered this encounter  Medications  . DISCONTD: doxycycline (VIBRAMYCIN) 100 MG capsule  . DISCONTD:  clindamycin (CLEOCIN) 150 MG capsule  . allopurinol (ZYLOPRIM) 100 MG tablet    Sig: Take 1 tablet (100 mg total) by mouth daily.    Dispense:  30 tablet    Refill:  2    CMA served as scribe during this visit. History, Physical and Plan performed by medical provider. Documentation and orders reviewed and attested to.  Anthony Edge, MD

## 2016-10-25 NOTE — Patient Instructions (Addendum)
Drink 4oz prune juice and 2 tablespoon milk of magnesia as needed (for constipation). Encouraged to start eating a bland diet. In two weeks, start taking the Allopurinol  every day (for gout). Acute Pancreatitis Acute pancreatitis is a condition in which the pancreas suddenly gets irritated and swollen (has inflammation). The pancreas is a large gland behind the stomach. It makes enzymes that help to digest food. The pancreas also makes hormones that help to control your blood sugar. Acute pancreatitis happens when the enzymes attack the pancreas and damage it. Most attacks last a couple of days and can cause serious problems. Follow these instructions at home: Eating and drinking   Follow instructions from your doctor about diet. You may need to:  Avoid alcohol.  Limit how much fat is in your diet.  Eat small meals often. Avoid eating big meals.  Drink enough fluid to keep your pee (urine) clear or pale yellow.  Do not drink alcohol if it caused your condition. General instructions   Take over-the-counter and prescription medicines only as told by your doctor.  Do not use any tobacco products. These include cigarettes, chewing tobacco, and e-cigarettes. If you need help quitting, ask your doctor.  Get plenty of rest.  If directed, check your blood sugar at home as told by your doctor.  Keep all follow-up visits as told by your doctor. This is important. Contact a doctor if:  You do not get better as quickly as expected.  You have new symptoms.  Your symptoms get worse.  You have lasting pain or weakness.  You continue to feel sick to your stomach (nauseous).  You get better and then you have another pain attack.  You have a fever. Get help right away if:  You cannot eat or keep fluids down.  Your pain becomes very bad.  Your skin or the white part of your eyes turns yellow (jaundice).  You throw up (vomit).  You feel dizzy or you pass out (faint).  Your  blood sugar is high (over 300 mg/dL). This information is not intended to replace advice given to you by your health care provider. Make sure you discuss any questions you have with your health care provider. Document Released: 12/07/2007 Document Revised: 11/26/2015 Document Reviewed: 03/24/2015 Elsevier Interactive Patient Education  2017 [employer].

## 2016-10-25 NOTE — Assessment & Plan Note (Addendum)
Doing much better since his ER visit. He is reminded to stay well hydrated, avoid spicy and fatty food and maintain a BRAT type diet for next week or so. Seek care if pain escalates again

## 2016-10-25 NOTE — Assessment & Plan Note (Signed)
Well controlled, no changes to meds. Encouraged heart healthy diet such as the DASH diet and exercise as tolerated.  °

## 2016-10-25 NOTE — Progress Notes (Signed)
Pre visit review using our clinic review tool, if applicable. No additional management support is needed unless otherwise documented below in the visit note. 

## 2016-10-26 ENCOUNTER — Encounter: Payer: Self-pay | Admitting: Family Medicine

## 2016-10-26 DIAGNOSIS — K5900 Constipation, unspecified: Secondary | ICD-10-CM | POA: Insufficient documentation

## 2016-10-26 HISTORY — DX: Constipation, unspecified: K59.00

## 2016-10-26 NOTE — Assessment & Plan Note (Signed)
Encouraged increased hydration and fiber in diet. Daily probiotics. If bowels not moving can use MOM 2 tbls po in 4 oz of warm prune juice by mouth every 2-3 days. If no results then repeat in 4 hours with  Dulcolax suppository pr, may repeat again in 4 more hours as needed. Seek care if symptoms worsen. Consider daily Miralax and/or Dulcolax if symptoms persist.  

## 2016-10-26 NOTE — Assessment & Plan Note (Signed)
Avoid offending foods, stay well hydrated and start Allopurinol next week

## 2016-10-26 NOTE — Assessment & Plan Note (Signed)
Encouraged DASH diet, decrease po intake and increase exercise as tolerated. Needs 7-8 hours of sleep nightly. Avoid trans fats, eat small, frequent meals every 4-5 hours with lean proteins, complex carbs and healthy fats. Minimize simple carbs, consider bariatrics 

## 2016-10-27 ENCOUNTER — Emergency Department (HOSPITAL_BASED_OUTPATIENT_CLINIC_OR_DEPARTMENT_OTHER)
Admission: EM | Admit: 2016-10-27 | Discharge: 2016-10-27 | Disposition: A | Discharge status: Home / Self Care | Payer: Medicare Other | Attending: Emergency Medicine | Admitting: Emergency Medicine

## 2016-10-27 ENCOUNTER — Encounter (HOSPITAL_BASED_OUTPATIENT_CLINIC_OR_DEPARTMENT_OTHER): Payer: Self-pay | Admitting: *Deleted

## 2016-10-27 ENCOUNTER — Emergency Department (HOSPITAL_BASED_OUTPATIENT_CLINIC_OR_DEPARTMENT_OTHER): Payer: Medicare Other

## 2016-10-27 DIAGNOSIS — I10 Essential (primary) hypertension: Secondary | ICD-10-CM | POA: Diagnosis not present

## 2016-10-27 DIAGNOSIS — Z7982 Long term (current) use of aspirin: Secondary | ICD-10-CM | POA: Diagnosis not present

## 2016-10-27 DIAGNOSIS — K5900 Constipation, unspecified: Secondary | ICD-10-CM | POA: Diagnosis not present

## 2016-10-27 DIAGNOSIS — R1032 Left lower quadrant pain: Secondary | ICD-10-CM

## 2016-10-27 DIAGNOSIS — R109 Unspecified abdominal pain: Secondary | ICD-10-CM | POA: Diagnosis not present

## 2016-10-27 LAB — CBC WITH DIFFERENTIAL/PLATELET
BASOS ABS: 0 10*3/uL (ref 0.0–0.1)
Basophils Relative: 0 %
Eosinophils Absolute: 0 10*3/uL (ref 0.0–0.7)
Eosinophils Relative: 0 %
HEMATOCRIT: 40.1 % (ref 39.0–52.0)
HEMOGLOBIN: 13.9 g/dL (ref 13.0–17.0)
Lymphocytes Relative: 11 %
Lymphs Abs: 1 10*3/uL (ref 0.7–4.0)
MCH: 31.7 pg (ref 26.0–34.0)
MCHC: 34.7 g/dL (ref 30.0–36.0)
MCV: 91.6 fL (ref 78.0–100.0)
MONOS PCT: 11 %
Monocytes Absolute: 1 10*3/uL (ref 0.1–1.0)
NEUTROS ABS: 7 10*3/uL (ref 1.7–7.7)
NEUTROS PCT: 78 %
Platelets: 294 10*3/uL (ref 150–400)
RBC: 4.38 MIL/uL (ref 4.22–5.81)
RDW: 12.8 % (ref 11.5–15.5)
WBC: 8.9 10*3/uL (ref 4.0–10.5)

## 2016-10-27 LAB — LIPASE, BLOOD: Lipase: 22 U/L (ref 11–51)

## 2016-10-27 LAB — COMPREHENSIVE METABOLIC PANEL
ALBUMIN: 3.4 g/dL — AB (ref 3.5–5.0)
ALT: 28 U/L (ref 17–63)
ANION GAP: 11 (ref 5–15)
AST: 27 U/L (ref 15–41)
Alkaline Phosphatase: 75 U/L (ref 38–126)
BILIRUBIN TOTAL: 1.5 mg/dL — AB (ref 0.3–1.2)
BUN: 18 mg/dL (ref 6–20)
CHLORIDE: 101 mmol/L (ref 101–111)
CO2: 24 mmol/L (ref 22–32)
Calcium: 8.6 mg/dL — ABNORMAL LOW (ref 8.9–10.3)
Creatinine, Ser: 1.48 mg/dL — ABNORMAL HIGH (ref 0.61–1.24)
GFR calc Af Amer: 52 mL/min — ABNORMAL LOW (ref >60)
GFR calc non Af Amer: 45 mL/min — ABNORMAL LOW (ref >60)
GLUCOSE: 118 mg/dL — AB (ref 65–99)
POTASSIUM: 3.8 mmol/L (ref 3.5–5.1)
Sodium: 136 mmol/L (ref 135–145)
Total Protein: 7 g/dL (ref 6.5–8.1)

## 2016-10-27 LAB — PROTIME-INR
INR: 1.13
PROTHROMBIN TIME: 14.6 s (ref 11.4–15.2)

## 2016-10-27 LAB — I-STAT CG4 LACTIC ACID, ED: LACTIC ACID, VENOUS: 1.35 mmol/L (ref 0.5–1.9)

## 2016-10-27 MED ORDER — SODIUM CHLORIDE 0.9 % IV BOLUS (SEPSIS)
1000.0000 mL | Freq: Once | INTRAVENOUS | Status: AC
  Administered 2016-10-27: 1000 mL via INTRAVENOUS

## 2016-10-27 MED ORDER — IOPAMIDOL (ISOVUE-300) INJECTION 61%
100.0000 mL | Freq: Once | INTRAVENOUS | Status: AC | PRN
  Administered 2016-10-27: 80 mL via INTRAVENOUS

## 2016-10-27 MED ORDER — POLYETHYLENE GLYCOL 3350 17 G PO PACK: 17.0000 g | PACK | Freq: Every day | ORAL | 0 refills | Status: DC

## 2016-10-27 NOTE — ED Provider Notes (Signed)
[facility]   CSN: [id number] Arrival date & time: 10/27/16  1328     History   Chief Complaint Chief Complaint  Patient presents with  . Constipation    HPI Anthony Poole is a 76 y.o. male.  The history is provided by the patient and the spouse.  Abdominal Pain   This is a new problem. The current episode started more than 2 days ago. The problem occurs constantly. The problem has not changed since onset.The pain is associated with a previous surgery (prior chole). The pain is located in the LLQ. The quality of the pain is cramping and pressure-like. The pain is at a severity of 9/10. The pain is severe. Associated symptoms include nausea and constipation. Pertinent negatives include fever, diarrhea, melena, vomiting, dysuria, frequency and hematuria. Flatus: deacreased. The symptoms are aggravated by palpation. Nothing relieves the symptoms. Past workup includes CT scan. Past medical history comments: recent pancreatitis. .  Constipation   This is a new problem. The current episode started more than 2 days ago. The stool is described as watery and firm. Associated symptoms include abdominal pain. Pertinent negatives include no dysuria. Flatus: deacreased. He has tried osmotic agents for the symptoms. The treatment provided no relief. Risk factors include a change in medication usage/dosage (new opoid use several days ago for pancreatitis). Past medical history comments: recent pancreatitis. .    Past Medical History:  Diagnosis Date  . Acute pancreatitis 10/25/2016  . Anemia 07/29/2012  . 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 Constipation 10/26/2016  . Epistaxis 09/30/2016  . GERD (gastroesophageal reflux disease)   . Headache 04/20/2014  . Hypertension   . Medicare annual wellness visit, subsequent 04/14/2014  . Obesity 10/20/2014  . Other and unspecified hyperlipidemia 04/14/2013  . Pancreatitis 10/22/2016  . Preventative health  care 07/29/2012  . Shingles 12/12/2012  . Sinusitis, acute 09/05/2012  . Tachycardia 10/14/2014  . Tick bite 11/19/2015    Patient Active Problem List   Diagnosis Date Noted  . Constipation 10/26/2016  . Acute pancreatitis 10/25/2016  . Epistaxis 09/30/2016  . 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 Hyperglycemia 06/05/2016  . Rash and nonspecific skin eruption 11/19/2015  . Foot swelling 08/10/2015  . Obesity 10/20/2014  . Headache 04/20/2014  . Medicare annual wellness visit, subsequent 04/14/2014  . Otitis media 10/20/2013  . Allergic state 10/20/2013  . Hyperlipidemia, mixed 04/14/2013  . Shingles 12/12/2012  . Chronic knee pain 11/21/2012  . TIA (transient ischemic attack) 09/05/2012  . Preventative health care 07/29/2012  . Anemia 07/29/2012  . Back pain 04/29/2012  . Essential hypertension 04/02/2012  . GERD (gastroesophageal reflux disease) 04/02/2012    Past Surgical History:  Procedure Laterality Date  . CHOLECYSTECTOMY         Home Medications    Prior to Admission medications   Medication Sig Start Date End Date Taking? Authorizing Provider  allopurinol (ZYLOPRIM) 100 MG tablet Take 1 tablet (100 mg total) by mouth daily. 10/25/16   Bradd Canary, MD  amLODipine (NORVASC) 5 MG tablet TAKE 1 TABLET (5 MG TOTAL) BY MOUTH DAILY. 06/16/16   Bradd Canary, MD  aspirin 81 MG tablet Take 81 mg by mouth 2 (two) times daily.    Historical Provider, MD  famotidine (PEPCID) 20 MG tablet Take 1 tablet (20 mg total) by mouth 2 (two) times daily. 10/22/16   Arby Barrette, MD  HYDROcodone-acetaminophen (NORCO/VICODIN) 5-325 MG tablet Take 1-2  tablets by mouth every 4 (four) hours as needed for moderate pain or severe pain. 10/22/16   Arby Barrette, MD  losartan (COZAAR) 50 MG tablet TAKE 1 TABLET (50 MG TOTAL) BY MOUTH DAILY. 05/12/16   Bradd Canary, MD  MAGNESIUM PO Take by mouth daily.    Historical Provider, MD  Misc Natural Products (OSTEO BI-FLEX ADV JOINT SHIELD PO) Take by mouth  daily.    Historical Provider, MD  Multiple Vitamins-Minerals (CENTRUM SILVER ADULT 50+ PO) Take by mouth daily.    Historical Provider, MD  mupirocin ointment (BACTROBAN) 2 % Place 1 application into the nose at bedtime. 09/26/16   Bradd Canary, MD  Probiotic Product (PROBIOTIC DAILY) CAPS Vear Clock Colon Health-Take 1 capsule by mouth daily.    Historical Provider, MD  sucralfate (CARAFATE) 1 GM/10ML suspension Take 10 mLs (1 g total) by mouth 4 (four) times daily -  with meals and at bedtime. 10/22/16   Arby Barrette, MD    Family History Family History  Problem Relation Age of Onset  . COPD Mother     worked in [employer], clots  . Peripheral vascular disease Mother   . Dementia Father   . Peripheral vascular disease Brother     Social History Social History  Substance Use Topics  . Smoking status: Never Smoker  . Smokeless tobacco: Never Used  . Alcohol use No     Allergies   Fish allergy and Propulsid [cisapride]   Review of Systems Review of Systems  Constitutional: Negative for chills, fatigue and fever.  HENT: Negative for congestion and rhinorrhea.   Respiratory: Negative for cough, chest tightness, shortness of breath, wheezing and stridor.   Cardiovascular: Negative for chest pain, palpitations and leg swelling.  Gastrointestinal: Positive for abdominal distention, abdominal pain, constipation, nausea and rectal pain. Negative for blood in stool, diarrhea, melena and vomiting. Flatus: deacreased.  Genitourinary: Negative for decreased urine volume, dysuria, flank pain, frequency, hematuria, testicular pain and urgency.  Musculoskeletal: Negative for back pain, neck pain and neck stiffness.  Skin: Negative for rash and wound.  Neurological: Negative for weakness.  Psychiatric/Behavioral: Negative for agitation and confusion.  All other systems reviewed and are negative.    Physical Exam Updated Vital Signs BP 139/81   Pulse 93   Temp 97.8 F (36.6 C)    Resp 20   Ht  (1.905 m)   Wt 228 lb (103.4 kg)   SpO2 100%   BMI 28.50 kg/m   Physical Exam  Constitutional: He is oriented to person, place, and time. He appears well-developed and well-nourished. No distress.  HENT:  Head: Normocephalic and atraumatic.  Mouth/Throat: Oropharynx is clear and moist. No oropharyngeal exudate.  Eyes: Conjunctivae and EOM are normal. Pupils are equal, round, and reactive to light.  Neck: Normal range of motion. Neck supple.  Cardiovascular: Normal rate, regular rhythm, normal heart sounds and intact distal pulses.   No murmur heard. Pulmonary/Chest: Effort normal and breath sounds normal. No stridor. No respiratory distress. He has no wheezes. He has no rales. He exhibits no tenderness.  Abdominal: Soft. Normal appearance and bowel sounds are normal. He exhibits distension. There is tenderness in the left lower quadrant. There is no rigidity and no CVA tenderness. No hernia.  Musculoskeletal: He exhibits no edema or tenderness.  Neurological: He is alert and oriented to person, place, and time. No sensory deficit. He exhibits normal muscle tone.  Skin: Skin is warm and dry. Capillary refill takes less  than 2 seconds. He is not diaphoretic. No erythema. No pallor.  Psychiatric: He has a normal mood and affect.  Nursing note and vitals reviewed.    ED Treatments / Results  Labs (all labs ordered are listed, but only abnormal results are displayed) Labs Reviewed  COMPREHENSIVE METABOLIC PANEL - Abnormal; Notable for the following:       Result Value   Glucose, Bld 118 (*)    Creatinine, Ser 1.48 (*)    Calcium 8.6 (*)    Albumin 3.4 (*)    Total Bilirubin 1.5 (*)    GFR calc non Af Amer 45 (*)    GFR calc Af Amer 52 (*)    All other components within normal limits  CBC WITH DIFFERENTIAL/PLATELET  LIPASE, BLOOD  PROTIME-INR  I-STAT CG4 LACTIC ACID, ED    EKG  EKG Interpretation None       Radiology Ct Abdomen Pelvis W  Contrast  Result Date: 10/27/2016 CLINICAL DATA:  Abdominal pain and constipation for 2 days. History of pancreatitis, cholecystectomy EXAM: CT ABDOMEN AND PELVIS WITH CONTRAST TECHNIQUE: Multidetector CT imaging of the abdomen and pelvis was performed using the standard protocol following bolus administration of intravenous contrast. CONTRAST:  80mL ISOVUE-300 IOPAMIDOL (ISOVUE-300) INJECTION 61% COMPARISON:  CT abdomen and pelvis October 22, 2016 FINDINGS: LOWER CHEST: Minimal dependent atelectasis. Included heart size is normal. No pericardial effusion. HEPATOBILIARY: Liver is normal.  Status post cholecystectomy. PANCREAS: Slight residual fat stranding about the pancreatic head without focal necrosis, pseudocyst, ductal dilatation or mass. SPLEEN: Normal. ADRENALS/URINARY TRACT: Kidneys are orthotopic, demonstrating symmetric enhancement. No nephrolithiasis, hydronephrosis or solid renal masses. Small bilateral parapelvic cysts. Stable 13 mm LEFT interpolar benign cyst. The unopacified ureters are normal in course and caliber. Delayed imaging through the kidneys demonstrates symmetric prompt contrast excretion within the proximal urinary collecting system. Urinary bladder is partially distended and unremarkable. Normal adrenal glands. STOMACH/BOWEL: 8.1 cm stool distended rectum. Moderate amount of retained large bowel stool with stool air levels. Mild sigmoid diverticulosis. The stomach, small and large bowel are normal in course and caliber without inflammatory changes. The appendix is not discretely identified, however there are no inflammatory changes in the right lower quadrant. VASCULAR/LYMPHATIC: Aortoiliac vessels are normal in course and caliber. No lymphadenopathy by CT size criteria. REPRODUCTIVE: Normal. OTHER: No intraperitoneal free fluid or free air. MUSCULOSKELETAL: Nonacute. 4.4 cm lumbosacral paraspinal sebaceous cyst. Small fat containing inguinal hernias. Small fat containing umbilical  hernia. Multilevel moderate to severe degenerative change of lumbar spine. IMPRESSION: Nearly resolved pancreatitis without complication. No acute intra-abdominal or pelvic process. Moderate retained large bowel stool with worsening stool distended rectum, no bowel obstruction. Electronically Signed   By: Awilda Metro M.D.   On: 10/27/2016 17:02    Procedures Procedures (including critical care time)  Medications Ordered in ED Medications  sodium chloride 0.9 % bolus 1,000 mL (0 mLs Intravenous Stopped 10/27/16 1839)  iopamidol (ISOVUE-300) 61 % injection 100 mL (80 mLs Intravenous Contrast Given 10/27/16 1646)     Initial Impression / Assessment and Plan / ED Course  I have reviewed the triage vital signs and the nursing notes.  Pertinent labs & imaging results that were available during my care of the patient were reviewed by me and considered in my medical decision making (see chart for details).     Anthony Poole is a 76 y.o. male With a past medical history significant for hypertension, GERD, recent pancreatitis and prior cholecystectomy who presents with abdominal  pain, nausea, constipation, abdominal distention, and decreased flatus. Patient reports that he had pancreatitis last week and was started on opioid pain medicine. He reports that his symptoms of upper abdominal pain improved but he is developing new complaints. He says he is having left lower quadrant abdominal pain and has not had a bowel movement in 6 days. He reports this is very abnormal for him.    He reports the abdominal pain is a nine out of 10 in severity, nonradiating, worsened with bowel movements draining. He has no rectal bleeding but is having some loose small amounts of water school. He reports attempting to take stool softeners at home without relief. He denies changes in urination, fevers, or chills. He has mild nausea but no vomiting. He denies other symptoms.  On exam, patient has tenderness across his  abdomen. Slight distention. Given history of cholecystectomy and recent pancreatitis, in the setting of his complaints, clinical concern for obstruction. Next paragraph patient given pain medicine, fluids, and workup was initiated. Patient had blood work and CT scan to look for obstruction.  CT scan showed no evidence of obstruction showed a large amount of stool birded in the rectum. It also showed significantly improving pancreatitis. Lipase was now normal. Other lab testing was reassuring aside from creatinine being slightly elevated.  While discussing manual disinfection, patient attempted to have a bowel movement. Patient was able to pass the large stool. Patient had immediate relief of his abdominal pain. Patient had a large amount of stool removed. Patient will be started on MiraLAX to prevent further constipation. Patient given instructions for rehydration. Patient will follow up with PCP and G.I. team for further constipation and abdominal pain management. Patient will need recheck of his creatinine.  Patient voiced understanding of this plan and felt much better. Patient discharged in good condition with resolution of abdominal pain. Patient understood return precautions.  Final Clinical Impressions(s) / ED Diagnoses   Final diagnoses:  Constipation, unspecified constipation type  Left lower quadrant pain    New Prescriptions Discharge Medication List as of 10/27/2016  7:16 PM    START taking these medications   Details  polyethylene glycol (MIRALAX) packet Take 17 g by mouth daily., Starting Thu 10/27/2016, Print       Clinical Impression: 1. Constipation, unspecified constipation type   2. Left lower quadrant pain     Disposition: Discharge  Condition: Good  I have discussed the results, Dx and Tx plan with the pt(& family if present). He/she/they expressed understanding and agree(s) with the plan. Discharge instructions discussed at great length. Strict return precautions  discussed and pt &/or family have verbalized understanding of the instructions. No further questions at time of discharge.    Discharge Medication List as of 10/27/2016  7:16 PM    START taking these medications   Details  polyethylene glycol (MIRALAX) packet Take 17 g by mouth daily., Starting Thu 10/27/2016, Print        Follow Up: Bradd Canary, MD [address] [place] [address] [phone]  Schedule an appointment as soon as possible for a visit    [facility] [place] [facility] [address] [id number] m[facility] [address] [place] [address] [phone]  If symptoms worsen     Heide Scales, MD 10/28/16 1434

## 2016-10-27 NOTE — ED Notes (Signed)
Pt returning from CT

## 2016-10-27 NOTE — ED Notes (Signed)
ED Provider at bedside. 

## 2016-10-27 NOTE — Discharge Instructions (Signed)
Please take the MiraLAX daily to help with your constipation. Please stay hydrated. Please schedule a follow-up appointment with your PCP in the next several days to reevaluate your kidney function and your abdominal pain. If any symptoms change or worsen, please return to the nearest emergency department.

## 2016-10-27 NOTE — ED Notes (Signed)
Pt verbalized understanding of discharge instructions and denies any further questions at this time.   

## 2016-10-27 NOTE — ED Notes (Signed)
Has drank prune and MOM, dulcolax, stool softener, used fleets enema.

## 2016-10-27 NOTE — ED Notes (Signed)
ED provider at bedside.

## 2016-10-27 NOTE — ED Triage Notes (Signed)
Pt c/o constipation  x6 days , unrelieved by OTC meds

## 2016-11-05 ENCOUNTER — Other Ambulatory Visit: Payer: Self-pay | Admitting: Family Medicine

## 2016-11-05 DIAGNOSIS — I10 Essential (primary) hypertension: Secondary | ICD-10-CM

## 2016-12-06 ENCOUNTER — Other Ambulatory Visit: Payer: Self-pay | Admitting: Family Medicine

## 2016-12-12 ENCOUNTER — Encounter: Payer: Self-pay | Admitting: Family Medicine

## 2016-12-12 ENCOUNTER — Ambulatory Visit (INDEPENDENT_AMBULATORY_CARE_PROVIDER_SITE_OTHER): Payer: Medicare Other | Admitting: Family Medicine

## 2016-12-12 DIAGNOSIS — M25561 Pain in right knee: Secondary | ICD-10-CM | POA: Diagnosis not present

## 2016-12-12 DIAGNOSIS — E6609 Other obesity due to excess calories: Secondary | ICD-10-CM | POA: Diagnosis not present

## 2016-12-12 DIAGNOSIS — E782 Mixed hyperlipidemia: Secondary | ICD-10-CM | POA: Diagnosis not present

## 2016-12-12 DIAGNOSIS — R739 Hyperglycemia, unspecified: Secondary | ICD-10-CM

## 2016-12-12 DIAGNOSIS — M109 Gout, unspecified: Secondary | ICD-10-CM

## 2016-12-12 DIAGNOSIS — M940 Chondrocostal junction syndrome [Tietze]: Secondary | ICD-10-CM | POA: Insufficient documentation

## 2016-12-12 DIAGNOSIS — I10 Essential (primary) hypertension: Secondary | ICD-10-CM

## 2016-12-12 HISTORY — DX: Chondrocostal junction syndrome (tietze): M94.0

## 2016-12-12 NOTE — Assessment & Plan Note (Signed)
No further flares. Is hydrating well, tolerating meds

## 2016-12-12 NOTE — Patient Instructions (Signed)
No Lab Work Today Lidocaine patch Costochondritis Costochondritis is swelling and irritation (inflammation) of the tissue (cartilage) that connects your ribs to your breastbone (sternum). This causes pain in the front of your chest. Usually, the pain:  Starts gradually.  Is in more than one rib.  This condition usually goes away on its own over time. Follow these instructions at home:  Do not do anything that makes your pain worse.  If directed, put ice on the painful area: ? Put ice in a plastic bag. ? Place a towel between your skin and the bag. ? Leave the ice on for 20 minutes, 2-3 times a day.  If directed, put heat on the affected area as often as told by your doctor. Use the heat source that your doctor tells you to use, such as a moist heat pack or a heating pad. ? Place a towel between your skin and the heat source. ? Leave the heat on for 20-30 minutes. ? Take off the heat if your skin turns bright red. This is very important if you cannot feel pain, heat, or cold. You may have a greater risk of getting burned.  Take over-the-counter and prescription medicines only as told by your doctor.  Return to your normal activities as told by your doctor. Ask your doctor what activities are safe for you.  Keep all follow-up visits as told by your doctor. This is important. Contact a doctor if:  You have chills or a fever.  Your pain does not go away or it gets worse.  You have a cough that does not go away. Get help right away if:  You are short of breath. This information is not intended to replace advice given to you by your health care provider. Make sure you discuss any questions you have with your health care provider. Document Released: 12/07/2007 Document Revised: 01/08/2016 Document Reviewed: 10/14/2015 Elsevier Interactive Patient Education  2018 [employer] ab Work today

## 2016-12-12 NOTE — Assessment & Plan Note (Signed)
minimize simple carbs. Increase exercise as tolerated.  

## 2016-12-12 NOTE — Assessment & Plan Note (Signed)
Improving if it flares again he will call for possible referral to sports med.

## 2016-12-12 NOTE — Assessment & Plan Note (Signed)
Encouraged DASH diet, decrease po intake and increase exercise as tolerated. Needs 7-8 hours of sleep nightly. Avoid trans fats, eat small, frequent meals every 4-5 hours with lean proteins, complex carbs and healthy fats. Minimize simple carbs, bariatric referral 

## 2016-12-12 NOTE — Assessment & Plan Note (Signed)
Encouraged heart healthy diet, increase exercise, avoid trans fats, consider a krill oil cap daily 

## 2016-12-12 NOTE — Assessment & Plan Note (Signed)
Well controlled, no changes to meds. Encouraged heart healthy diet such as the DASH diet and exercise as tolerated.  °

## 2016-12-12 NOTE — Assessment & Plan Note (Signed)
Sharp pain in right axillae over lower ribs after some lifting, is improving. Encouraged topical lidocaine prn

## 2016-12-12 NOTE — Progress Notes (Signed)
Subjective:  I acted as a Neurosurgeon for Dr. Abner Greenspan. [place], [place]  Patient ID: Anthony Poole, male    DOB: 29-Nov-1940, 76 y.o.   MRN: [id number]  Chief Complaint  Patient presents with  . Follow-up    HPI  Patient is in today for a 6 week follow up. Is feeling mostly well. No recent febrile illness. Notes ome occasional palpitations without associated symptoms such as CP or SOB. Notes some ongoing joint pain especially in knees. No recent trauma, injury or swelling. Denies CP/SOB/HA/congestion/fevers/GI or GU c/o. Taking meds as prescribed  Patient Care Team: Bradd Canary, MD as PCP - General (Family Medicine)   Past Medical History:  Diagnosis Date  . Acute pancreatitis 10/25/2016  . Anemia 07/29/2012  . 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 Constipation 10/26/2016  . Costochondritis 12/12/2016  . Epistaxis 09/30/2016  . GERD (gastroesophageal reflux disease)   . Headache 04/20/2014  . Hypertension   . Medicare annual wellness visit, subsequent 04/14/2014  . Obesity 10/20/2014  . Other and unspecified hyperlipidemia 04/14/2013  . Pancreatitis 10/22/2016  . Preventative health care 07/29/2012  . Shingles 12/12/2012  . Sinusitis, acute 09/05/2012  . Tachycardia 10/14/2014  . Tick bite 11/19/2015    Past Surgical History:  Procedure Laterality Date  . CHOLECYSTECTOMY      Family History  Problem Relation Age of Onset  . COPD Mother        worked in [employer], clots  . Peripheral vascular disease Mother   . Dementia Father   . Peripheral vascular disease Brother     Social History   Social History  . Marital status: Married    Spouse name: N/A  . Number of children: N/A  . Years of education: N/A   Occupational History  . Not on file.   Social History Main Topics  . Smoking status: Never Smoker  . Smokeless tobacco: Never Used  . Alcohol use No  . Drug use: No  . Sexual activity: No     Comment: lives with wife, no dietary restrictions,   no fish   Other Topics Concern  . Not on file   Social History Narrative  . No narrative on file    Outpatient Medications Prior to Visit  Medication Sig Dispense Refill  . allopurinol (ZYLOPRIM) 100 MG tablet Take 1 tablet (100 mg total) by mouth daily. 30 tablet 2  . amLODipine (NORVASC) 5 MG tablet TAKE 1 TABLET (5 MG TOTAL) BY MOUTH DAILY. 90 tablet 1  . aspirin 81 MG tablet Take 81 mg by mouth 2 (two) times daily.    Marland Kitchen losartan (COZAAR) 50 MG tablet TAKE 1 TABLET (50 MG TOTAL) BY MOUTH DAILY. 90 tablet 1  . MAGNESIUM PO Take by mouth daily.    . Misc Natural Products (OSTEO BI-FLEX ADV JOINT SHIELD PO) Take by mouth daily.    . Multiple Vitamins-Minerals (CENTRUM SILVER ADULT 50+ PO) Take by mouth daily.    . mupirocin ointment (BACTROBAN) 2 % Place 1 application into the nose at bedtime. 22 g 1  . Probiotic Product (PROBIOTIC DAILY) CAPS Vear Clock Colon Health-Take 1 capsule by mouth daily.    . famotidine (PEPCID) 20 MG tablet Take 1 tablet (20 mg total) by mouth 2 (two) times daily. 30 tablet 0  . HYDROcodone-acetaminophen (NORCO/VICODIN) 5-325 MG tablet Take 1-2 tablets by mouth every 4 (four) hours as needed for moderate pain or severe pain. 20 tablet 0  .  polyethylene glycol (MIRALAX) packet Take 17 g by mouth daily. 14 each 0  . sucralfate (CARAFATE) 1 GM/10ML suspension Take 10 mLs (1 g total) by mouth 4 (four) times daily -  with meals and at bedtime. 420 mL 0   No facility-administered medications prior to visit.     Allergies  Allergen Reactions  . Fish Allergy Itching  . Propulsid [Cisapride] Hives    Review of Systems  Constitutional: Negative for fever and malaise/fatigue.  HENT: Negative for congestion.   Eyes: Negative for blurred vision.  Respiratory: Negative for cough and shortness of breath.   Cardiovascular: Positive for palpitations. Negative for chest pain and leg swelling.  Gastrointestinal: Negative for vomiting.  Musculoskeletal: Positive for  joint pain. Negative for back pain.  Skin: Negative for rash.  Neurological: Negative for loss of consciousness and headaches.       Objective:    Physical Exam  Constitutional: He is oriented to person, place, and time. He appears well-developed and well-nourished. No distress.  HENT:  Head: Normocephalic and atraumatic.  Eyes: Conjunctivae are normal.  Neck: Normal range of motion. No thyromegaly present.  Cardiovascular: Normal rate and regular rhythm.   Pulmonary/Chest: Effort normal and breath sounds normal. He has no wheezes.  Abdominal: Soft. Bowel sounds are normal. There is no tenderness.  Musculoskeletal: Normal range of motion. He exhibits no edema, tenderness or deformity.  Neurological: He is alert and oriented to person, place, and time.  Skin: Skin is warm and dry. He is not diaphoretic.  Psychiatric: He has a normal mood and affect.    BP 122/80 (BP Location: Left Arm, Patient Position: Sitting, Cuff Size: Normal)   Pulse 66   Temp 97.8 F (36.6 C) (Oral)   Resp 18   Ht 6\' 3"  (1.905 m)   Wt 231 lb 6.4 oz (105 kg)   SpO2 98%   BMI 28.92 kg/m  Wt Readings from Last 3 Encounters:  12/12/16 231 lb 6.4 oz (105 kg)  10/27/16 228 lb (103.4 kg)  10/25/16 228 lb (103.4 kg)   BP Readings from Last 3 Encounters:  12/12/16 122/80  10/27/16 (!) 164/94  10/25/16 120/68     Immunization History  Administered Date(s) Administered  . Influenza Split 04/02/2012  . Influenza, High Dose Seasonal PF 04/12/2013, 05/23/2016  . Influenza,inj,Quad PF,36+ Mos 04/14/2014, 05/20/2015  . Pneumococcal Conjugate-13 04/14/2014  . Pneumococcal Polysaccharide-23 09/26/2016  . Tdap 10/06/2011  . Zoster 10/06/2011    Health Maintenance  Topic Date Due  . INFLUENZA VACCINE  02/01/2017  . TETANUS/TDAP  10/05/2021  . COLONOSCOPY  06/13/2026  . PNA vac Low Risk Adult  Completed    Lab Results  Component Value Date   WBC 8.9 10/27/2016   HGB 13.9 10/27/2016   HCT 40.1  10/27/2016   PLT 294 10/27/2016   GLUCOSE 118 (H) 10/27/2016   CHOL 167 05/23/2016   TRIG 76.0 05/23/2016   HDL 50.70 05/23/2016   LDLCALC 101 (H) 05/23/2016   ALT 28 10/27/2016   AST 27 10/27/2016   NA 136 10/27/2016   K 3.8 10/27/2016   CL 101 10/27/2016   CREATININE 1.48 (H) 10/27/2016   BUN 18 10/27/2016   CO2 24 10/27/2016   TSH 1.09 05/23/2016   PSA 0.47 04/14/2014   INR 1.13 10/27/2016   HGBA1C 5.5 05/23/2016    Lab Results  Component Value Date   TSH 1.09 05/23/2016   Lab Results  Component Value Date   WBC 8.9 10/27/2016  HGB 13.9 10/27/2016   HCT 40.1 10/27/2016   MCV 91.6 10/27/2016   PLT 294 10/27/2016   Lab Results  Component Value Date   NA 136 10/27/2016   K 3.8 10/27/2016   CO2 24 10/27/2016   GLUCOSE 118 (H) 10/27/2016   BUN 18 10/27/2016   CREATININE 1.48 (H) 10/27/2016   BILITOT 1.5 (H) 10/27/2016   ALKPHOS 75 10/27/2016   AST 27 10/27/2016   ALT 28 10/27/2016   PROT 7.0 10/27/2016   ALBUMIN 3.4 (L) 10/27/2016   CALCIUM 8.6 (L) 10/27/2016   ANIONGAP 11 10/27/2016   GFR 64.70 05/23/2016   Lab Results  Component Value Date   CHOL 167 05/23/2016   Lab Results  Component Value Date   HDL 50.70 05/23/2016   Lab Results  Component Value Date   LDLCALC 101 (H) 05/23/2016   Lab Results  Component Value Date   TRIG 76.0 05/23/2016   Lab Results  Component Value Date   CHOLHDL 3 05/23/2016   Lab Results  Component Value Date   HGBA1C 5.5 05/23/2016         Assessment & Plan:   Problem List Items Addressed This Visit    Essential hypertension    Well controlled, no changes to meds. Encouraged heart healthy diet such as the DASH diet and exercise as tolerated.       Right knee pain    Improving if it flares again he will call for possible referral to sports med.      Hyperlipidemia, mixed    Encouraged heart healthy diet, increase exercise, avoid trans fats, consider a krill oil cap daily      Obesity    Encouraged  DASH diet, decrease po intake and increase exercise as tolerated. Needs 7-8 hours of sleep nightly. Avoid trans fats, eat small, frequent meals every 4-5 hours with lean proteins, complex carbs and healthy fats. Minimize simple carbs, bariatric referral       Gout of foot    No further flares. Is hydrating well, tolerating meds      Hyperglycemia    minimize simple carbs. Increase exercise as tolerated.       Costochondritis    Sharp pain in right axillae over lower ribs after some lifting, is improving. Encouraged topical lidocaine prn         I have discontinued Mr. Tersigni's famotidine, sucralfate, HYDROcodone-acetaminophen, and polyethylene glycol. I am also having him maintain his Multiple Vitamins-Minerals (CENTRUM SILVER ADULT 50+ PO), Misc Natural Products (OSTEO BI-FLEX ADV JOINT SHIELD PO), MAGNESIUM PO, aspirin, PROBIOTIC DAILY, mupirocin ointment, allopurinol, losartan, and amLODipine.  No orders of the defined types were placed in this encounter.   CMA served as Neurosurgeonscribe during this visit. History, Physical and Plan performed by medical provider. Documentation and orders reviewed and attested to.  Danise EdgeStacey Mallery Harshman, MD

## 2017-01-08 DIAGNOSIS — H66009 Acute suppurative otitis media without spontaneous rupture of ear drum, unspecified ear: Secondary | ICD-10-CM | POA: Diagnosis not present

## 2017-01-26 ENCOUNTER — Ambulatory Visit: Payer: PRIVATE HEALTH INSURANCE | Admitting: Family Medicine

## 2017-01-29 ENCOUNTER — Other Ambulatory Visit: Payer: Self-pay | Admitting: Family Medicine

## 2017-03-16 ENCOUNTER — Ambulatory Visit (INDEPENDENT_AMBULATORY_CARE_PROVIDER_SITE_OTHER): Payer: Medicare Other | Admitting: Family Medicine

## 2017-03-16 ENCOUNTER — Encounter: Payer: Self-pay | Admitting: Family Medicine

## 2017-03-16 VITALS — BP 118/72 | HR 51 | Temp 97.8°F | Wt 233.0 lb

## 2017-03-16 DIAGNOSIS — R739 Hyperglycemia, unspecified: Secondary | ICD-10-CM | POA: Diagnosis not present

## 2017-03-16 DIAGNOSIS — M109 Gout, unspecified: Secondary | ICD-10-CM

## 2017-03-16 DIAGNOSIS — D649 Anemia, unspecified: Secondary | ICD-10-CM

## 2017-03-16 DIAGNOSIS — Z23 Encounter for immunization: Secondary | ICD-10-CM | POA: Diagnosis not present

## 2017-03-16 DIAGNOSIS — E782 Mixed hyperlipidemia: Secondary | ICD-10-CM | POA: Diagnosis not present

## 2017-03-16 DIAGNOSIS — I10 Essential (primary) hypertension: Secondary | ICD-10-CM

## 2017-03-16 HISTORY — DX: Hypocalcemia: E83.51

## 2017-03-16 LAB — LIPID PANEL
CHOLESTEROL: 169 mg/dL (ref 0–200)
HDL: 64.8 mg/dL (ref >39.00)
LDL CALC: 92 mg/dL (ref 0–99)
NonHDL: 104.57
Total CHOL/HDL Ratio: 3
Triglycerides: 62 mg/dL (ref 0.0–149.0)
VLDL: 12.4 mg/dL (ref 0.0–40.0)

## 2017-03-16 LAB — CBC
HCT: 43.4 % (ref 39.0–52.0)
Hemoglobin: 14.3 g/dL (ref 13.0–17.0)
MCHC: 32.9 g/dL (ref 30.0–36.0)
MCV: 97.7 fl (ref 78.0–100.0)
Platelets: 243 10*3/uL (ref 150.0–400.0)
RBC: 4.44 Mil/uL (ref 4.22–5.81)
RDW: 13.7 % (ref 11.5–15.5)
WBC: 4.4 10*3/uL (ref 4.0–10.5)

## 2017-03-16 LAB — COMPREHENSIVE METABOLIC PANEL
ALBUMIN: 3.8 g/dL (ref 3.5–5.2)
ALT: 16 U/L (ref 0–53)
AST: 18 U/L (ref 0–37)
Alkaline Phosphatase: 80 U/L (ref 39–117)
BUN: 13 mg/dL (ref 6–23)
CO2: 29 mEq/L (ref 19–32)
CREATININE: 1.28 mg/dL (ref 0.40–1.50)
Calcium: 8.9 mg/dL (ref 8.4–10.5)
Chloride: 106 mEq/L (ref 96–112)
GFR: 58.19 mL/min — ABNORMAL LOW (ref >60.00)
Glucose, Bld: 104 mg/dL — ABNORMAL HIGH (ref 70–99)
Potassium: 4.7 mEq/L (ref 3.5–5.1)
SODIUM: 140 meq/L (ref 135–145)
TOTAL PROTEIN: 6.8 g/dL (ref 6.0–8.3)
Total Bilirubin: 0.6 mg/dL (ref 0.2–1.2)

## 2017-03-16 LAB — URIC ACID: URIC ACID, SERUM: 5.5 mg/dL (ref 4.0–7.8)

## 2017-03-16 LAB — HEMOGLOBIN A1C: HEMOGLOBIN A1C: 5.6 % (ref 4.6–6.5)

## 2017-03-16 LAB — TSH: TSH: 1.5 u[IU]/mL (ref 0.35–4.50)

## 2017-03-16 LAB — VITAMIN D 25 HYDROXY (VIT D DEFICIENCY, FRACTURES): VITD: 60.11 ng/mL (ref 30.00–100.00)

## 2017-03-16 NOTE — Assessment & Plan Note (Signed)
hgba1c acceptable, minimize simple carbs. Increase exercise as tolerated.  

## 2017-03-16 NOTE — Assessment & Plan Note (Signed)
Recommend calcium intake of 1200 to 1500 mg daily, divided into roughly 3 doses. Best source is the diet and a single dairy serving is about 500 mg, a supplement of calcium citrate once or twice daily to balance diet is fine if not getting enough in diet. Also need Vitamin D 2000 IU caps, 1 cap daily if not already taking vitamin D. Also recommend weight baring exercise on hips and upper body to keep bones strong 

## 2017-03-16 NOTE — Patient Instructions (Addendum)
Recommend calcium intake of 1200 to 1500 mg daily, divided into roughly 3 doses. Best source is the diet and a single dairy serving is about 500 mg, a supplement of calcium citrate once or twice daily to balance diet is fine if not getting enough in diet. Also need Vitamin D 2000 IU caps, 1 cap daily if not already taking vitamin D. Also recommend weight baring exercise on hips and upper body to keep bones strong. Citracal once to twice daily  Shingrix new shingles shot 2 shots over six months at pharmacy  Hypertension Hypertension, commonly called high blood pressure, is when the force of blood pumping through the arteries is too strong. The arteries are the blood vessels that carry blood from the heart throughout the body. Hypertension forces the heart to work harder to pump blood and may cause arteries to become narrow or stiff. Having untreated or uncontrolled hypertension can cause heart attacks, strokes, kidney disease, and other problems. A blood pressure reading consists of a higher number over a lower number. Ideally, your blood pressure should be below 120/80. The first ("top") number is called the systolic pressure. It is a measure of the pressure in your arteries as your heart beats. The second ("bottom") number is called the diastolic pressure. It is a measure of the pressure in your arteries as the heart relaxes. What are the causes? The cause of this condition is not known. What increases the risk? Some risk factors for high blood pressure are under your control. Others are not. Factors you can change  Smoking.  Having type 2 diabetes mellitus, high cholesterol, or both.  Not getting enough exercise or physical activity.  Being overweight.  Having too much fat, sugar, calories, or salt (sodium) in your diet.  Drinking too much alcohol. Factors that are difficult or impossible to change  Having chronic kidney disease.  Having a family history of high blood pressure.  Age.  Risk increases with age.  Race. You may be at higher risk if you are African-American.  Gender. Men are at higher risk than women before age 84. After age 43, women are at higher risk than men.  Having obstructive sleep apnea.  Stress. What are the signs or symptoms? Extremely high blood pressure (hypertensive crisis) may cause:  Headache.  Anxiety.  Shortness of breath.  Nosebleed.  Nausea and vomiting.  Severe chest pain.  Jerky movements you cannot control (seizures).  How is this diagnosed? This condition is diagnosed by measuring your blood pressure while you are seated, with your arm resting on a surface. The cuff of the blood pressure monitor will be placed directly against the skin of your upper arm at the level of your heart. It should be measured at least twice using the same arm. Certain conditions can cause a difference in blood pressure between your right and left arms. Certain factors can cause blood pressure readings to be lower or higher than normal (elevated) for a short period of time:  When your blood pressure is higher when you are in a health care provider's office than when you are at home, this is called white coat hypertension. Most people with this condition do not need medicines.  When your blood pressure is higher at home than when you are in a health care provider's office, this is called masked hypertension. Most people with this condition may need medicines to control blood pressure.  If you have a high blood pressure reading during one visit or you have  normal blood pressure with other risk factors:  You may be asked to return on a different day to have your blood pressure checked again.  You may be asked to monitor your blood pressure at home for 1 week or longer.  If you are diagnosed with hypertension, you may have other blood or imaging tests to help your health care provider understand your overall risk for other conditions. How is this  treated? This condition is treated by making healthy lifestyle changes, such as eating healthy foods, exercising more, and reducing your alcohol intake. Your health care provider may prescribe medicine if lifestyle changes are not enough to get your blood pressure under control, and if:  Your systolic blood pressure is above 130.  Your diastolic blood pressure is above 80.  Your personal target blood pressure may vary depending on your medical conditions, your age, and other factors. Follow these instructions at home: Eating and drinking  Eat a diet that is high in fiber and potassium, and low in sodium, added sugar, and fat. An example eating plan is called the DASH (Dietary Approaches to Stop Hypertension) diet. To eat this way: ? Eat plenty of fresh fruits and vegetables. Try to fill half of your plate at each meal with fruits and vegetables. ? Eat whole grains, such as whole wheat pasta, brown rice, or whole grain bread. Fill about one quarter of your plate with whole grains. ? Eat or drink low-fat dairy products, such as skim milk or low-fat yogurt. ? Avoid fatty cuts of meat, processed or cured meats, and poultry with skin. Fill about one quarter of your plate with lean proteins, such as fish, chicken without skin, beans, eggs, and tofu. ? Avoid premade and processed foods. These tend to be higher in sodium, added sugar, and fat.  Reduce your daily sodium intake. Most people with hypertension should eat less than 1,500 mg of sodium a day.  Limit alcohol intake to no more than 1 drink a day for nonpregnant women and 2 drinks a day for men. One drink equals 12 oz of beer, 5 oz of wine, or 1 oz of hard liquor. Lifestyle  Work with your health care provider to maintain a healthy body weight or to lose weight. Ask what an ideal weight is for you.  Get at least 30 minutes of exercise that causes your heart to beat faster (aerobic exercise) most days of the week. Activities may include  walking, swimming, or biking.  Include exercise to strengthen your muscles (resistance exercise), such as pilates or lifting weights, as part of your weekly exercise routine. Try to do these types of exercises for 30 minutes at least 3 days a week.  Do not use any products that contain nicotine or tobacco, such as cigarettes and e-cigarettes. If you need help quitting, ask your health care provider.  Monitor your blood pressure at home as told by your health care provider.  Keep all follow-up visits as told by your health care provider. This is important. Medicines  Take over-the-counter and prescription medicines only as told by your health care provider. Follow directions carefully. Blood pressure medicines must be taken as prescribed.  Do not skip doses of blood pressure medicine. Doing this puts you at risk for problems and can make the medicine less effective.  Ask your health care provider about side effects or reactions to medicines that you should watch for. Contact a health care provider if:  You think you are having a reaction to  a medicine you are taking.  You have headaches that keep coming back (recurring).  You feel dizzy.  You have swelling in your ankles.  You have trouble with your vision. Get help right away if:  You develop a severe headache or confusion.  You have unusual weakness or numbness.  You feel faint.  You have severe pain in your chest or abdomen.  You vomit repeatedly.  You have trouble breathing. Summary  Hypertension is when the force of blood pumping through your arteries is too strong. If this condition is not controlled, it may put you at risk for serious complications.  Your personal target blood pressure may vary depending on your medical conditions, your age, and other factors. For most people, a normal blood pressure is less than 120/80.  Hypertension is treated with lifestyle changes, medicines, or a combination of both. Lifestyle  changes include weight loss, eating a healthy, low-sodium diet, exercising more, and limiting alcohol. This information is not intended to replace advice given to you by your health care provider. Make sure you discuss any questions you have with your health care provider. Document Released: 06/20/2005 Document Revised: 05/18/2016 Document Reviewed: 05/18/2016 [employer]vier Interactive Patient Education  [employer]2018 [employer]

## 2017-03-16 NOTE — Assessment & Plan Note (Signed)
Well controlled, no changes to meds. Encouraged heart healthy diet such as the DASH diet and exercise as tolerated.  °

## 2017-03-16 NOTE — Assessment & Plan Note (Signed)
Encouraged heart healthy diet, increase exercise, avoid trans fats, consider a krill oil cap daily 

## 2017-03-16 NOTE — Progress Notes (Signed)
Subjective:  I acted as a Neurosurgeon for Dr. Abner Greenspan. Anthony Poole, [place]  Patient ID: Anthony Poole, male    DOB: 1941-05-02, 76 y.o.   MRN: [id number]  No chief complaint on file.   HPI  Patient is in today for a 3 month follow up. Patient is following up on his HTN, hyperlipidemia and other medical concerns. No recent febrile illness or acute hospitalizations. Denies CP/palp/SOB/HA/congestion/fevers/GI or GU c/o. Taking meds as prescribed. No recent febrile illness or hospitalization. He continues to note mild memory impairment but not affecting his ADLs thus far   Patient Care Team: Bradd Canary, MD as PCP - General (Family Medicine)   Past Medical History:  Diagnosis Date  . Acute pancreatitis 10/25/2016  . Anemia 07/29/2012  . 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 Constipation 10/26/2016  . Costochondritis 12/12/2016  . Epistaxis 09/30/2016  . GERD (gastroesophageal reflux disease)   . Headache 04/20/2014  . Hypertension   . Medicare annual wellness visit, subsequent 04/14/2014  . Obesity 10/20/2014  . Other and unspecified hyperlipidemia 04/14/2013  . Pancreatitis 10/22/2016  . Preventative health care 07/29/2012  . Shingles 12/12/2012  . Sinusitis, acute 09/05/2012  . Tachycardia 10/14/2014  . Tick bite 11/19/2015    Past Surgical History:  Procedure Laterality Date  . CHOLECYSTECTOMY      Family History  Problem Relation Age of Onset  . COPD Mother        worked in [employer], clots  . Peripheral vascular disease Mother   . Dementia Father   . Peripheral vascular disease Brother     Social History   Social History  . Marital status: Married    Spouse name: N/A  . Number of children: N/A  . Years of education: N/A   Occupational History  . Not on file.   Social History Main Topics  . Smoking status: Never Smoker  . Smokeless tobacco: Never Used  . Alcohol use No  . Drug use: No  . Sexual activity: No     Comment: lives with wife, no  dietary restrictions,  no fish   Other Topics Concern  . Not on file   Social History Narrative  . No narrative on file    Outpatient Medications Prior to Visit  Medication Sig Dispense Refill  . allopurinol (ZYLOPRIM) 100 MG tablet TAKE 1 TABLET (100 MG TOTAL) BY MOUTH DAILY. 30 tablet 2  . amLODipine (NORVASC) 5 MG tablet TAKE 1 TABLET (5 MG TOTAL) BY MOUTH DAILY. 90 tablet 1  . aspirin 81 MG tablet Take 81 mg by mouth 2 (two) times daily.    Marland Kitchen losartan (COZAAR) 50 MG tablet TAKE 1 TABLET (50 MG TOTAL) BY MOUTH DAILY. 90 tablet 1  . MAGNESIUM PO Take by mouth daily.    . Misc Natural Products (OSTEO BI-FLEX ADV JOINT SHIELD PO) Take by mouth daily.    . Multiple Vitamins-Minerals (CENTRUM SILVER ADULT 50+ PO) Take by mouth daily.    . mupirocin ointment (BACTROBAN) 2 % Place 1 application into the nose at bedtime. 22 g 1  . Probiotic Product (PROBIOTIC DAILY) CAPS Vear Clock Colon Health-Take 1 capsule by mouth daily.     No facility-administered medications prior to visit.     Allergies  Allergen Reactions  . Fish Allergy Itching  . Propulsid [Cisapride] Hives    Review of Systems  Constitutional: Negative for fever and malaise/fatigue.  HENT: Negative for congestion.   Eyes: Negative  for blurred vision.  Respiratory: Negative for cough and shortness of breath.   Cardiovascular: Negative for chest pain, palpitations and leg swelling.  Gastrointestinal: Negative for vomiting.  Musculoskeletal: Negative for back pain.  Skin: Negative for rash.  Neurological: Negative for loss of consciousness and headaches.  Psychiatric/Behavioral: Positive for memory loss.       Objective:    Physical Exam  Constitutional: He is oriented to person, place, and time. He appears well-developed and well-nourished. No distress.  HENT:  Head: Normocephalic and atraumatic.  Eyes: Conjunctivae are normal.  Neck: Normal range of motion. No thyromegaly present.  Cardiovascular: Normal rate  and regular rhythm.   Pulmonary/Chest: Effort normal and breath sounds normal. He has no wheezes.  Abdominal: Soft. Bowel sounds are normal. There is no tenderness.  Musculoskeletal: Normal range of motion. He exhibits no edema or deformity.  Neurological: He is alert and oriented to person, place, and time.  Skin: Skin is warm and dry. He is not diaphoretic.  Psychiatric: He has a normal mood and affect.    BP 118/72 (BP Location: Left Arm, Patient Position: Sitting, Cuff Size: Normal)   Pulse (!) 51   Temp 97.8 F (36.6 C) (Oral)   Wt 233 lb (105.7 kg)   BMI 29.12 kg/m  Wt Readings from Last 3 Encounters:  03/16/17 233 lb (105.7 kg)  12/12/16 231 lb 6.4 oz (105 kg)  10/27/16 228 lb (103.4 kg)   BP Readings from Last 3 Encounters:  03/16/17 118/72  12/12/16 122/80  10/27/16 (!) 164/94     Immunization History  Administered Date(s) Administered  . Influenza Split 04/02/2012  . Influenza, High Dose Seasonal PF 04/12/2013, 05/23/2016  . Influenza,inj,Quad PF,6+ Mos 04/14/2014, 05/20/2015  . Pneumococcal Conjugate-13 04/14/2014  . Pneumococcal Polysaccharide-23 09/26/2016  . Tdap 10/06/2011  . Zoster 10/06/2011    Health Maintenance  Topic Date Due  . INFLUENZA VACCINE  02/01/2017  . TETANUS/TDAP  10/05/2021  . COLONOSCOPY  06/13/2026  . PNA vac Low Risk Adult  Completed    Lab Results  Component Value Date   WBC 8.9 10/27/2016   HGB 13.9 10/27/2016   HCT 40.1 10/27/2016   PLT 294 10/27/2016   GLUCOSE 118 (H) 10/27/2016   CHOL 167 05/23/2016   TRIG 76.0 05/23/2016   HDL 50.70 05/23/2016   LDLCALC 101 (H) 05/23/2016   ALT 28 10/27/2016   AST 27 10/27/2016   NA 136 10/27/2016   K 3.8 10/27/2016   CL 101 10/27/2016   CREATININE 1.48 (H) 10/27/2016   BUN 18 10/27/2016   CO2 24 10/27/2016   TSH 1.09 05/23/2016   PSA 0.47 04/14/2014   INR 1.13 10/27/2016   HGBA1C 5.5 05/23/2016    Lab Results  Component Value Date   TSH 1.09 05/23/2016   Lab Results    Component Value Date   WBC 8.9 10/27/2016   HGB 13.9 10/27/2016   HCT 40.1 10/27/2016   MCV 91.6 10/27/2016   PLT 294 10/27/2016   Lab Results  Component Value Date   NA 136 10/27/2016   K 3.8 10/27/2016   CO2 24 10/27/2016   GLUCOSE 118 (H) 10/27/2016   BUN 18 10/27/2016   CREATININE 1.48 (H) 10/27/2016   BILITOT 1.5 (H) 10/27/2016   ALKPHOS 75 10/27/2016   AST 27 10/27/2016   ALT 28 10/27/2016   PROT 7.0 10/27/2016   ALBUMIN 3.4 (L) 10/27/2016   CALCIUM 8.6 (L) 10/27/2016   ANIONGAP 11 10/27/2016   GFR  64.70 05/23/2016   Lab Results  Component Value Date   CHOL 167 05/23/2016   Lab Results  Component Value Date   HDL 50.70 05/23/2016   Lab Results  Component Value Date   LDLCALC 101 (H) 05/23/2016   Lab Results  Component Value Date   TRIG 76.0 05/23/2016   Lab Results  Component Value Date   CHOLHDL 3 05/23/2016   Lab Results  Component Value Date   HGBA1C 5.5 05/23/2016         Assessment & Plan:   Problem List Items Addressed This Visit    None      I am having Mr. Iyengar maintain his Multiple Vitamins-Minerals (CENTRUM SILVER ADULT 50+ PO), Misc Natural Products (OSTEO BI-FLEX ADV JOINT SHIELD PO), MAGNESIUM PO, aspirin, PROBIOTIC DAILY, mupirocin ointment, losartan, amLODipine, and allopurinol.  No orders of the defined types were placed in this encounter.   CMA served as Neurosurgeonscribe during this visit. History, Physical and Plan performed by medical provider. Documentation and orders reviewed and attested to.  Crissie SicklesPrincess Hilja Kintzel, [place]

## 2017-03-16 NOTE — Assessment & Plan Note (Signed)
Increase leafy greens, consider increased lean red meat and using cast iron cookware. Continue to monitor, report any concerns 

## 2017-03-16 NOTE — Assessment & Plan Note (Signed)
Check uric acid today 

## 2017-04-18 ENCOUNTER — Other Ambulatory Visit: Payer: Self-pay | Admitting: Family Medicine

## 2017-04-18 DIAGNOSIS — I10 Essential (primary) hypertension: Secondary | ICD-10-CM

## 2017-05-31 DIAGNOSIS — H66019 Acute suppurative otitis media with spontaneous rupture of ear drum, unspecified ear: Secondary | ICD-10-CM | POA: Diagnosis not present

## 2017-06-01 ENCOUNTER — Other Ambulatory Visit: Payer: Self-pay | Admitting: Family Medicine

## 2017-07-13 ENCOUNTER — Other Ambulatory Visit: Payer: Self-pay | Admitting: Family Medicine

## 2017-07-17 ENCOUNTER — Ambulatory Visit: Payer: Medicare Other | Admitting: Family Medicine

## 2017-10-04 ENCOUNTER — Other Ambulatory Visit: Payer: Self-pay | Admitting: Family Medicine

## 2017-10-13 ENCOUNTER — Other Ambulatory Visit: Payer: Self-pay | Admitting: Family Medicine

## 2017-10-13 DIAGNOSIS — I10 Essential (primary) hypertension: Secondary | ICD-10-CM

## 2017-10-27 ENCOUNTER — Other Ambulatory Visit: Payer: Self-pay | Admitting: Family Medicine

## 2017-11-20 ENCOUNTER — Other Ambulatory Visit: Payer: Self-pay | Admitting: Family Medicine

## 2017-11-23 ENCOUNTER — Other Ambulatory Visit: Payer: Self-pay | Admitting: Family Medicine

## 2017-12-27 ENCOUNTER — Other Ambulatory Visit: Payer: Self-pay

## 2017-12-27 MED ORDER — ALLOPURINOL 100 MG PO TABS: 100.0000 mg | ORAL_TABLET | Freq: Every day | ORAL | 1 refills | Status: DC

## 2018-01-18 ENCOUNTER — Other Ambulatory Visit: Payer: Self-pay | Admitting: Family Medicine

## 2018-02-02 DIAGNOSIS — M5430 Sciatica, unspecified side: Secondary | ICD-10-CM | POA: Diagnosis not present

## 2018-02-10 ENCOUNTER — Other Ambulatory Visit: Payer: Self-pay | Admitting: Family Medicine

## 2018-02-19 ENCOUNTER — Other Ambulatory Visit: Payer: Self-pay

## 2018-02-19 DIAGNOSIS — I10 Essential (primary) hypertension: Secondary | ICD-10-CM

## 2018-02-19 MED ORDER — AMLODIPINE BESYLATE 5 MG PO TABS: 5.0000 mg | ORAL_TABLET | Freq: Every day | ORAL | 0 refills | Status: DC

## 2018-02-19 NOTE — Telephone Encounter (Signed)
Rx request for amlodopine received from [facility]. Last refilled 11/22/17, #90, 0RF, with LOV: 03/16/17. Routed to Dr. Abner GreenspanBlyth to approve in light of last OV.

## 2018-02-19 NOTE — Telephone Encounter (Signed)
I can allow one last refill but he has to seen fo ant further refills cuz it will be over a year since he was

## 2018-02-20 NOTE — Telephone Encounter (Signed)
Author phoned pt. to notify him of need to make OV for further medication refills. Author spoke to Ryland Groupdonna, wife, who stated that she would let pt. Know to call to schedule.

## 2018-03-01 ENCOUNTER — Ambulatory Visit (HOSPITAL_BASED_OUTPATIENT_CLINIC_OR_DEPARTMENT_OTHER)
Admission: RE | Admit: 2018-03-01 | Discharge: 2018-03-01 | Disposition: A | Discharge status: Home / Self Care | Payer: Medicare Other | Source: Ambulatory Visit | Attending: Family Medicine | Admitting: Family Medicine

## 2018-03-01 ENCOUNTER — Ambulatory Visit (INDEPENDENT_AMBULATORY_CARE_PROVIDER_SITE_OTHER): Payer: Medicare Other | Admitting: Family Medicine

## 2018-03-01 VITALS — BP 130/70 | HR 63 | Temp 97.9°F | Resp 16 | Ht 77.0 in | Wt 242.6 lb

## 2018-03-01 DIAGNOSIS — Z23 Encounter for immunization: Secondary | ICD-10-CM

## 2018-03-01 DIAGNOSIS — M5441 Lumbago with sciatica, right side: Secondary | ICD-10-CM | POA: Insufficient documentation

## 2018-03-01 DIAGNOSIS — M545 Low back pain: Secondary | ICD-10-CM | POA: Diagnosis not present

## 2018-03-01 DIAGNOSIS — I10 Essential (primary) hypertension: Secondary | ICD-10-CM

## 2018-03-01 DIAGNOSIS — E782 Mixed hyperlipidemia: Secondary | ICD-10-CM | POA: Diagnosis not present

## 2018-03-01 DIAGNOSIS — R739 Hyperglycemia, unspecified: Secondary | ICD-10-CM | POA: Diagnosis not present

## 2018-03-01 DIAGNOSIS — M109 Gout, unspecified: Secondary | ICD-10-CM

## 2018-03-01 DIAGNOSIS — E6609 Other obesity due to excess calories: Secondary | ICD-10-CM | POA: Diagnosis not present

## 2018-03-01 DIAGNOSIS — M79606 Pain in leg, unspecified: Secondary | ICD-10-CM

## 2018-03-01 DIAGNOSIS — D649 Anemia, unspecified: Secondary | ICD-10-CM | POA: Diagnosis not present

## 2018-03-01 MED ORDER — TIZANIDINE COMFORT PAC 4 MG CO MISC: 1.0000 mg | Freq: Every evening | 2 refills | Status: DC | PRN

## 2018-03-01 NOTE — Patient Instructions (Signed)
Shingrix is the new shingles shot, 2 shots over 2-6 months at the pharmacy Gout Gout is painful swelling that can occur in some of your joints. Gout is a type of arthritis. This condition is caused by having too much uric acid in your body. Uric acid is a chemical that forms when your body breaks down substances called purines. Purines are important for building body proteins. When your body has too much uric acid, sharp crystals can form and build up inside your joints. This causes pain and swelling. Gout attacks can happen quickly and be very painful (acute gout). Over time, the attacks can affect more joints and become more frequent (chronic gout). Gout can also cause uric acid to build up under your skin and inside your kidneys. What are the causes? This condition is caused by too much uric acid in your blood. This can occur because:  Your kidneys do not remove enough uric acid from your blood. This is the most common cause.  Your body makes too much uric acid. This can occur with some cancers and cancer treatments. It can also occur if your body is breaking down too many red blood cells (hemolytic anemia).  You eat too many foods that are high in purines. These foods include organ meats and some seafood. Alcohol, especially beer, is also high in purines.  A gout attack may be triggered by trauma or stress. What increases the risk? This condition is more likely to develop in people who:  Have a family history of gout.  Are male and middle-aged.  Are male and have gone through menopause.  Are obese.  Frequently drink alcohol, especially beer.  Are dehydrated.  Lose weight too quickly.  Have an organ transplant.  Have lead poisoning.  Take certain medicines, including aspirin, cyclosporine, diuretics, levodopa, and niacin.  Have kidney disease or psoriasis.  What are the signs or symptoms? An attack of acute gout happens quickly. It usually occurs in just one joint. The  most common place is the big toe. Attacks often start at night. Other joints that may be affected include joints of the feet, ankle, knee, fingers, wrist, or elbow. Symptoms may include:  Severe pain.  Warmth.  Swelling.  Stiffness.  Tenderness. The affected joint may be very painful to touch.  Shiny, red, or purple skin.  Chills and fever.  Chronic gout may cause symptoms more frequently. More joints may be involved. You may also have white or yellow lumps (tophi) on your hands or feet or in other areas near your joints. How is this diagnosed? This condition is diagnosed based on your symptoms, medical history, and physical exam. You may have tests, such as:  Blood tests to measure uric acid levels.  Removal of joint fluid with a needle (aspiration) to look for uric acid crystals.  X-rays to look for joint damage.  How is this treated? Treatment for this condition has two phases: treating an acute attack and preventing future attacks. Acute gout treatment may include medicines to reduce pain and swelling, including:  NSAIDs.  Steroids. These are strong anti-inflammatory medicines that can be taken by mouth (orally) or injected into a joint.  Colchicine. This medicine relieves pain and swelling when it is taken soon after an attack. It can be given orally or through an IV tube.  Preventive treatment may include:  Daily use of smaller doses of NSAIDs or colchicine.  Use of a medicine that reduces uric acid levels in your blood.  Changes  to your diet. You may need to see a specialist about healthy eating (dietitian).  Follow these instructions at home: During a Gout Attack  If directed, apply ice to the affected area: ? Put ice in a plastic bag. ? Place a towel between your skin and the bag. ? Leave the ice on for 20 minutes, 2-3 times a day.  Rest the joint as much as possible. If the affected joint is in your leg, you may be given crutches to use.  Raise (elevate)  the affected joint above the level of your heart as often as possible.  Drink enough fluids to keep your urine clear or pale yellow.  Take over-the-counter and prescription medicines only as told by your health care provider.  Do not drive or operate heavy machinery while taking prescription pain medicine.  Follow instructions from your health care provider about eating or drinking restrictions.  Return to your normal activities as told by your health care provider. Ask your health care provider what activities are safe for you. Avoiding Future Gout Attacks  Follow a low-purine diet as told by your dietitian or health care provider. Avoid foods and drinks that are high in purines, including liver, kidney, anchovies, asparagus, herring, mushrooms, mussels, and beer.  Limit alcohol intake to no more than 1 drink a day for nonpregnant women and 2 drinks a day for men. One drink equals 12 oz of beer, 5 oz of wine, or 1 oz of hard liquor.  Maintain a healthy weight or lose weight if you are overweight. If you want to lose weight, talk with your health care provider. It is important that you do not lose weight too quickly.  Start or maintain an exercise program as told by your health care provider.  Drink enough fluids to keep your urine clear or pale yellow.  Take over-the-counter and prescription medicines only as told by your health care provider.  Keep all follow-up visits as told by your health care provider. This is important. Contact a health care provider if:  You have another gout attack.  You continue to have symptoms of a gout attack after10 days of treatment.  You have side effects from your medicines.  You have chills or a fever.  You have burning pain when you urinate.  You have pain in your lower back or belly. Get help right away if:  You have severe or uncontrolled pain.  You cannot urinate. This information is not intended to replace advice given to you by  your health care provider. Make sure you discuss any questions you have with your health care provider. Document Released: 06/17/2000 Document Revised: 11/26/2015 Document Reviewed: 04/02/2015 Elsevier Interactive Patient Education  [employer]2018 [employer] Inc[employer]

## 2018-03-02 LAB — COMPREHENSIVE METABOLIC PANEL
ALT: 22 U/L (ref 0–53)
AST: 23 U/L (ref 0–37)
Albumin: 4 g/dL (ref 3.5–5.2)
Alkaline Phosphatase: 74 U/L (ref 39–117)
BUN: 19 mg/dL (ref 6–23)
CHLORIDE: 104 meq/L (ref 96–112)
CO2: 27 meq/L (ref 19–32)
CREATININE: 1.26 mg/dL (ref 0.40–1.50)
Calcium: 8.9 mg/dL (ref 8.4–10.5)
GFR: 59.11 mL/min — ABNORMAL LOW (ref >60.00)
Glucose, Bld: 84 mg/dL (ref 70–99)
POTASSIUM: 4.2 meq/L (ref 3.5–5.1)
SODIUM: 139 meq/L (ref 135–145)
Total Bilirubin: 0.5 mg/dL (ref 0.2–1.2)
Total Protein: 7 g/dL (ref 6.0–8.3)

## 2018-03-02 LAB — LIPID PANEL
CHOL/HDL RATIO: 3
Cholesterol: 188 mg/dL (ref 0–200)
HDL: 62.4 mg/dL (ref >39.00)
LDL CALC: 114 mg/dL — AB (ref 0–99)
NONHDL: 125.15
Triglycerides: 55 mg/dL (ref 0.0–149.0)
VLDL: 11 mg/dL (ref 0.0–40.0)

## 2018-03-02 LAB — URIC ACID: Uric Acid, Serum: 5.1 mg/dL (ref 4.0–7.8)

## 2018-03-02 LAB — CBC
HCT: 40.5 % (ref 39.0–52.0)
Hemoglobin: 13.8 g/dL (ref 13.0–17.0)
MCHC: 34.1 g/dL (ref 30.0–36.0)
MCV: 96.6 fl (ref 78.0–100.0)
Platelets: 255 10*3/uL (ref 150.0–400.0)
RBC: 4.2 Mil/uL — ABNORMAL LOW (ref 4.22–5.81)
RDW: 13.5 % (ref 11.5–15.5)
WBC: 6.1 10*3/uL (ref 4.0–10.5)

## 2018-03-02 LAB — TSH: TSH: 2.46 u[IU]/mL (ref 0.35–4.50)

## 2018-03-02 LAB — HEMOGLOBIN A1C: Hgb A1c MFr Bld: 5.8 % (ref 4.6–6.5)

## 2018-03-05 NOTE — Assessment & Plan Note (Signed)
Well controlled, no changes to meds. Encouraged heart healthy diet such as the DASH diet and exercise as tolerated.  °

## 2018-03-05 NOTE — Progress Notes (Signed)
Subjective:    Patient ID: Anthony Poole, male    DOB: 07-24-1940, 77 y.o.   MRN: [id number]  Chief Complaint  Patient presents with  . Medication Refill    HPI Patient is in today for medication refills. He had been doing well until July 7 when he stated having increased low back pain with pain radiating down his right leg at times. No fall or trauma. He has gone to urgent care and tried chiropractic care both of which helped some but pain continue to recur. No fall or trauma. No fevers or chills. No incontinence. No polyuria or polydipsia. Denies CP/palp/SOB/HA/congestion/fevers/GI or GU c/o. Taking meds as prescribed  Past Medical History:  Diagnosis Date  . Acute pancreatitis 10/25/2016  . Anemia 07/29/2012  . 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 Constipation 10/26/2016  . Costochondritis 12/12/2016  . Epistaxis 09/30/2016  . GERD (gastroesophageal reflux disease)   . Headache 04/20/2014  . Hypertension   . Hypocalcemia 03/16/2017  . Medicare annual wellness visit, subsequent 04/14/2014  . Obesity 10/20/2014  . Other and unspecified hyperlipidemia 04/14/2013  . Pancreatitis 10/22/2016  . Preventative health care 07/29/2012  . Shingles 12/12/2012  . Sinusitis, acute 09/05/2012  . Tachycardia 10/14/2014  . Tick bite 11/19/2015    Past Surgical History:  Procedure Laterality Date  . CHOLECYSTECTOMY      Family History  Problem Relation Age of Onset  . COPD Mother        worked in [employer], clots  . Peripheral vascular disease Mother   . Dementia Father   . Peripheral vascular disease Brother     Social History   Socioeconomic History  . Marital status: Married    Spouse name: Not on file  . Number of children: Not on file  . Years of education: Not on file  . Highest education level: Not on file  Occupational History  . Not on file  Social Needs  . Financial resource strain: Not on file  . Food insecurity:    Worry: Not on file   Inability: Not on file  . Transportation needs:    Medical: Not on file    Non-medical: Not on file  Tobacco Use  . Smoking status: Never Smoker  . Smokeless tobacco: Never Used  Substance and Sexual Activity  . Alcohol use: No  . Drug use: No  . Sexual activity: Never    Comment: lives with wife, no dietary restrictions,  no fish  Lifestyle  . Physical activity:    Days per week: Not on file    Minutes per session: Not on file  . Stress: Not on file  Relationships  . Social connections:    Talks on phone: Not on file    Gets together: Not on file    Attends religious service: Not on file    Active member of club or organization: Not on file    Attends meetings of clubs or organizations: Not on file    Relationship status: Not on file  . Intimate partner violence:    Fear of current or ex partner: Not on file    Emotionally abused: Not on file    Physically abused: Not on file    Forced sexual activity: Not on file  Other Topics Concern  . Not on file  Social History Narrative  . Not on file    Outpatient Medications Prior to Visit  Medication Sig Dispense Refill  .  allopurinol (ZYLOPRIM) 100 MG tablet TAKE 1 TABLET BY MOUTH EVERY DAY 30 tablet 1  . amLODipine (NORVASC) 5 MG tablet Take 1 tablet (5 mg total) by mouth daily. 90 tablet 0  . aspirin 81 MG tablet Take 81 mg by mouth 2 (two) times daily.    Marland Kitchen losartan (COZAAR) 50 MG tablet TAKE 1 TABLET BY MOUTH EVERY DAY 90 tablet 1  . MAGNESIUM PO Take by mouth daily.    . Misc Natural Products (OSTEO BI-FLEX ADV JOINT SHIELD PO) Take by mouth daily.    . Multiple Vitamins-Minerals (CENTRUM SILVER ADULT 50+ PO) Take by mouth daily.    . mupirocin ointment (BACTROBAN) 2 % Place 1 application into the nose at bedtime. 22 g 1  . Probiotic Product (PROBIOTIC DAILY) CAPS Vear Clock Colon Health-Take 1 capsule by mouth daily.    Marland Kitchen allopurinol (ZYLOPRIM) 100 MG tablet TAKE 1 TABLET BY MOUTH EVERY DAY 30 tablet 0   No  facility-administered medications prior to visit.     Allergies  Allergen Reactions  . Fish Allergy Itching  . Propulsid [Cisapride] Hives    Review of Systems  Constitutional: Negative for fever and malaise/fatigue.  HENT: Negative for congestion.   Eyes: Negative for blurred vision.  Respiratory: Negative for shortness of breath.   Cardiovascular: Negative for chest pain, palpitations and leg swelling.  Gastrointestinal: Negative for abdominal pain, blood in stool and nausea.  Genitourinary: Negative for dysuria and frequency.  Musculoskeletal: Positive for back pain. Negative for falls.  Skin: Negative for rash.  Neurological: Negative for dizziness, loss of consciousness and headaches.  Endo/Heme/Allergies: Negative for environmental allergies.  Psychiatric/Behavioral: Negative for depression. The patient is not nervous/anxious.        Objective:    Physical Exam  Constitutional: He is oriented to person, place, and time. He appears well-developed and well-nourished. No distress.  HENT:  Head: Normocephalic and atraumatic.  Nose: Nose normal.  Eyes: Right eye exhibits no discharge. Left eye exhibits no discharge.  Neck: Normal range of motion. Neck supple.  Cardiovascular: Normal rate and regular rhythm.  No murmur heard. Pulmonary/Chest: Effort normal and breath sounds normal.  Abdominal: Soft. Bowel sounds are normal. There is no tenderness.  Musculoskeletal: He exhibits no edema.  Neurological: He is alert and oriented to person, place, and time.  Skin: Skin is warm and dry.  Psychiatric: He has a normal mood and affect.  Nursing note and vitals reviewed.   BP 130/70   Pulse 63   Temp 97.9 F (36.6 C) (Oral)   Resp 16   Ht 6\' 5"  (1.956 m)   Wt 242 lb 9.6 oz (110 kg)   SpO2 97%   BMI 28.77 kg/m  Wt Readings from Last 3 Encounters:  03/01/18 242 lb 9.6 oz (110 kg)  03/16/17 233 lb (105.7 kg)  12/12/16 231 lb 6.4 oz (105 kg)     Lab Results    Component Value Date   WBC 6.1 03/01/2018   HGB 13.8 03/01/2018   HCT 40.5 03/01/2018   PLT 255.0 03/01/2018   GLUCOSE 84 03/01/2018   CHOL 188 03/01/2018   TRIG 55.0 03/01/2018   HDL 62.40 03/01/2018   LDLCALC 114 (H) 03/01/2018   ALT 22 03/01/2018   AST 23 03/01/2018   NA 139 03/01/2018   K 4.2 03/01/2018   CL 104 03/01/2018   CREATININE 1.26 03/01/2018   BUN 19 03/01/2018   CO2 27 03/01/2018   TSH 2.46 03/01/2018   PSA  0.47 04/14/2014   INR 1.13 10/27/2016   HGBA1C 5.8 03/01/2018    Lab Results  Component Value Date   TSH 2.46 03/01/2018   Lab Results  Component Value Date   WBC 6.1 03/01/2018   HGB 13.8 03/01/2018   HCT 40.5 03/01/2018   MCV 96.6 03/01/2018   PLT 255.0 03/01/2018   Lab Results  Component Value Date   NA 139 03/01/2018   K 4.2 03/01/2018   CO2 27 03/01/2018   GLUCOSE 84 03/01/2018   BUN 19 03/01/2018   CREATININE 1.26 03/01/2018   BILITOT 0.5 03/01/2018   ALKPHOS 74 03/01/2018   AST 23 03/01/2018   ALT 22 03/01/2018   PROT 7.0 03/01/2018   ALBUMIN 4.0 03/01/2018   CALCIUM 8.9 03/01/2018   ANIONGAP 11 10/27/2016   GFR 59.11 (L) 03/01/2018   Lab Results  Component Value Date   CHOL 188 03/01/2018   Lab Results  Component Value Date   HDL 62.40 03/01/2018   Lab Results  Component Value Date   LDLCALC 114 (H) 03/01/2018   Lab Results  Component Value Date   TRIG 55.0 03/01/2018   Lab Results  Component Value Date   CHOLHDL 3 03/01/2018   Lab Results  Component Value Date   HGBA1C 5.8 03/01/2018       Assessment & Plan:   Problem List Items Addressed This Visit    Essential hypertension - Primary    Well controlled, no changes to meds. Encouraged heart healthy diet such as the DASH diet and exercise as tolerated.       Relevant Orders   CBC (Completed)   Comprehensive metabolic panel (Completed)   TSH (Completed)   Low back pain radiating down leg    Has been struggling off and on all summer, no fall or  trauma but has been seen by urgent care and chiropractor and while pain impoves it returns. Will proceed with xray and given Tizanidine to try and if no improvement he will need referral to specialty care.       Relevant Medications   tiZANidine-Liniment (TIZANIDINE COMFORT PAC) 4 MG MISC   Anemia   Relevant Orders   CBC (Completed)   Hyperlipidemia, mixed    Encouraged heart healthy diet, increase exercise, avoid trans fats, consider a krill oil cap daily      Relevant Orders   Lipid panel (Completed)   Obesity    Encouraged DASH diet, decrease po intake and increase exercise as tolerated. Needs 7-8 hours of sleep nightly. Avoid trans fats, eat small, frequent meals every 4-5 hours with lean proteins, complex carbs and healthy fats. Minimize simple carbs      Gout of foot    No recent flare since starting Allopurinol. Continue ot hydrate well      Relevant Medications   tiZANidine-Liniment (TIZANIDINE COMFORT PAC) 4 MG MISC   Other Relevant Orders   Uric acid (Completed)   Hyperglycemia    hgba1c acceptable, minimize simple carbs. Increase exercise as tolerate      Relevant Orders   Hemoglobin A1c (Completed)    Other Visit Diagnoses    Need for influenza vaccination       Relevant Orders   Flu vaccine HIGH DOSE PF (Fluzone High dose) (Completed)      I am having Agustus L. Manganiello start on TIZANIDINE COMFORT PAC. I am also having him maintain his Multiple Vitamins-Minerals (CENTRUM SILVER ADULT 50+ PO), Misc Natural Products (OSTEO BI-FLEX ADV JOINT SHIELD PO),  MAGNESIUM PO, aspirin, PROBIOTIC DAILY, mupirocin ointment, losartan, allopurinol, and amLODipine.  Meds ordered this encounter  Medications  . tiZANidine-Liniment (TIZANIDINE COMFORT PAC) 4 MG MISC    Sig: 1-4 mg by Combination route at bedtime as needed.    Dispense:  40 each    Refill:  2     [name], MD

## 2018-03-05 NOTE — Assessment & Plan Note (Signed)
Encouraged heart healthy diet, increase exercise, avoid trans fats, consider a krill oil cap daily 

## 2018-03-05 NOTE — Assessment & Plan Note (Signed)
Has been struggling off and on all summer, no fall or trauma but has been seen by urgent care and chiropractor and while pain impoves it returns. Will proceed with xray and given Tizanidine to try and if no improvement he will need referral to specialty care.

## 2018-03-05 NOTE — Assessment & Plan Note (Signed)
No recent flare since starting Allopurinol. Continue ot hydrate well

## 2018-03-05 NOTE — Assessment & Plan Note (Signed)
Encouraged DASH diet, decrease po intake and increase exercise as tolerated. Needs 7-8 hours of sleep nightly. Avoid trans fats, eat small, frequent meals every 4-5 hours with lean proteins, complex carbs and healthy fats. Minimize simple carbs 

## 2018-03-05 NOTE — Assessment & Plan Note (Signed)
hgba1c acceptable, minimize simple carbs. Increase exercise as tolerate 

## 2018-03-07 ENCOUNTER — Other Ambulatory Visit: Payer: Self-pay | Admitting: Family Medicine

## 2018-04-06 ENCOUNTER — Other Ambulatory Visit: Payer: Self-pay | Admitting: Family Medicine

## 2018-04-06 DIAGNOSIS — I10 Essential (primary) hypertension: Secondary | ICD-10-CM

## 2018-05-13 ENCOUNTER — Other Ambulatory Visit: Payer: Self-pay | Admitting: Family Medicine

## 2018-05-13 DIAGNOSIS — I10 Essential (primary) hypertension: Secondary | ICD-10-CM

## 2018-05-22 ENCOUNTER — Other Ambulatory Visit: Payer: Self-pay

## 2018-05-28 ENCOUNTER — Ambulatory Visit (INDEPENDENT_AMBULATORY_CARE_PROVIDER_SITE_OTHER): Payer: Medicare Other | Admitting: Family Medicine

## 2018-05-28 DIAGNOSIS — R739 Hyperglycemia, unspecified: Secondary | ICD-10-CM

## 2018-05-28 DIAGNOSIS — M109 Gout, unspecified: Secondary | ICD-10-CM | POA: Diagnosis not present

## 2018-05-28 DIAGNOSIS — I10 Essential (primary) hypertension: Secondary | ICD-10-CM | POA: Diagnosis not present

## 2018-05-28 DIAGNOSIS — E782 Mixed hyperlipidemia: Secondary | ICD-10-CM | POA: Diagnosis not present

## 2018-05-28 NOTE — Patient Instructions (Addendum)
Cetirizine/Zyrtec 10 mg twice daily and Famotidine/Pepcid 20 mg twice daily for a couple weeks if you have any allergic  Can start Prevagen 1 month prior to next visit Mild Neurocognitive Disorder Mild neurocognitive disorder (formerly known as mild cognitive impairment) is a mental disorder. It is a slight abnormal decrease in mental function. The areas of mental function affected may include memory, thought, communication, behavior, and completion of tasks. The decrease is noticeable and measurable but for the most part does not interfere with your daily activities. Mild neurocognitive disorder typically occurs in people older than 60 years but can occur earlier. It is not as serious as major neurocognitive disorder (formerly known as dementia) but may lead to a more serious neurocognitive disorder. However, in some cases the condition does not get worse. A few people with this disorder even improve. What are the causes? There are a number of different causes of mild neurocognitive disorder:  Brain disorders associated with abnormal protein deposits, such as Alzheimer's disease, Pick's disease, and Lewy body disease.  Brain disorders associated with abnormal movement, such as Parkinson's disease and Huntington's disease.  Diseases affecting blood vessels in the brain and resulting in mini-strokes.  Certain infections, such as human immunodeficiency virus (HIV) infection.  Traumatic brain injury.  Other medical conditions such as brain tumors, underactive thyroid (hypothyroidism), and vitamin B12 deficiency.  Use of certain prescription medicine and "recreational" drugs.  What are the signs or symptoms? Symptoms of mild neurocognitive disorder include:  Difficulty remembering. You may forget details of recent events, names, or phone numbers. You may forget important social events and appointments or repeatedly forget where you put your car keys.  Difficulty thinking and solving problems.  You may have trouble with complex tasks such as paying bills or driving in unfamiliar locations.  Difficulty communicating. You may have trouble finding the right word, naming an object, forming a sentence that makes sense, or understanding what you read or hear.  Changes in your behavior or personality. You may lose interest in the things that you used to enjoy or withdraw from social situations. You may get angry more easily than usual. You may act before thinking. You may do things in public that you would not usually do. You may hear or see things that are not real (hallucinations). You may believe falsely that others are trying to hurt you (paranoia).  How is this diagnosed? Mild neurocognitive disorder is diagnosed through an assessment by your health care provider. Your health care provider will ask you and your family, friends, or coworkers questions about your symptoms. He or she will ask how often the symptoms occur, how long they have been occurring, whether they are getting worse, and the effect they are having on your life. Your health care provider may refer you to a neurologist or mental health specialist for a detailed evaluation of your mental functions (neuropsychological testing). To identify the cause of your mild neurocognitive disorder, your health care provider may:  Obtain a detailed medical history.  Ask about alcohol and drug use, including prescription medicine.  Perform a physical exam.  Order blood tests and brain imaging exams.  How is this treated? Mild neurocognitive disorder caused by infections, use of certain medicines or "recreational" drugs, and certain medical conditions may improve with treatment of the condition that is causing the disorder. Mild neurocognitive disorder resulting from other causes generally does not improve and may worsen. In these cases, the goal of treatment is to slow progression of the  disorder and help you cope with the loss of mental  function. Treatments in these cases include:  Medicine. Medicine helps mainly with memory loss and behavioral symptoms.  Talk therapy. Talk therapy provides education, emotional support, memory aids, and other ways of making up for decreases in mental function.  Lifestyle changes. These include regular exercise, a healthy diet (including essential omega-3 fatty acids), intellectual stimulation, and increased social interaction.  This information is not intended to replace advice given to you by your health care provider. Make sure you discuss any questions you have with your health care provider. Document Released: 02/20/2013 Document Revised: 11/26/2015 Document Reviewed: 11/12/2012 Elsevier Interactive Patient Education  2017 [employer] reaction

## 2018-06-04 NOTE — Assessment & Plan Note (Signed)
Encouraged heart healthy diet, increase exercise, avoid trans fats, consider a krill oil cap daily 

## 2018-06-04 NOTE — Assessment & Plan Note (Signed)
Well controlled, no changes to meds. Encouraged heart healthy diet such as the DASH diet and exercise as tolerated.  °

## 2018-06-04 NOTE — Assessment & Plan Note (Signed)
No recent flare, hydrate well and monitor

## 2018-06-04 NOTE — Assessment & Plan Note (Signed)
hgba1c acceptable, minimize simple carbs. Increase exercise as tolerated.  

## 2018-06-04 NOTE — Progress Notes (Signed)
Subjective:    Patient ID: Anthony Poole, male    DOB: 10/17/1940, 77 y.o.   MRN: [id number]  Chief Complaint  Patient presents with  . Follow-up    HPI Patient is in today for follow-up.  He feels well today.  No recent hospitalization or febrile illness.  He denies any acute complaints.  No recent gout flare.  No polyuria or polydipsia.  Is trying to minimize carbohydrates in the diet and stay active. Denies CP/palp/SOB/HA/congestion/fevers/GI or GU c/o. Taking meds as prescribed  Past Medical History:  Diagnosis Date  . Acute pancreatitis 10/25/2016  . Anemia 07/29/2012  . 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 Constipation 10/26/2016  . Costochondritis 12/12/2016  . Epistaxis 09/30/2016  . GERD (gastroesophageal reflux disease)   . Headache 04/20/2014  . Hypertension   . Hypocalcemia 03/16/2017  . Medicare annual wellness visit, subsequent 04/14/2014  . Obesity 10/20/2014  . Other and unspecified hyperlipidemia 04/14/2013  . Pancreatitis 10/22/2016  . Preventative health care 07/29/2012  . Shingles 12/12/2012  . Sinusitis, acute 09/05/2012  . Tachycardia 10/14/2014  . Tick bite 11/19/2015    Past Surgical History:  Procedure Laterality Date  . CHOLECYSTECTOMY      Family History  Problem Relation Age of Onset  . COPD Mother        worked in [employer], clots  . Peripheral vascular disease Mother   . Dementia Father   . Peripheral vascular disease Brother     Social History   Socioeconomic History  . Marital status: Married    Spouse name: Not on file  . Number of children: Not on file  . Years of education: Not on file  . Highest education level: Not on file  Occupational History  . Not on file  Social Needs  . Financial resource strain: Not on file  . Food insecurity:    Worry: Not on file    Inability: Not on file  . Transportation needs:    Medical: Not on file    Non-medical: Not on file  Tobacco Use  . Smoking status: Never  Smoker  . Smokeless tobacco: Never Used  Substance and Sexual Activity  . Alcohol use: No  . Drug use: No  . Sexual activity: Never    Comment: lives with wife, no dietary restrictions,  no fish  Lifestyle  . Physical activity:    Days per week: Not on file    Minutes per session: Not on file  . Stress: Not on file  Relationships  . Social connections:    Talks on phone: Not on file    Gets together: Not on file    Attends religious service: Not on file    Active member of club or organization: Not on file    Attends meetings of clubs or organizations: Not on file    Relationship status: Not on file  . Intimate partner violence:    Fear of current or ex partner: Not on file    Emotionally abused: Not on file    Physically abused: Not on file    Forced sexual activity: Not on file  Other Topics Concern  . Not on file  Social History Narrative  . Not on file    Outpatient Medications Prior to Visit  Medication Sig Dispense Refill  . allopurinol (ZYLOPRIM) 100 MG tablet TAKE 1 TABLET BY MOUTH EVERY DAY 30 tablet 1  . amLODipine (NORVASC) 5 MG tablet TAKE  1 TABLET BY MOUTH EVERY DAY 90 tablet 1  . aspirin 81 MG tablet Take 81 mg by mouth 2 (two) times daily.    Marland Kitchen losartan (COZAAR) 50 MG tablet TAKE 1 TABLET BY MOUTH EVERY DAY 90 tablet 1  . MAGNESIUM PO Take by mouth daily.    . Misc Natural Products (OSTEO BI-FLEX ADV JOINT SHIELD PO) Take by mouth daily.    . Multiple Vitamins-Minerals (CENTRUM SILVER ADULT 50+ PO) Take by mouth daily.    . mupirocin ointment (BACTROBAN) 2 % Place 1 application into the nose at bedtime. 22 g 1  . Probiotic Product (PROBIOTIC DAILY) CAPS Vear Clock Colon Health-Take 1 capsule by mouth daily.    Marland Kitchen tiZANidine-Liniment (TIZANIDINE COMFORT PAC) 4 MG MISC 1-4 mg by Combination route at bedtime as needed. 40 each 2  . allopurinol (ZYLOPRIM) 100 MG tablet TAKE 1 TABLET BY MOUTH EVERY DAY (Patient not taking: Reported on 05/28/2018) 30 tablet 1   No  facility-administered medications prior to visit.     Allergies  Allergen Reactions  . Fish Allergy Itching  . Propulsid [Cisapride] Hives    Review of Systems  Constitutional: Negative for fever and malaise/fatigue.  HENT: Negative for congestion.   Eyes: Negative for blurred vision.  Respiratory: Negative for shortness of breath.   Cardiovascular: Negative for chest pain, palpitations and leg swelling.  Gastrointestinal: Negative for abdominal pain, blood in stool and nausea.  Genitourinary: Negative for dysuria and frequency.  Musculoskeletal: Negative for falls.  Skin: Negative for rash.  Neurological: Negative for dizziness, loss of consciousness and headaches.  Endo/Heme/Allergies: Negative for environmental allergies.  Psychiatric/Behavioral: Negative for depression. The patient is not nervous/anxious.        Objective:    Physical Exam  Constitutional: He is oriented to person, place, and time. He appears well-developed and well-nourished. No distress.  HENT:  Head: Normocephalic and atraumatic.  Nose: Nose normal.  Eyes: Right eye exhibits no discharge. Left eye exhibits no discharge.  Neck: Normal range of motion. Neck supple.  Cardiovascular: Normal rate and regular rhythm.  No murmur heard. Pulmonary/Chest: Effort normal and breath sounds normal.  Abdominal: Soft. Bowel sounds are normal. There is no tenderness.  Musculoskeletal: He exhibits no edema.  Neurological: He is alert and oriented to person, place, and time.  Skin: Skin is warm and dry.  Psychiatric: He has a normal mood and affect.  Nursing note and vitals reviewed.   BP 120/60 (BP Location: Left Arm, Patient Position: Sitting, Cuff Size: Large)   Pulse 66   Temp 98 F (36.7 C) (Oral)   Ht 6\' 3"  (1.905 m)   Wt 241 lb (109.3 kg)   SpO2 99%   BMI 30.12 kg/m  Wt Readings from Last 3 Encounters:  05/28/18 241 lb (109.3 kg)  03/01/18 242 lb 9.6 oz (110 kg)  03/16/17 233 lb (105.7 kg)      Lab Results  Component Value Date   WBC 6.1 03/01/2018   HGB 13.8 03/01/2018   HCT 40.5 03/01/2018   PLT 255.0 03/01/2018   GLUCOSE 84 03/01/2018   CHOL 188 03/01/2018   TRIG 55.0 03/01/2018   HDL 62.40 03/01/2018   LDLCALC 114 (H) 03/01/2018   ALT 22 03/01/2018   AST 23 03/01/2018   NA 139 03/01/2018   K 4.2 03/01/2018   CL 104 03/01/2018   CREATININE 1.26 03/01/2018   BUN 19 03/01/2018   CO2 27 03/01/2018   TSH 2.46 03/01/2018   PSA 0.47  04/14/2014   INR 1.13 10/27/2016   HGBA1C 5.8 03/01/2018    Lab Results  Component Value Date   TSH 2.46 03/01/2018   Lab Results  Component Value Date   WBC 6.1 03/01/2018   HGB 13.8 03/01/2018   HCT 40.5 03/01/2018   MCV 96.6 03/01/2018   PLT 255.0 03/01/2018   Lab Results  Component Value Date   NA 139 03/01/2018   K 4.2 03/01/2018   CO2 27 03/01/2018   GLUCOSE 84 03/01/2018   BUN 19 03/01/2018   CREATININE 1.26 03/01/2018   BILITOT 0.5 03/01/2018   ALKPHOS 74 03/01/2018   AST 23 03/01/2018   ALT 22 03/01/2018   PROT 7.0 03/01/2018   ALBUMIN 4.0 03/01/2018   CALCIUM 8.9 03/01/2018   ANIONGAP 11 10/27/2016   GFR 59.11 (L) 03/01/2018   Lab Results  Component Value Date   CHOL 188 03/01/2018   Lab Results  Component Value Date   HDL 62.40 03/01/2018   Lab Results  Component Value Date   LDLCALC 114 (H) 03/01/2018   Lab Results  Component Value Date   TRIG 55.0 03/01/2018   Lab Results  Component Value Date   CHOLHDL 3 03/01/2018   Lab Results  Component Value Date   HGBA1C 5.8 03/01/2018       Assessment & Plan:   Problem List Items Addressed This Visit    Essential hypertension    Well controlled, no changes to meds. Encouraged heart healthy diet such as the DASH diet and exercise as tolerated.       Hyperlipidemia, mixed    Encouraged heart healthy diet, increase exercise, avoid trans fats, consider a krill oil cap daily      Gout of foot    No recent flare, hydrate well and  monitor      Hyperglycemia    hgba1c acceptable, minimize simple carbs. Increase exercise as tolerated.          I am having Anthony Poole maintain his Multiple Vitamins-Minerals (CENTRUM SILVER ADULT 50+ PO), Misc Natural Products (OSTEO BI-FLEX ADV JOINT SHIELD PO), MAGNESIUM PO, aspirin, PROBIOTIC DAILY, mupirocin ointment, allopurinol, TIZANIDINE COMFORT PAC, losartan, and amLODipine.  No orders of the defined types were placed in this encounter.    Danise EdgeStacey Blyth, MD

## 2018-06-06 IMAGING — CT CT ABD-PELV W/ CM
2 of 5 series · 16 of 46 positions shown, 18 images · IV contrast (APPLIED)
Comparison: None.

CLINICAL DATA: Pt with epigastric abdominal pain x several days,
worse today, nausea, denies v/d or constipation, denies
feverElevated lipase, normal wbc

EXAM:
CT ABDOMEN AND PELVIS WITH CONTRAST
TECHNIQUE: Multidetector CT imaging of the abdomen and pelvis was performed
using the standard protocol following bolus administration of
intravenous contrast.
CONTRAST:  100mL CF4XSD-FHH IOPAMIDOL (CF4XSD-FHH) INJECTION 61%

[Series 2: axial st · axial · 0.85mm/px · z∈[-509,-59]mm · 13 of 102 slices shown, 15 images]
[im 6/102  soft-tissue]
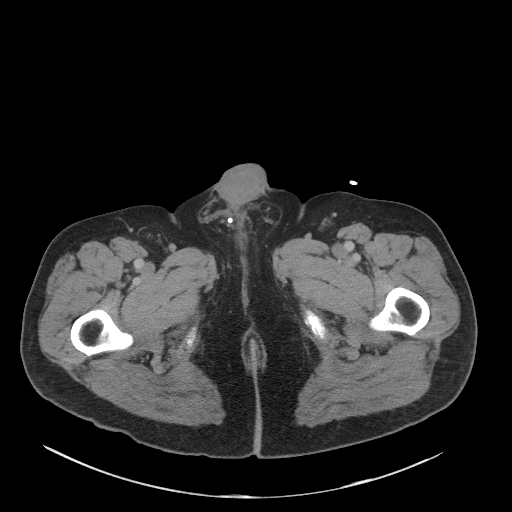
[im 6/102  bone]
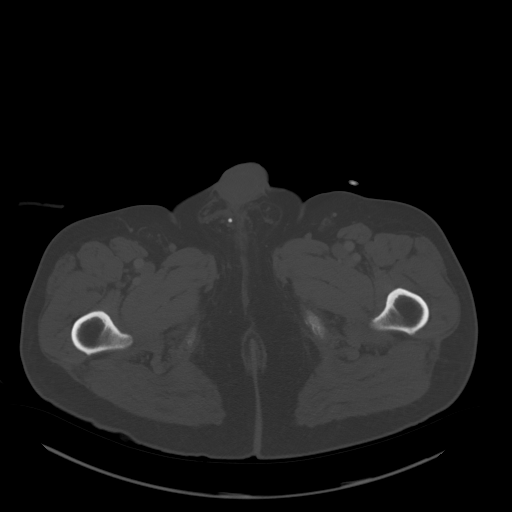
[im 12/102  soft-tissue]
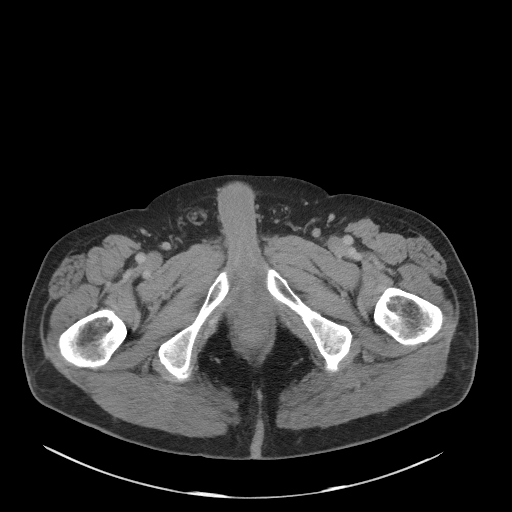
[im 23/102  soft-tissue]
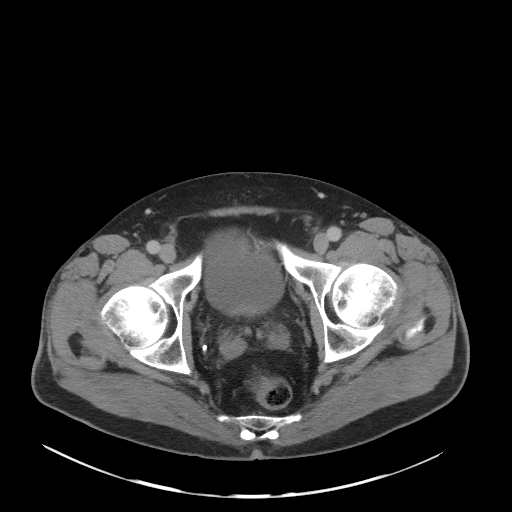
[im 29/102  soft-tissue]
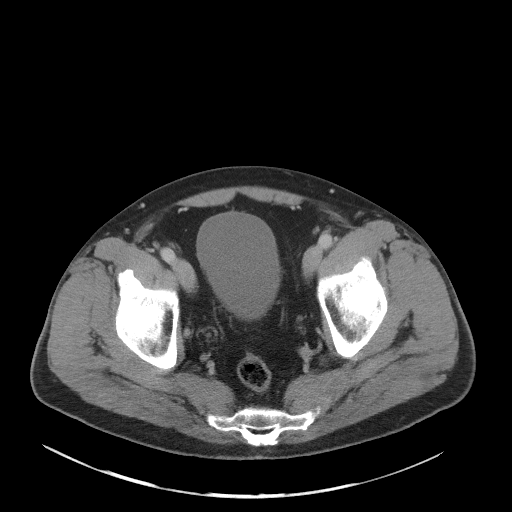
[im 34/102  soft-tissue]
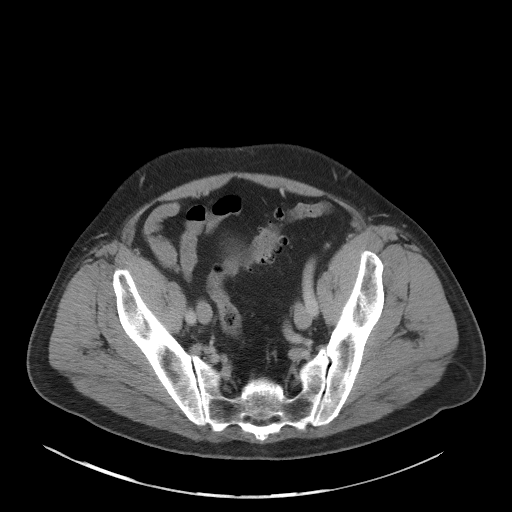
[im 45/102  soft-tissue]
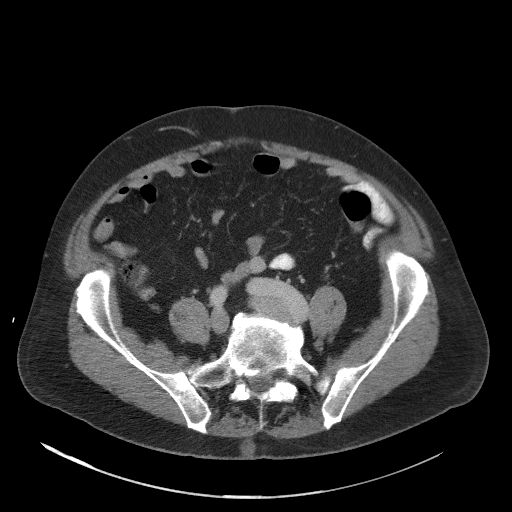
[im 51/102  soft-tissue]
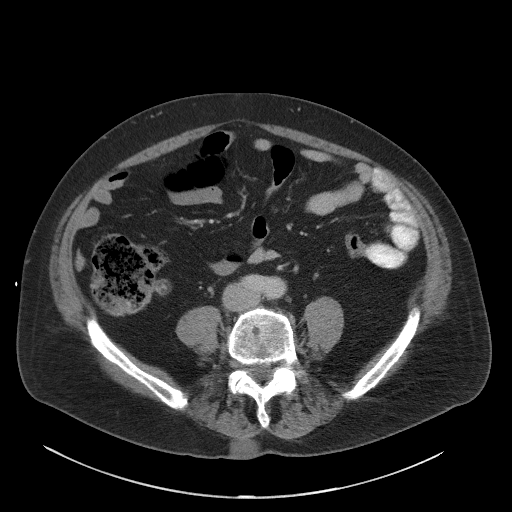
[im 57/102  soft-tissue]
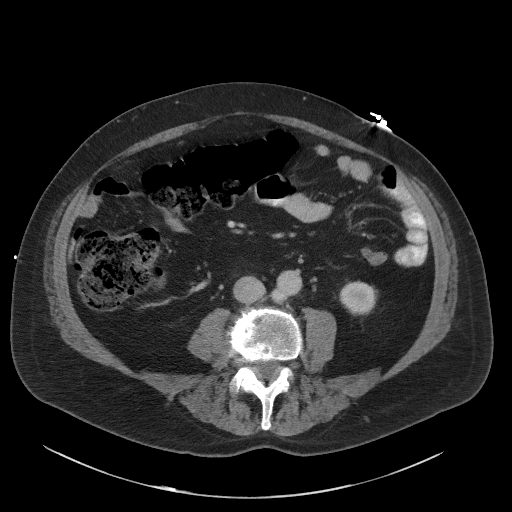
[im 68/102  soft-tissue]
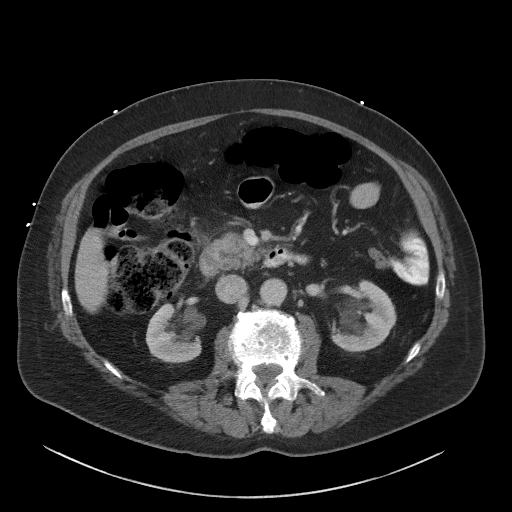
[im 68/102  bone]
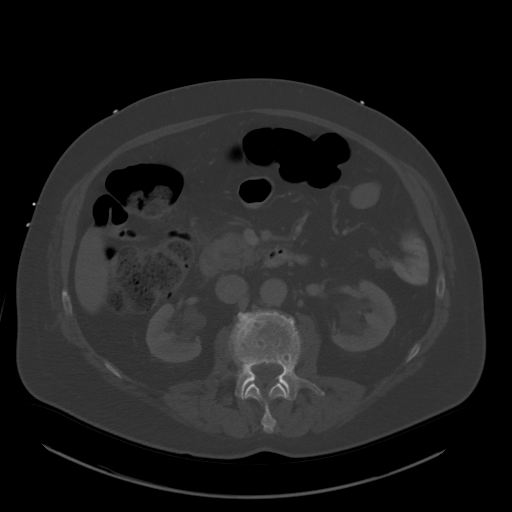
[im 73/102  soft-tissue]
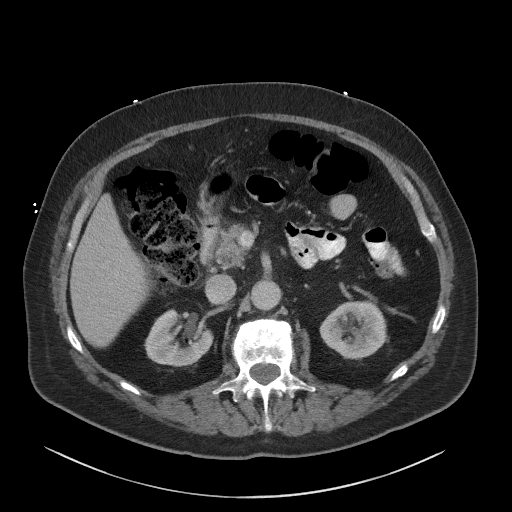
[im 79/102  soft-tissue]
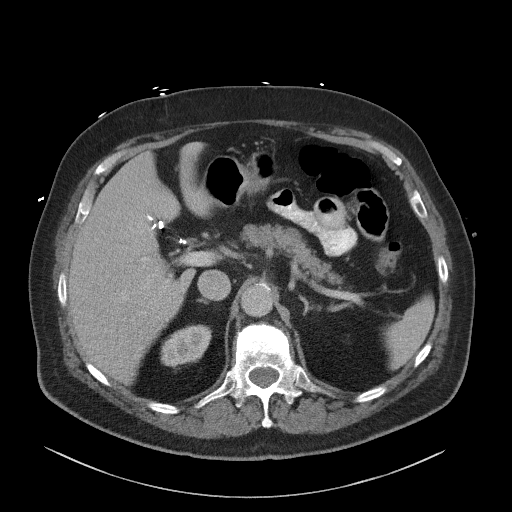
[im 90/102  soft-tissue]
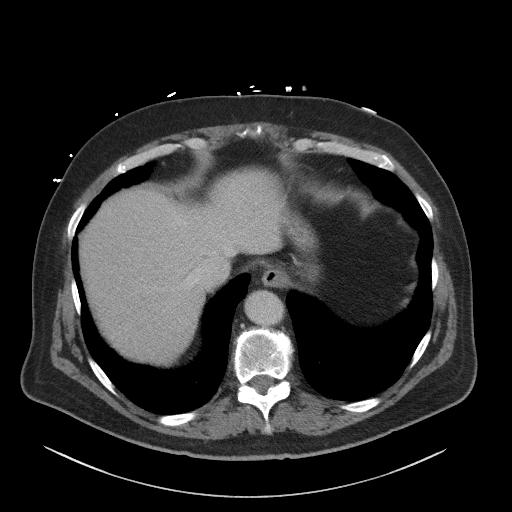
[im 96/102  soft-tissue]
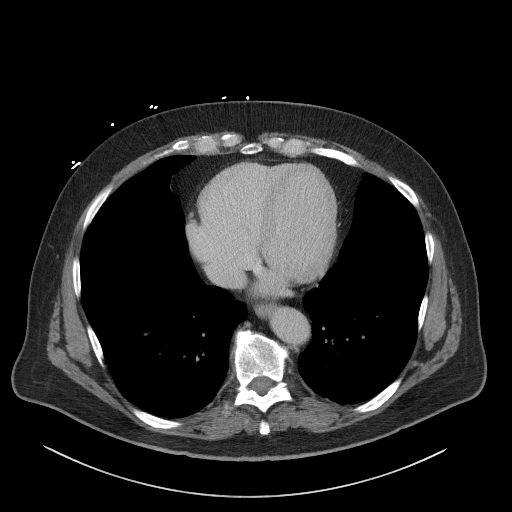

[Series 5: coronal st · coronal · 0.86mm/px · 3 of 102 slices shown]
[im 34/102  soft-tissue]
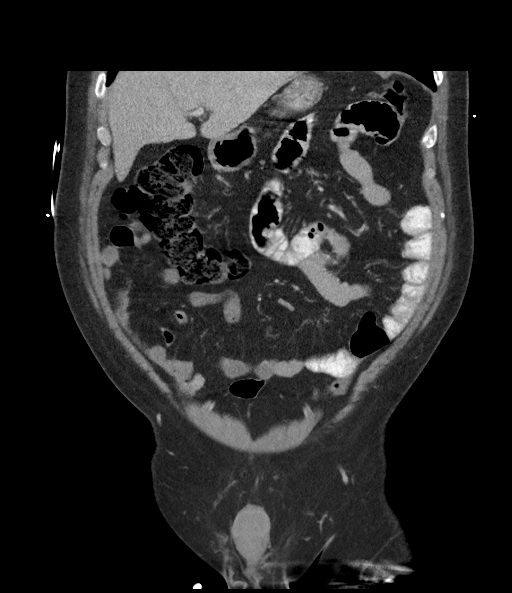
[im 45/102  soft-tissue]
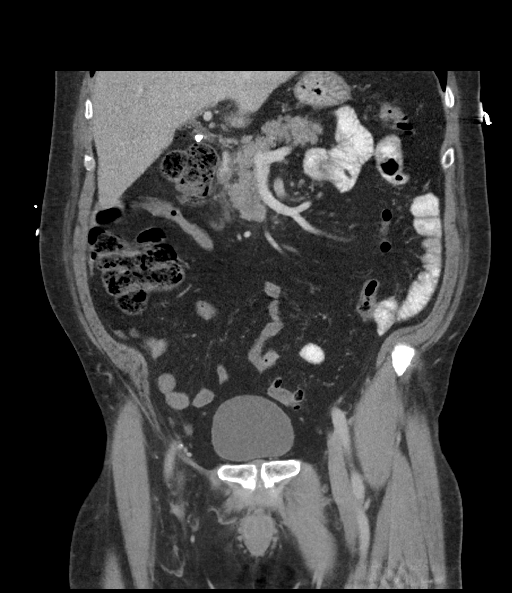
[im 57/102  soft-tissue]
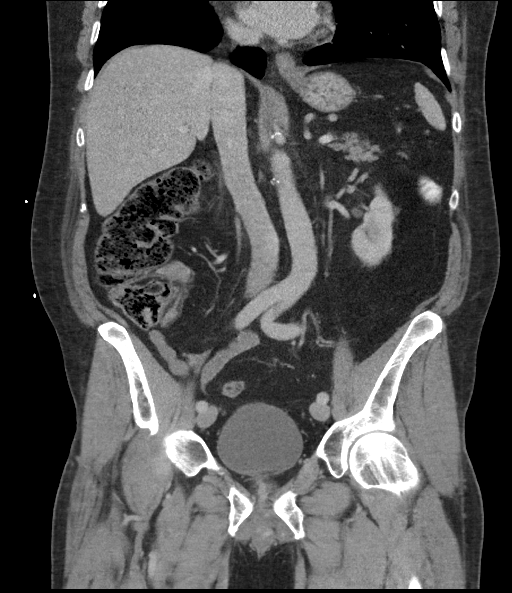

[16 of 46 positions shown; findings below may reference images not displayed]

FINDINGS: Lower chest: No acute abnormality.

Hepatobiliary: No focal liver abnormality is seen. Status post
cholecystectomy. No biliary dilatation.

Pancreas: There is mild inflammation adjacent to the pancreatic
head. No pancreatic mass. There is homogeneous pancreatic
enhancement. No peripancreatic fluid collection to suggest a
pseudocyst or abscess.

Spleen: Normal in size without focal abnormality.

Adrenals/Urinary Tract: No adrenal masses. Mild bilateral renal
cortical thinning. Symmetric renal enhancement excretion. Bilateral
cracks and small bilateral renal sinus cysts. Posterior lower pole
left renal cortical cyst measuring 1 cm. No other masses, no stones
and no hydronephrosis. Normal ureters. Bladder is unremarkable.

Stomach/Bowel: Stomach is unremarkable. There is mild wall
thickening of the medial wall of the second and third portions of
the duodenum which is presumed reactive to the adjacent pancreatic
inflammation. Small bowel is unremarkable. There are multiple
colonic diverticula without diverticulitis. No other colonic
abnormality. Normal appendix.

Vascular/Lymphatic: No significant vascular findings are present.
The portal vein, superior mesenteric vein and splenic veins are
widely patent. No enlarged abdominal or pelvic lymph nodes.

Reproductive: Prostate is mildly enlarged.

Other: No abdominal wall hernia.  No ascites.

Musculoskeletal: No fracture or acute finding. No osteoblastic or
osteolytic lesions.
IMPRESSION: 1. Mild uncomplicated pancreatitis involving the pancreatic head. No
evidence of pancreatic necrosis, abscess or pseudocyst or venous
thrombosis.
2. No other acute abnormalities within the abdomen or pelvis.
3. Status post cholecystectomy.
4. Renal cysts.
5. Colonic diverticula.

## 2018-07-30 ENCOUNTER — Other Ambulatory Visit: Payer: Self-pay | Admitting: Family Medicine

## 2018-09-20 ENCOUNTER — Ambulatory Visit: Payer: Medicare Other | Admitting: *Deleted

## 2018-09-25 ENCOUNTER — Other Ambulatory Visit: Payer: Self-pay | Admitting: Family Medicine

## 2018-09-27 ENCOUNTER — Ambulatory Visit: Payer: Medicare Other | Admitting: Family Medicine

## 2018-10-20 ENCOUNTER — Other Ambulatory Visit: Payer: Self-pay | Admitting: Family Medicine

## 2018-11-09 ENCOUNTER — Other Ambulatory Visit: Payer: Self-pay | Admitting: Family Medicine

## 2018-11-09 DIAGNOSIS — I10 Essential (primary) hypertension: Secondary | ICD-10-CM

## 2018-11-14 ENCOUNTER — Other Ambulatory Visit: Payer: Self-pay | Admitting: Family Medicine

## 2018-12-04 DIAGNOSIS — H6691 Otitis media, unspecified, right ear: Secondary | ICD-10-CM | POA: Diagnosis not present

## 2018-12-07 ENCOUNTER — Other Ambulatory Visit: Payer: Self-pay | Admitting: Family Medicine

## 2019-01-01 ENCOUNTER — Other Ambulatory Visit: Payer: Self-pay | Admitting: Family Medicine

## 2019-01-03 ENCOUNTER — Other Ambulatory Visit: Payer: Self-pay | Admitting: Family Medicine

## 2019-01-03 DIAGNOSIS — I10 Essential (primary) hypertension: Secondary | ICD-10-CM

## 2019-01-07 NOTE — Telephone Encounter (Signed)
So Diovan 80 mg once daily would replace Losartan 50 mg daily, would like a bp check and cmp a month after the change.

## 2019-01-07 NOTE — Telephone Encounter (Signed)
This medication needs directions.... I do not see where this med was in the history or when it was prescribed Please advise

## 2019-01-24 ENCOUNTER — Other Ambulatory Visit: Payer: Self-pay | Admitting: Family Medicine

## 2019-02-16 ENCOUNTER — Other Ambulatory Visit: Payer: Self-pay | Admitting: Family Medicine

## 2019-03-13 ENCOUNTER — Other Ambulatory Visit: Payer: Self-pay | Admitting: Family Medicine

## 2019-04-07 ENCOUNTER — Other Ambulatory Visit: Payer: Self-pay | Admitting: Family Medicine

## 2019-05-01 ENCOUNTER — Other Ambulatory Visit: Payer: Self-pay | Admitting: Family Medicine

## 2019-05-02 ENCOUNTER — Other Ambulatory Visit: Payer: Self-pay | Admitting: Family Medicine

## 2019-05-02 DIAGNOSIS — I10 Essential (primary) hypertension: Secondary | ICD-10-CM

## 2019-05-16 NOTE — Telephone Encounter (Signed)
10/28 mychart message came back as unread. Mailed letter.

## 2019-05-21 ENCOUNTER — Ambulatory Visit: Payer: Medicare Other

## 2019-05-23 ENCOUNTER — Ambulatory Visit (INDEPENDENT_AMBULATORY_CARE_PROVIDER_SITE_OTHER): Payer: Medicare Other

## 2019-05-23 ENCOUNTER — Other Ambulatory Visit: Payer: Self-pay

## 2019-05-23 DIAGNOSIS — Z23 Encounter for immunization: Secondary | ICD-10-CM | POA: Diagnosis not present

## 2019-05-24 ENCOUNTER — Other Ambulatory Visit: Payer: Self-pay | Admitting: Family Medicine

## 2019-05-28 ENCOUNTER — Other Ambulatory Visit: Payer: Self-pay

## 2019-06-03 ENCOUNTER — Other Ambulatory Visit: Payer: Self-pay

## 2019-06-03 ENCOUNTER — Ambulatory Visit (INDEPENDENT_AMBULATORY_CARE_PROVIDER_SITE_OTHER): Payer: Medicare Other | Admitting: Family Medicine

## 2019-06-03 ENCOUNTER — Encounter: Payer: Self-pay | Admitting: Family Medicine

## 2019-06-03 VITALS — BP 137/83 | Wt 130.0 lb

## 2019-06-03 DIAGNOSIS — R739 Hyperglycemia, unspecified: Secondary | ICD-10-CM | POA: Diagnosis not present

## 2019-06-03 DIAGNOSIS — I10 Essential (primary) hypertension: Secondary | ICD-10-CM | POA: Diagnosis not present

## 2019-06-03 DIAGNOSIS — E6609 Other obesity due to excess calories: Secondary | ICD-10-CM | POA: Diagnosis not present

## 2019-06-03 DIAGNOSIS — M109 Gout, unspecified: Secondary | ICD-10-CM | POA: Diagnosis not present

## 2019-06-03 DIAGNOSIS — E782 Mixed hyperlipidemia: Secondary | ICD-10-CM

## 2019-06-06 NOTE — Assessment & Plan Note (Signed)
encouraged heart healthy diet, avoid trans fats, minimize simple carbs and saturated fats. Increase exercise as tolerated 

## 2019-06-06 NOTE — Assessment & Plan Note (Signed)
Monitor vitals weekly, no changes to meds. Encouraged heart healthy diet such as the DASH diet and exercise as tolerated.  

## 2019-06-06 NOTE — Assessment & Plan Note (Signed)
hgba1c acceptable, minimize simple carbs. Increase exercise as tolerated.  

## 2019-06-06 NOTE — Progress Notes (Signed)
Virtual Visit via phone Note  I connected with Anthony Poole on 06/03/19 at  2:00 PM EST by a phone enabled telemedicine application and verified that I am speaking with the correct person using two identifiers.  Location: Patient: home Provider: office   I discussed the limitations of evaluation and management by telemedicine and the availability of in person appointments. The patient expressed understanding and agreed to proceed. Crissie Sickles, CMA was able to get the patient set up on a phone visit after not being able to set up the video visit   Subjective:    Patient ID: Anthony Poole, male    DOB: August 01, 1940, 78 y.o.   MRN: [id number]  No chief complaint on file.   HPI Patient is in today for follow up onc hronic medical concerns including hypertension, hyperlipidemia, obesity, gout and more. No recent febrile illness or hospitalizations. He does have some ongoing trouble with foot pain at times but no gout type pain with redness, warth, swelling. He is maintaining quarantine well. Is not staying too active. Denies CP/palp/SOB/HA/congestion/fevers/GI or GU c/o. Taking meds as prescribed  Past Medical History:  Diagnosis Date  . Acute pancreatitis 10/25/2016  . Anemia 07/29/2012  . 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 Constipation 10/26/2016  . Costochondritis 12/12/2016  . Epistaxis 09/30/2016  . GERD (gastroesophageal reflux disease)   . Headache 04/20/2014  . Hypertension   . Hypocalcemia 03/16/2017  . Medicare annual wellness visit, subsequent 04/14/2014  . Obesity 10/20/2014  . Other and unspecified hyperlipidemia 04/14/2013  . Pancreatitis 10/22/2016  . Preventative health care 07/29/2012  . Shingles 12/12/2012  . Sinusitis, acute 09/05/2012  . Tachycardia 10/14/2014  . Tick bite 11/19/2015    Past Surgical History:  Procedure Laterality Date  . CHOLECYSTECTOMY      Family History  Problem Relation Age of Onset  . COPD Mother        worked  in [employer], clots  . Peripheral vascular disease Mother   . Dementia Father   . Peripheral vascular disease Brother     Social History   Socioeconomic History  . Marital status: Married    Spouse name: Not on file  . Number of children: Not on file  . Years of education: Not on file  . Highest education level: Not on file  Occupational History  . Not on file  Social Needs  . Financial resource strain: Not on file  . Food insecurity    Worry: Not on file    Inability: Not on file  . Transportation needs    Medical: Not on file    Non-medical: Not on file  Tobacco Use  . Smoking status: Never Smoker  . Smokeless tobacco: Never Used  Substance and Sexual Activity  . Alcohol use: No  . Drug use: No  . Sexual activity: Never    Comment: lives with wife, no dietary restrictions,  no fish  Lifestyle  . Physical activity    Days per week: Not on file    Minutes per session: Not on file  . Stress: Not on file  Relationships  . Social Musician on phone: Not on file    Gets together: Not on file    Attends religious service: Not on file    Active member of club or organization: Not on file    Attends meetings of clubs or organizations: Not on file    Relationship status: Not  on file  . Intimate partner violence    Fear of current or ex partner: Not on file    Emotionally abused: Not on file    Physically abused: Not on file    Forced sexual activity: Not on file  Other Topics Concern  . Not on file  Social History Narrative  . Not on file    Outpatient Medications Prior to Visit  Medication Sig Dispense Refill  . allopurinol (ZYLOPRIM) 100 MG tablet TAKE 1 TABLET BY MOUTH EVERY DAY 30 tablet 0  . amLODipine (NORVASC) 5 MG tablet TAKE 1 TABLET BY MOUTH EVERY DAY 90 tablet 1  . aspirin 81 MG tablet Take 81 mg by mouth 2 (two) times daily.    Marland Kitchen. MAGNESIUM PO Take by mouth daily.    . Misc Natural Products (OSTEO BI-FLEX ADV JOINT SHIELD PO) Take by mouth  daily.    . Multiple Vitamins-Minerals (CENTRUM SILVER ADULT 50+ PO) Take by mouth daily.    . mupirocin ointment (BACTROBAN) 2 % Place 1 application into the nose at bedtime. 22 g 1  . Probiotic Product (PROBIOTIC DAILY) CAPS [employer] Colon Health-Take 1 capsule by mouth daily.    Marland Kitchen. tiZANidine-Liniment (TIZANIDINE COMFORT PAC) 4 MG MISC 1-4 mg by Combination route at bedtime as needed. 40 each 2  . valsartan (DIOVAN) 80 MG tablet Take 1 tablet (80 mg total) by mouth daily. 90 tablet 1   No facility-administered medications prior to visit.     Allergies  Allergen Reactions  . Fish Allergy Itching  . Propulsid [Cisapride] Hives    Review of Systems  Constitutional: Negative for fever and malaise/fatigue.  HENT: Negative for congestion.   Eyes: Negative for blurred vision.  Respiratory: Negative for shortness of breath.   Cardiovascular: Negative for chest pain, palpitations and leg swelling.  Gastrointestinal: Negative for abdominal pain, blood in stool and nausea.  Genitourinary: Negative for dysuria and frequency.  Musculoskeletal: Positive for joint pain. Negative for falls.  Skin: Negative for rash.  Neurological: Negative for dizziness, loss of consciousness and headaches.  Endo/Heme/Allergies: Negative for environmental allergies.  Psychiatric/Behavioral: Negative for depression. The patient is not nervous/anxious.        Objective:    Physical Exam unable to obtain via phone  BP 137/83 (BP Location: Left Arm, Patient Position: Sitting, Cuff Size: Normal)   Wt 130 lb (59 kg)   BMI 16.25 kg/m  Wt Readings from Last 3 Encounters:  06/03/19 130 lb (59 kg)  05/28/18 241 lb (109.3 kg)  03/01/18 242 lb 9.6 oz (110 kg)    Diabetic Foot Exam - Simple   No data filed     Lab Results  Component Value Date   WBC 6.1 03/01/2018   HGB 13.8 03/01/2018   HCT 40.5 03/01/2018   PLT 255.0 03/01/2018   GLUCOSE 84 03/01/2018   CHOL 188 03/01/2018   TRIG 55.0 03/01/2018    HDL 62.40 03/01/2018   LDLCALC 114 (H) 03/01/2018   ALT 22 03/01/2018   AST 23 03/01/2018   NA 139 03/01/2018   K 4.2 03/01/2018   CL 104 03/01/2018   CREATININE 1.26 03/01/2018   BUN 19 03/01/2018   CO2 27 03/01/2018   TSH 2.46 03/01/2018   PSA 0.47 04/14/2014   INR 1.13 10/27/2016   HGBA1C 5.8 03/01/2018    Lab Results  Component Value Date   TSH 2.46 03/01/2018   Lab Results  Component Value Date   WBC 6.1 03/01/2018  HGB 13.8 03/01/2018   HCT 40.5 03/01/2018   MCV 96.6 03/01/2018   PLT 255.0 03/01/2018   Lab Results  Component Value Date   NA 139 03/01/2018   K 4.2 03/01/2018   CO2 27 03/01/2018   GLUCOSE 84 03/01/2018   BUN 19 03/01/2018   CREATININE 1.26 03/01/2018   BILITOT 0.5 03/01/2018   ALKPHOS 74 03/01/2018   AST 23 03/01/2018   ALT 22 03/01/2018   PROT 7.0 03/01/2018   ALBUMIN 4.0 03/01/2018   CALCIUM 8.9 03/01/2018   ANIONGAP 11 10/27/2016   GFR 59.11 (L) 03/01/2018   Lab Results  Component Value Date   CHOL 188 03/01/2018   Lab Results  Component Value Date   HDL 62.40 03/01/2018   Lab Results  Component Value Date   LDLCALC 114 (H) 03/01/2018   Lab Results  Component Value Date   TRIG 55.0 03/01/2018   Lab Results  Component Value Date   CHOLHDL 3 03/01/2018   Lab Results  Component Value Date   HGBA1C 5.8 03/01/2018       Assessment & Plan:   Problem List Items Addressed This Visit    Essential hypertension - Primary    Monitor vitals weekly,  no changes to meds. Encouraged heart healthy diet such as the DASH diet and exercise as tolerated.       Relevant Orders   CBC   CMP   TSH   Hyperlipidemia, mixed     encouraged heart healthy diet, avoid trans fats, minimize simple carbs and saturated fats. Increase exercise as tolerated      Relevant Orders   Lipid panel   Obesity    Encouraged DASH diet, decrease po intake and increase exercise as tolerated. Needs 7-8 hours of sleep nightly. Avoid trans fats, eat  small, frequent meals every 4-5 hours with lean proteins, complex carbs and healthy fats. Minimize simple carbs and processed foods      Gout of foot    No significant flares on allopurinol, continue and hydrate      Relevant Orders   Uric acid   Hyperglycemia    hgba1c acceptable, minimize simple carbs. Increase exercise as tolerated.       Relevant Orders   A1C      I am having Jasiel L. Treml maintain his Multiple Vitamins-Minerals (CENTRUM SILVER ADULT 50+ PO), Misc Natural Products (OSTEO BI-FLEX ADV JOINT SHIELD PO), MAGNESIUM PO, aspirin, Probiotic Daily, mupirocin ointment, tiZANidine Comfort Pac, valsartan, amLODipine, and allopurinol.  No orders of the defined types were placed in this encounter.    I discussed the assessment and treatment plan with the patient. The patient was provided an opportunity to ask questions and all were answered. The patient agreed with the plan and demonstrated an understanding of the instructions.   The patient was advised to call back or seek an in-person evaluation if the symptoms worsen or if the condition fails to improve as anticipated.  I provided 25 minutes of non-face-to-face time during this encounter.   Penni Homans, MD

## 2019-06-06 NOTE — Assessment & Plan Note (Signed)
No significant flares on allopurinol, continue and hydrate

## 2019-06-06 NOTE — Assessment & Plan Note (Signed)
Encouraged DASH diet, decrease po intake and increase exercise as tolerated. Needs 7-8 hours of sleep nightly. Avoid trans fats, eat small, frequent meals every 4-5 hours with lean proteins, complex carbs and healthy fats. Minimize simple carbs and processed foods.  

## 2019-06-18 ENCOUNTER — Other Ambulatory Visit: Payer: Self-pay | Admitting: Family Medicine

## 2019-06-18 DIAGNOSIS — I10 Essential (primary) hypertension: Secondary | ICD-10-CM

## 2019-06-19 ENCOUNTER — Other Ambulatory Visit: Payer: Self-pay | Admitting: Family Medicine

## 2019-06-20 NOTE — Telephone Encounter (Signed)
Last OV 06/03/19 Last refill 05/24/19 #30/0 Next OV not scheduled

## 2019-07-22 ENCOUNTER — Other Ambulatory Visit: Payer: Self-pay

## 2019-07-22 ENCOUNTER — Other Ambulatory Visit (INDEPENDENT_AMBULATORY_CARE_PROVIDER_SITE_OTHER): Payer: Medicare Other

## 2019-07-22 DIAGNOSIS — I10 Essential (primary) hypertension: Secondary | ICD-10-CM

## 2019-07-22 DIAGNOSIS — M109 Gout, unspecified: Secondary | ICD-10-CM | POA: Diagnosis not present

## 2019-07-22 DIAGNOSIS — E782 Mixed hyperlipidemia: Secondary | ICD-10-CM | POA: Diagnosis not present

## 2019-07-22 DIAGNOSIS — R739 Hyperglycemia, unspecified: Secondary | ICD-10-CM | POA: Diagnosis not present

## 2019-07-22 LAB — LIPID PANEL
Cholesterol: 174 mg/dL (ref 0–200)
HDL: 50.8 mg/dL (ref >39.00)
LDL Cholesterol: 98 mg/dL (ref 0–99)
NonHDL: 123.13
Total CHOL/HDL Ratio: 3
Triglycerides: 127 mg/dL (ref 0.0–149.0)
VLDL: 25.4 mg/dL (ref 0.0–40.0)

## 2019-07-22 LAB — TSH: TSH: 1.29 u[IU]/mL (ref 0.35–4.50)

## 2019-07-22 LAB — COMPREHENSIVE METABOLIC PANEL
ALT: 16 U/L (ref 0–53)
AST: 19 U/L (ref 0–37)
Albumin: 4 g/dL (ref 3.5–5.2)
Alkaline Phosphatase: 82 U/L (ref 39–117)
BUN: 14 mg/dL (ref 6–23)
CO2: 27 mEq/L (ref 19–32)
Calcium: 9.2 mg/dL (ref 8.4–10.5)
Chloride: 105 mEq/L (ref 96–112)
Creatinine, Ser: 1.14 mg/dL (ref 0.40–1.50)
GFR: 62.19 mL/min (ref >60.00)
Glucose, Bld: 96 mg/dL (ref 70–99)
Potassium: 4.5 mEq/L (ref 3.5–5.1)
Sodium: 139 mEq/L (ref 135–145)
Total Bilirubin: 0.6 mg/dL (ref 0.2–1.2)
Total Protein: 6.9 g/dL (ref 6.0–8.3)

## 2019-07-22 LAB — CBC
HCT: 44.6 % (ref 39.0–52.0)
Hemoglobin: 14.6 g/dL (ref 13.0–17.0)
MCHC: 32.7 g/dL (ref 30.0–36.0)
MCV: 99 fl (ref 78.0–100.0)
Platelets: 254 10*3/uL (ref 150.0–400.0)
RBC: 4.51 Mil/uL (ref 4.22–5.81)
RDW: 13.7 % (ref 11.5–15.5)
WBC: 6.5 10*3/uL (ref 4.0–10.5)

## 2019-07-22 LAB — HEMOGLOBIN A1C: Hgb A1c MFr Bld: 5.7 % (ref 4.6–6.5)

## 2019-07-22 LAB — URIC ACID: Uric Acid, Serum: 5.5 mg/dL (ref 4.0–7.8)

## 2019-09-04 DIAGNOSIS — Z23 Encounter for immunization: Secondary | ICD-10-CM | POA: Diagnosis not present

## 2019-09-12 ENCOUNTER — Other Ambulatory Visit: Payer: Self-pay | Admitting: Family Medicine

## 2019-09-30 NOTE — Progress Notes (Signed)
Nurse connected with patient 10/01/19 at  3:15 PM EDT by a telephone enabled telemedicine application and verified that I am speaking with the correct person using two identifiers. Patient stated full name and DOB. Patient gave permission to continue with virtual visit. Patient's location was at home and Nurse's location was at Anthony Poole office.   Subjective:   Anthony Poole is a 79 y.o. male who presents for Medicare Annual/Subsequent preventive examination.  Review of Systems:  Home Safety/Smoke Alarms: Feels safe in home. Smoke alarms in place.  Lives w/ wife. 1 story home w/ basement. Does well w/ stairs.   Male:   CCS-  Pt states he will discuss w/ PCP 10/03/19   PSA-  Lab Results  Component Value Date   PSA 0.47 04/14/2014   PSA 0.57 04/12/2013   PSA 0.53 04/02/2012       Objective:    Vitals: Unable to assess. This visit is enabled though telemedicine due to Covid 19.   Advanced Directives 10/01/2019 10/27/2016 10/22/2016 10/04/2016 05/23/2016 05/20/2015  Does Patient Have a Medical Advance Directive? Yes No No No No No  Type of Estate agent of [place];Living will - - - - -  Does patient want to make changes to medical advance directive? No - Patient declined - - - - -  Copy of Healthcare Power of Attorney in Chart? No - copy requested - - - - -  Would patient like information on creating a medical advance directive? - - - No - Patient declined Yes - Transport planner given Yes - Transport planner given    Tobacco Social History   Tobacco Use  Smoking Status Never Smoker  Smokeless Tobacco Never Used     Counseling given: Not Answered   Clinical Intake: Pain : No/denies pain     Past Medical History:  Diagnosis Date  . Acute pancreatitis 10/25/2016  . Anemia 07/29/2012  . 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 Constipation 10/26/2016  . Costochondritis 12/12/2016  . Epistaxis 09/30/2016  . GERD (gastroesophageal  reflux disease)   . Headache 04/20/2014  . Hypertension   . Hypocalcemia 03/16/2017  . Medicare annual wellness visit, subsequent 04/14/2014  . Obesity 10/20/2014  . Other and unspecified hyperlipidemia 04/14/2013  . Pancreatitis 10/22/2016  . Preventative health care 07/29/2012  . Shingles 12/12/2012  . Sinusitis, acute 09/05/2012  . Tachycardia 10/14/2014  . Tick bite 11/19/2015   Past Surgical History:  Procedure Laterality Date  . CHOLECYSTECTOMY     Family History  Problem Relation Age of Onset  . COPD Mother        worked in [employer], clots  . Peripheral vascular disease Mother   . Dementia Father   . Peripheral vascular disease Brother    Social History   Socioeconomic History  . Marital status: Married    Spouse name: Not on file  . Number of children: Not on file  . Years of education: Not on file  . Highest education level: Not on file  Occupational History  . Not on file  Tobacco Use  . Smoking status: Never Smoker  . Smokeless tobacco: Never Used  Substance and Sexual Activity  . Alcohol use: No  . Drug use: No  . Sexual activity: Never    Comment: lives with wife, no dietary restrictions,  no fish  Other Topics Concern  . Not on file  Social History Narrative  . Not on file   Social  Determinants of Health   Financial Resource Strain:   . Difficulty of Paying Living Expenses:   Food Insecurity:   . Worried About Charity fundraiser in the Last Year:   . Arboriculturist in the Last Year:   Transportation Needs:   . Film/video editor (Medical):   Marland Kitchen Lack of Transportation (Non-Medical):   Physical Activity:   . Days of Exercise per Week:   . Minutes of Exercise per Session:   Stress:   . Feeling of Stress :   Social Connections:   . Frequency of Communication with Friends and Family:   . Frequency of Social Gatherings with Friends and Family:   . Attends Religious Services:   . Active Member of Clubs or Organizations:   . Attends Theatre manager Meetings:   Marland Kitchen Marital Status:     Outpatient Encounter Medications as of 10/01/2019  Medication Sig  . allopurinol (ZYLOPRIM) 100 MG tablet TAKE 1 TABLET BY MOUTH EVERY DAY  . amLODipine (NORVASC) 5 MG tablet TAKE 1 TABLET BY MOUTH EVERY DAY  . aspirin 81 MG tablet Take 81 mg by mouth daily.   Marland Kitchen MAGNESIUM PO Take by mouth daily.  . Misc Natural Products (OSTEO BI-FLEX ADV JOINT SHIELD PO) Take by mouth daily.  . Multiple Vitamins-Minerals (CENTRUM SILVER ADULT 50+ PO) Take by mouth daily.  . mupirocin ointment (BACTROBAN) 2 % Place 1 application into the nose at bedtime.  . Probiotic Product (PROBIOTIC DAILY) CAPS [employer] Colon Health-Take 1 capsule by mouth daily.  Marland Kitchen tiZANidine-Liniment (TIZANIDINE COMFORT PAC) 4 MG MISC 1-4 mg by Combination route at bedtime as needed.  . valsartan (DIOVAN) 80 MG tablet TAKE 1 TABLET BY MOUTH EVERY DAY *REPLACES LOSARTAN  . [DISCONTINUED] losartan (COZAAR) 50 MG tablet TAKE 1 TABLET BY MOUTH EVERY DAY   No facility-administered encounter medications on file as of 10/01/2019.    Activities of Daily Living In your present state of health, do you have any difficulty performing the following activities: 10/01/2019  Hearing? N  Vision? N  Difficulty concentrating or making decisions? N  Walking or climbing stairs? N  Dressing or bathing? N  Doing errands, shopping? N  Preparing Food and eating ? N  Using the Toilet? N  In the past six months, have you accidently leaked urine? N  Do you have problems with loss of bowel control? N  Managing your Medications? N  Managing your Finances? N  Housekeeping or managing your Housekeeping? N  Some recent data might be hidden    Patient Care Team: Mosie Lukes, MD as PCP - General (Family Medicine)   Assessment:   This is a routine wellness examination for Anthony Poole. Physical assessment deferred to PCP.  Exercise Activities and Dietary recommendations Exercise limited by: None identified    Diet (meal preparation, eat out, water intake, caffeinated beverages, dairy products, fruits and vegetables): 24 hr recall Breakfast: rice krispies. Lunch: nabs  Dinner:    Interior and spatial designer    . DIET - EAT MORE FRUITS AND VEGETABLES       Fall Risk Fall Risk  10/01/2019 05/28/2019 05/22/2018 06/10/2016 05/23/2016  Falls in the past year? 0 0 0 No No  Comment - [facility] Telephone Survey: data to providers prior to load [employer] Telephone Survey: data to providers prior to load [employer] Telephone Survey: data to providers prior to load -  Number falls in past yr: 0 - - - -  Injury with Fall?  0 - - - -  Follow up Education provided;Falls prevention discussed - - - -   Depression Screen PHQ 2/9 Scores 10/01/2019 05/23/2016 05/20/2015 10/14/2014  PHQ - 2 Score 0 0 0 0    Cognitive Function Ad8 score reviewed for issues:  Issues making decisions:no  Less interest in hobbies / activities:no  Repeats questions, stories (family complaining):no  Trouble using ordinary gadgets (microwave, computer, phone):no  Forgets the month or year: no  Mismanaging finances: no  Remembering appts:no  Daily problems with thinking and/or memory:no Ad8 score is=0            Immunization History  Administered Date(s) Administered  . Fluad Quad(high Dose 65+) 05/23/2019  . Influenza Split 04/02/2012  . Influenza, High Dose Seasonal PF 04/12/2013, 05/23/2016, 03/16/2017, 03/01/2018  . Influenza,inj,Quad PF,6+ Mos 04/14/2014, 05/20/2015  . Moderna SARS-COVID-2 Vaccination 09/04/2019  . Pneumococcal Conjugate-13 04/14/2014  . Pneumococcal Polysaccharide-23 09/26/2016  . Tdap 10/06/2011  . Zoster 10/06/2011    Screening Tests Health Maintenance  Topic Date Due  . TETANUS/TDAP  10/05/2021  . INFLUENZA VACCINE  Completed  . PNA vac Low Risk Adult  Completed       Plan:    Please schedule your next medicare wellness visit with me in 1 yr.  Continue to eat heart healthy diet (full of fruits,  vegetables, whole grains, lean protein, water--limit salt, fat, and sugar intake) and increase physical activity as tolerated.  Continue doing brain stimulating activities (puzzles, reading, adult coloring books, staying active) to keep memory sharp.   Bring a copy of your living will and/or healthcare power of attorney to your next office visit.   I have personally reviewed and noted the following in the patient's chart:   . Medical and social history . Use of alcohol, tobacco or illicit drugs  . Current medications and supplements . Functional ability and status . Nutritional status . Physical activity . Advanced directives . List of other physicians . Hospitalizations, surgeries, and ER visits in previous 12 months . Vitals . Screenings to include cognitive, depression, and falls . Referrals and appointments  In addition, I have reviewed and discussed with patient certain preventive protocols, quality metrics, and best practice recommendations. A written personalized care plan for preventive services as well as general preventive health recommendations were provided to patient.     Avon Gully, [place]  10/01/2019

## 2019-10-01 ENCOUNTER — Encounter: Payer: Self-pay | Admitting: *Deleted

## 2019-10-01 ENCOUNTER — Other Ambulatory Visit: Payer: Self-pay

## 2019-10-01 ENCOUNTER — Ambulatory Visit (INDEPENDENT_AMBULATORY_CARE_PROVIDER_SITE_OTHER): Payer: Medicare Other | Admitting: *Deleted

## 2019-10-01 DIAGNOSIS — Z23 Encounter for immunization: Secondary | ICD-10-CM | POA: Diagnosis not present

## 2019-10-01 DIAGNOSIS — Z0000 Encounter for general adult medical examination without abnormal findings: Secondary | ICD-10-CM | POA: Diagnosis not present

## 2019-10-01 NOTE — Patient Instructions (Signed)
Please schedule your next medicare wellness visit with me in 1 yr.  Continue to eat heart healthy diet (full of fruits, vegetables, whole grains, lean protein, water--limit salt, fat, and sugar intake) and increase physical activity as tolerated.  Continue doing brain stimulating activities (puzzles, reading, adult coloring books, staying active) to keep memory sharp.   Bring a copy of your living will and/or healthcare power of attorney to your next office visit.   Anthony Poole , Thank you for taking time to come for your Medicare Wellness Visit. I appreciate your ongoing commitment to your health goals. Please review the following plan we discussed and let me know if I can assist you in the future.   These are the goals we discussed: Goals    . DIET - EAT MORE FRUITS AND VEGETABLES       This is a list of the screening recommended for you and due dates:  Health Maintenance  Topic Date Due  . Tetanus Vaccine  10/05/2021  . Flu Shot  Completed  . Pneumonia vaccines  Completed    Preventive Care 43 Years and Older, Male Preventive care refers to lifestyle choices and visits with your health care provider that can promote health and wellness. This includes:  A yearly physical exam. This is also called an annual well check.  Regular dental and eye exams.  Immunizations.  Screening for certain conditions.  Healthy lifestyle choices, such as diet and exercise. What can I expect for my preventive care visit? Physical exam Your health care provider will check:  Height and weight. These may be used to calculate body mass index (BMI), which is a measurement that tells if you are at a healthy weight.  Heart rate and blood pressure.  Your skin for abnormal spots. Counseling Your health care provider may ask you questions about:  Alcohol, tobacco, and drug use.  Emotional well-being.  Home and relationship well-being.  Sexual activity.  Eating habits.  History of  falls.  Memory and ability to understand (cognition).  Work and work Statistician. What immunizations do I need?  Influenza (flu) vaccine  This is recommended every year. Tetanus, diphtheria, and pertussis (Tdap) vaccine  You may need a Td booster every 10 years. Varicella (chickenpox) vaccine  You may need this vaccine if you have not already been vaccinated. Zoster (shingles) vaccine  You may need this after age 48. Pneumococcal conjugate (PCV13) vaccine  One dose is recommended after age 46. Pneumococcal polysaccharide (PPSV23) vaccine  One dose is recommended after age 84. Measles, mumps, and rubella (MMR) vaccine  You may need at least one dose of MMR if you were born in 1957 or later. You may also need a second dose. Meningococcal conjugate (MenACWY) vaccine  You may need this if you have certain conditions. Hepatitis A vaccine  You may need this if you have certain conditions or if you travel or work in places where you may be exposed to hepatitis A. Hepatitis B vaccine  You may need this if you have certain conditions or if you travel or work in places where you may be exposed to hepatitis B. Haemophilus influenzae type b (Hib) vaccine  You may need this if you have certain conditions. You may receive vaccines as individual doses or as more than one vaccine together in one shot (combination vaccines). Talk with your health care provider about the risks and benefits of combination vaccines. What tests do I need? Blood tests  Lipid and cholesterol levels. These  may be checked every 5 years, or more frequently depending on your overall health.  Hepatitis C test.  Hepatitis B test. Screening  Lung cancer screening. You may have this screening every year starting at age 69 if you have a 30-pack-year history of smoking and currently smoke or have quit within the past 15 years.  Colorectal cancer screening. All adults should have this screening starting at age 35  and continuing until age 22. Your health care provider may recommend screening at age 15 if you are at increased risk. You will have tests every 1-10 years, depending on your results and the type of screening test.  Prostate cancer screening. Recommendations will vary depending on your family history and other risks.  Diabetes screening. This is done by checking your blood sugar (glucose) after you have not eaten for a while (fasting). You may have this done every 1-3 years.  Abdominal aortic aneurysm (AAA) screening. You may need this if you are a current or former smoker.  Sexually transmitted disease (STD) testing. Follow these instructions at home: Eating and drinking  Eat a diet that includes fresh fruits and vegetables, whole grains, lean protein, and low-fat dairy products. Limit your intake of foods with high amounts of sugar, saturated fats, and salt.  Take vitamin and mineral supplements as recommended by your health care provider.  Do not drink alcohol if your health care provider tells you not to drink.  If you drink alcohol: ? Limit how much you have to 0-2 drinks a day. ? Be aware of how much alcohol is in your drink. In the [place] one drink equals one 12 oz bottle of beer (355 mL), one 5 oz glass of wine (148 mL), or one 1 oz glass of hard liquor (44 mL). Lifestyle  Take daily care of your teeth and gums.  Stay active. Exercise for at least 30 minutes on 5 or more days each week.  Do not use any products that contain nicotine or tobacco, such as cigarettes, e-cigarettes, and chewing tobacco. If you need help quitting, ask your health care provider.  If you are sexually active, practice safe sex. Use a condom or other form of protection to prevent STIs (sexually transmitted infections).  Talk with your health care provider about taking a low-dose aspirin or statin. What's next?  Visit your health care provider once a year for a well check visit.  Ask your health care  provider how often you should have your eyes and teeth checked.  Stay up to date on all vaccines. This information is not intended to replace advice given to you by your health care provider. Make sure you discuss any questions you have with your health care provider. Document Revised: 06/14/2018 Document Reviewed: 06/14/2018 Elsevier Patient Education  2020 [employer].

## 2019-10-02 ENCOUNTER — Other Ambulatory Visit: Payer: Self-pay

## 2019-10-03 ENCOUNTER — Ambulatory Visit (INDEPENDENT_AMBULATORY_CARE_PROVIDER_SITE_OTHER): Payer: Medicare Other | Admitting: Family Medicine

## 2019-10-03 ENCOUNTER — Other Ambulatory Visit: Payer: Self-pay

## 2019-10-03 DIAGNOSIS — M109 Gout, unspecified: Secondary | ICD-10-CM

## 2019-10-03 DIAGNOSIS — E782 Mixed hyperlipidemia: Secondary | ICD-10-CM

## 2019-10-03 DIAGNOSIS — I10 Essential (primary) hypertension: Secondary | ICD-10-CM

## 2019-10-03 DIAGNOSIS — R739 Hyperglycemia, unspecified: Secondary | ICD-10-CM | POA: Diagnosis not present

## 2019-10-03 MED ORDER — VALSARTAN 40 MG PO TABS: 40.0000 mg | ORAL_TABLET | Freq: Every day | ORAL | 1 refills | Status: DC

## 2019-10-03 NOTE — Patient Instructions (Signed)
Shingrix is the new shingles shot, 2 shots over 2 to 6 months  Hypertension, Adult High blood pressure (hypertension) is when the force of blood pumping through the arteries is too strong. The arteries are the blood vessels that carry blood from the heart throughout the body. Hypertension forces the heart to work harder to pump blood and may cause arteries to become narrow or stiff. Untreated or uncontrolled hypertension can cause a heart attack, heart failure, a stroke, kidney disease, and other problems. A blood pressure reading consists of a higher number over a lower number. Ideally, your blood pressure should be below 120/80. The first ("top") number is called the systolic pressure. It is a measure of the pressure in your arteries as your heart beats. The second ("bottom") number is called the diastolic pressure. It is a measure of the pressure in your arteries as the heart relaxes. What are the causes? The exact cause of this condition is not known. There are some conditions that result in or are related to high blood pressure. What increases the risk? Some risk factors for high blood pressure are under your control. The following factors may make you more likely to develop this condition:  Smoking.  Having type 2 diabetes mellitus, high cholesterol, or both.  Not getting enough exercise or physical activity.  Being overweight.  Having too much fat, sugar, calories, or salt (sodium) in your diet.  Drinking too much alcohol. Some risk factors for high blood pressure may be difficult or impossible to change. Some of these factors include:  Having chronic kidney disease.  Having a family history of high blood pressure.  Age. Risk increases with age.  Race. You may be at higher risk if you are African American.  Gender. Men are at higher risk than women before age 76. After age 27, women are at higher risk than men.  Having obstructive sleep apnea.  Stress. What are the signs or  symptoms? High blood pressure may not cause symptoms. Very high blood pressure (hypertensive crisis) may cause:  Headache.  Anxiety.  Shortness of breath.  Nosebleed.  Nausea and vomiting.  Vision changes.  Severe chest pain.  Seizures. How is this diagnosed? This condition is diagnosed by measuring your blood pressure while you are seated, with your arm resting on a flat surface, your legs uncrossed, and your feet flat on the floor. The cuff of the blood pressure monitor will be placed directly against the skin of your upper arm at the level of your heart. It should be measured at least twice using the same arm. Certain conditions can cause a difference in blood pressure between your right and left arms. Certain factors can cause blood pressure readings to be lower or higher than normal for a short period of time:  When your blood pressure is higher when you are in a health care provider's office than when you are at home, this is called white coat hypertension. Most people with this condition do not need medicines.  When your blood pressure is higher at home than when you are in a health care provider's office, this is called masked hypertension. Most people with this condition may need medicines to control blood pressure. If you have a high blood pressure reading during one visit or you have normal blood pressure with other risk factors, you may be asked to:  Return on a different day to have your blood pressure checked again.  Monitor your blood pressure at home for 1 week  or longer. If you are diagnosed with hypertension, you may have other blood or imaging tests to help your health care provider understand your overall risk for other conditions. How is this treated? This condition is treated by making healthy lifestyle changes, such as eating healthy foods, exercising more, and reducing your alcohol intake. Your health care provider may prescribe medicine if lifestyle changes  are not enough to get your blood pressure under control, and if:  Your systolic blood pressure is above 130.  Your diastolic blood pressure is above 80. Your personal target blood pressure may vary depending on your medical conditions, your age, and other factors. Follow these instructions at home: Eating and drinking   Eat a diet that is high in fiber and potassium, and low in sodium, added sugar, and fat. An example eating plan is called the DASH (Dietary Approaches to Stop Hypertension) diet. To eat this way: ? Eat plenty of fresh fruits and vegetables. Try to fill one half of your plate at each meal with fruits and vegetables. ? Eat whole grains, such as whole-wheat pasta, brown rice, or whole-grain bread. Fill about one fourth of your plate with whole grains. ? Eat or drink low-fat dairy products, such as skim milk or low-fat yogurt. ? Avoid fatty cuts of meat, processed or cured meats, and poultry with skin. Fill about one fourth of your plate with lean proteins, such as fish, chicken without skin, beans, eggs, or tofu. ? Avoid pre-made and processed foods. These tend to be higher in sodium, added sugar, and fat.  Reduce your daily sodium intake. Most people with hypertension should eat less than 1,500 mg of sodium a day.  Do not drink alcohol if: ? Your health care provider tells you not to drink. ? You are pregnant, may be pregnant, or are planning to become pregnant.  If you drink alcohol: ? Limit how much you use to:  0-1 drink a day for women.  0-2 drinks a day for men. ? Be aware of how much alcohol is in your drink. In the [place] one drink equals one 12 oz bottle of beer (355 mL), one 5 oz glass of wine (148 mL), or one 1 oz glass of hard liquor (44 mL). Lifestyle   Work with your health care provider to maintain a healthy body weight or to lose weight. Ask what an ideal weight is for you.  Get at least 30 minutes of exercise most days of the week. Activities may  include walking, swimming, or biking.  Include exercise to strengthen your muscles (resistance exercise), such as Pilates or lifting weights, as part of your weekly exercise routine. Try to do these types of exercises for 30 minutes at least 3 days a week.  Do not use any products that contain nicotine or tobacco, such as cigarettes, e-cigarettes, and chewing tobacco. If you need help quitting, ask your health care provider.  Monitor your blood pressure at home as told by your health care provider.  Keep all follow-up visits as told by your health care provider. This is important. Medicines  Take over-the-counter and prescription medicines only as told by your health care provider. Follow directions carefully. Blood pressure medicines must be taken as prescribed.  Do not skip doses of blood pressure medicine. Doing this puts you at risk for problems and can make the medicine less effective.  Ask your health care provider about side effects or reactions to medicines that you should watch for. Contact a health care  provider if you:  Think you are having a reaction to a medicine you are taking.  Have headaches that keep coming back (recurring).  Feel dizzy.  Have swelling in your ankles.  Have trouble with your vision. Get help right away if you:  Develop a severe headache or confusion.  Have unusual weakness or numbness.  Feel faint.  Have severe pain in your chest or abdomen.  Vomit repeatedly.  Have trouble breathing. Summary  Hypertension is when the force of blood pumping through your arteries is too strong. If this condition is not controlled, it may put you at risk for serious complications.  Your personal target blood pressure may vary depending on your medical conditions, your age, and other factors. For most people, a normal blood pressure is less than 120/80.  Hypertension is treated with lifestyle changes, medicines, or a combination of both. Lifestyle changes  include losing weight, eating a healthy, low-sodium diet, exercising more, and limiting alcohol. This information is not intended to replace advice given to you by your health care provider. Make sure you discuss any questions you have with your health care provider. Document Revised: 02/28/2018 Document Reviewed: 02/28/2018 Elsevier Patient Education  2020 [employer].

## 2019-10-07 NOTE — Assessment & Plan Note (Signed)
Encouraged heart healthy diet, increase exercise, avoid trans fats, consider a krill oil cap daily 

## 2019-10-07 NOTE — Assessment & Plan Note (Signed)
No recent flare, hydrate and continue current meds.

## 2019-10-07 NOTE — Assessment & Plan Note (Signed)
hgba1c acceptable, minimize simple carbs. Increase exercise as tolerated.  

## 2019-10-07 NOTE — Progress Notes (Signed)
Subjective:    Patient ID: Anthony Poole, male    DOB: 1941-06-02, 79 y.o.   MRN: [id number]  Chief Complaint  Patient presents with  . Hypertension    4 month follow up .    HPI Patient is in today for follow up on  Chronic medical concerns. No recent febrile illness or hospitalizations. He was feeling badly on Valsartan 80 mg daily so he dropped to every other day and he feels better. His blood pressure was dropping but it has stabilized now. Denies CP/palp/SOB/HA/congestion/fevers/GI or GU c/o. Taking meds as prescribed  Past Medical History:  Diagnosis Date  . Acute pancreatitis 10/25/2016  . Anemia 07/29/2012  . 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 Constipation 10/26/2016  . Costochondritis 12/12/2016  . Epistaxis 09/30/2016  . GERD (gastroesophageal reflux disease)   . Headache 04/20/2014  . Hypertension   . Hypocalcemia 03/16/2017  . Medicare annual wellness visit, subsequent 04/14/2014  . Obesity 10/20/2014  . Other and unspecified hyperlipidemia 04/14/2013  . Pancreatitis 10/22/2016  . Preventative health care 07/29/2012  . Shingles 12/12/2012  . Sinusitis, acute 09/05/2012  . Tachycardia 10/14/2014  . Tick bite 11/19/2015    Past Surgical History:  Procedure Laterality Date  . CHOLECYSTECTOMY      Family History  Problem Relation Age of Onset  . COPD Mother        worked in [employer], clots  . Peripheral vascular disease Mother   . Dementia Father   . Peripheral vascular disease Brother     Social History   Socioeconomic History  . Marital status: Married    Spouse name: Not on file  . Number of children: Not on file  . Years of education: Not on file  . Highest education level: Not on file  Occupational History  . Not on file  Tobacco Use  . Smoking status: Never Smoker  . Smokeless tobacco: Never Used  Substance and Sexual Activity  . Alcohol use: No  . Drug use: No  . Sexual activity: Never    Comment: lives with wife, no  dietary restrictions,  no fish  Other Topics Concern  . Not on file  Social History Narrative  . Not on file   Social Determinants of Health   Financial Resource Strain: Low Risk   . Difficulty of Paying Living Expenses: Not hard at all  Food Insecurity: No Food Insecurity  . Worried About Programme researcher, broadcasting/film/video in the Last Year: Never true  . Ran Out of Food in the Last Year: Never true  Transportation Needs: No Transportation Needs  . Lack of Transportation (Medical): No  . Lack of Transportation (Non-Medical): No  Physical Activity:   . Days of Exercise per Week:   . Minutes of Exercise per Session:   Stress:   . Feeling of Stress :   Social Connections:   . Frequency of Communication with Friends and Family:   . Frequency of Social Gatherings with Friends and Family:   . Attends Religious Services:   . Active Member of Clubs or Organizations:   . Attends Banker Meetings:   Marland Kitchen Marital Status:   Intimate Partner Violence:   . Fear of Current or Ex-Partner:   . Emotionally Abused:   Marland Kitchen Physically Abused:   . Sexually Abused:     Outpatient Medications Prior to Visit  Medication Sig Dispense Refill  . allopurinol (ZYLOPRIM) 100 MG tablet TAKE 1 TABLET  BY MOUTH EVERY DAY 90 tablet 1  . amLODipine (NORVASC) 5 MG tablet TAKE 1 TABLET BY MOUTH EVERY DAY 90 tablet 1  . aspirin 81 MG tablet Take 81 mg by mouth daily.     Marland Kitchen MAGNESIUM PO Take by mouth daily.    . Misc Natural Products (OSTEO BI-FLEX ADV JOINT SHIELD PO) Take by mouth daily.    . Multiple Vitamins-Minerals (CENTRUM SILVER ADULT 50+ PO) Take by mouth daily.    . mupirocin ointment (BACTROBAN) 2 % Place 1 application into the nose at bedtime. 22 g 1  . Probiotic Product (PROBIOTIC DAILY) CAPS Vear Clock Colon Health-Take 1 capsule by mouth daily.    Marland Kitchen tiZANidine-Liniment (TIZANIDINE COMFORT PAC) 4 MG MISC 1-4 mg by Combination route at bedtime as needed. 40 each 2  . valsartan (DIOVAN) 80 MG tablet TAKE 1  TABLET BY MOUTH EVERY DAY *REPLACES LOSARTAN 90 tablet 1   No facility-administered medications prior to visit.    Allergies  Allergen Reactions  . Fish Allergy Itching  . Propulsid [Cisapride] Hives    Review of Systems  Constitutional: Negative for fever and malaise/fatigue.  HENT: Negative for congestion.   Eyes: Negative for blurred vision.  Respiratory: Negative for shortness of breath.   Cardiovascular: Negative for chest pain, palpitations and leg swelling.  Gastrointestinal: Negative for abdominal pain, blood in stool and nausea.  Genitourinary: Negative for dysuria and frequency.  Musculoskeletal: Negative for falls.  Skin: Negative for rash.  Neurological: Negative for dizziness, loss of consciousness and headaches.  Endo/Heme/Allergies: Negative for environmental allergies.  Psychiatric/Behavioral: Negative for depression. The patient is not nervous/anxious.        Objective:    Physical Exam Vitals and nursing note reviewed.  Constitutional:      General: He is not in acute distress.    Appearance: He is well-developed.  HENT:     Head: Normocephalic and atraumatic.     Nose: Nose normal.  Eyes:     General:        Right eye: No discharge.        Left eye: No discharge.  Cardiovascular:     Rate and Rhythm: Normal rate and regular rhythm.     Heart sounds: No murmur.  Pulmonary:     Effort: Pulmonary effort is normal.     Breath sounds: Normal breath sounds.  Abdominal:     General: Bowel sounds are normal.     Palpations: Abdomen is soft.     Tenderness: There is no abdominal tenderness.  Musculoskeletal:     Cervical back: Normal range of motion and neck supple.  Skin:    General: Skin is warm and dry.  Neurological:     Mental Status: He is alert and oriented to person, place, and time.     BP 140/79 (BP Location: Left Arm, Patient Position: Sitting, Cuff Size: Large)   Pulse 79   Temp (!) 97.4 F (36.3 C) (Temporal)   Resp 18   Ht 6\' 3"   (1.905 m)   Wt 243 lb 9.6 oz (110.5 kg)   SpO2 98%   BMI 30.45 kg/m  Wt Readings from Last 3 Encounters:  10/03/19 243 lb 9.6 oz (110.5 kg)  06/03/19 130 lb (59 kg)  05/28/18 241 lb (109.3 kg)    Diabetic Foot Exam - Simple   No data filed     Lab Results  Component Value Date   WBC 6.5 07/22/2019   HGB 14.6 07/22/2019  HCT 44.6 07/22/2019   PLT 254.0 07/22/2019   GLUCOSE 96 07/22/2019   CHOL 174 07/22/2019   TRIG 127.0 07/22/2019   HDL 50.80 07/22/2019   LDLCALC 98 07/22/2019   ALT 16 07/22/2019   AST 19 07/22/2019   NA 139 07/22/2019   K 4.5 07/22/2019   CL 105 07/22/2019   CREATININE 1.14 07/22/2019   BUN 14 07/22/2019   CO2 27 07/22/2019   TSH 1.29 07/22/2019   PSA 0.47 04/14/2014   INR 1.13 10/27/2016   HGBA1C 5.7 07/22/2019    Lab Results  Component Value Date   TSH 1.29 07/22/2019   Lab Results  Component Value Date   WBC 6.5 07/22/2019   HGB 14.6 07/22/2019   HCT 44.6 07/22/2019   MCV 99.0 07/22/2019   PLT 254.0 07/22/2019   Lab Results  Component Value Date   NA 139 07/22/2019   K 4.5 07/22/2019   CO2 27 07/22/2019   GLUCOSE 96 07/22/2019   BUN 14 07/22/2019   CREATININE 1.14 07/22/2019   BILITOT 0.6 07/22/2019   ALKPHOS 82 07/22/2019   AST 19 07/22/2019   ALT 16 07/22/2019   PROT 6.9 07/22/2019   ALBUMIN 4.0 07/22/2019   CALCIUM 9.2 07/22/2019   ANIONGAP 11 10/27/2016   GFR 62.19 07/22/2019   Lab Results  Component Value Date   CHOL 174 07/22/2019   Lab Results  Component Value Date   HDL 50.80 07/22/2019   Lab Results  Component Value Date   LDLCALC 98 07/22/2019   Lab Results  Component Value Date   TRIG 127.0 07/22/2019   Lab Results  Component Value Date   CHOLHDL 3 07/22/2019   Lab Results  Component Value Date   HGBA1C 5.7 07/22/2019       Assessment & Plan:   Problem List Items Addressed This Visit    Essential hypertension    Well controlled on 40 mg. Encouraged heart healthy diet such as the DASH  diet and exercise as tolerated. He had to drop from 80 mg to every other day as his blood pressure was dropping and he felt badly so he started taking it every other day and he feels better.       Relevant Medications   valsartan (DIOVAN) 40 MG tablet   Hyperlipidemia, mixed    Encouraged heart healthy diet, increase exercise, avoid trans fats, consider a krill oil cap daily      Relevant Medications   valsartan (DIOVAN) 40 MG tablet   Gout of foot    No recent flare, hydrate and continue current meds.       Hyperglycemia    hgba1c acceptable, minimize simple carbs. Increase exercise as tolerated.         I have discontinued Dominica Severin L. Fausto's valsartan. I am also having him start on valsartan. Additionally, I am having him maintain his Multiple Vitamins-Minerals (CENTRUM SILVER ADULT 50+ PO), Misc Natural Products (OSTEO BI-FLEX ADV JOINT SHIELD PO), MAGNESIUM PO, aspirin, Probiotic Daily, mupirocin ointment, tiZANidine Comfort Pac, amLODipine, and allopurinol.  Meds ordered this encounter  Medications  . valsartan (DIOVAN) 40 MG tablet    Sig: Take 1 tablet (40 mg total) by mouth daily.    Dispense:  90 tablet    Refill:  1     Penni Homans, MD

## 2019-10-07 NOTE — Assessment & Plan Note (Addendum)
Well controlled on 40 mg. Encouraged heart healthy diet such as the DASH diet and exercise as tolerated. He had to drop from 80 mg to every other day as his blood pressure was dropping and he felt badly so he started taking it every other day and he feels better.

## 2019-10-10 DIAGNOSIS — Z1211 Encounter for screening for malignant neoplasm of colon: Secondary | ICD-10-CM | POA: Diagnosis not present

## 2019-10-10 LAB — COLOGUARD: Cologuard: NEGATIVE

## 2019-10-15 ENCOUNTER — Encounter: Payer: Self-pay | Admitting: *Deleted

## 2019-10-15 LAB — EXTERNAL GENERIC LAB PROCEDURE: COLOGUARD: NEGATIVE

## 2019-10-15 LAB — COLOGUARD: COLOGUARD: NEGATIVE

## 2019-10-24 ENCOUNTER — Other Ambulatory Visit: Payer: Self-pay | Admitting: Family Medicine

## 2019-10-24 DIAGNOSIS — I10 Essential (primary) hypertension: Secondary | ICD-10-CM

## 2019-12-10 ENCOUNTER — Other Ambulatory Visit: Payer: Self-pay | Admitting: Family Medicine

## 2019-12-10 DIAGNOSIS — I10 Essential (primary) hypertension: Secondary | ICD-10-CM

## 2019-12-10 NOTE — Telephone Encounter (Signed)
I believe he should be taking 40 mg daily please confirm with him, d/c the 80s and send in the 40s

## 2019-12-10 NOTE — Telephone Encounter (Signed)
Is he suppose to be on this every other day still or just the 40mg  only?  The 80mg  was d/c on that day.  This is from your last note  "Well controlled on 40 mg. Encouraged heart healthy diet such as the DASH diet and exercise as tolerated. He had to drop from 80 mg to every other day as his blood pressure was dropping and he felt badly so he started taking it every other day and he feels better"

## 2020-03-04 ENCOUNTER — Other Ambulatory Visit: Payer: Self-pay | Admitting: Family Medicine

## 2020-03-05 ENCOUNTER — Encounter: Payer: Self-pay | Admitting: Family Medicine

## 2020-03-05 ENCOUNTER — Other Ambulatory Visit: Payer: Self-pay

## 2020-03-05 ENCOUNTER — Ambulatory Visit (INDEPENDENT_AMBULATORY_CARE_PROVIDER_SITE_OTHER): Payer: Medicare Other | Admitting: Family Medicine

## 2020-03-05 VITALS — BP 146/70 | HR 56 | Resp 15 | Ht 75.0 in | Wt 240.0 lb

## 2020-03-05 DIAGNOSIS — E782 Mixed hyperlipidemia: Secondary | ICD-10-CM | POA: Diagnosis not present

## 2020-03-05 DIAGNOSIS — M109 Gout, unspecified: Secondary | ICD-10-CM

## 2020-03-05 DIAGNOSIS — K5900 Constipation, unspecified: Secondary | ICD-10-CM

## 2020-03-05 DIAGNOSIS — I10 Essential (primary) hypertension: Secondary | ICD-10-CM | POA: Diagnosis not present

## 2020-03-05 DIAGNOSIS — R739 Hyperglycemia, unspecified: Secondary | ICD-10-CM | POA: Diagnosis not present

## 2020-03-05 DIAGNOSIS — Z0000 Encounter for general adult medical examination without abnormal findings: Secondary | ICD-10-CM

## 2020-03-05 NOTE — Patient Instructions (Addendum)
Shingrix is the new shingles shot, 2 shots over 2-6 months at pharmacy [facility] the flu and covid shots by 2 weeks Call for flu shot in about 2 weeks

## 2020-03-06 LAB — COMPREHENSIVE METABOLIC PANEL
AG Ratio: 1.3 (calc) (ref 1.0–2.5)
ALT: 19 U/L (ref 9–46)
AST: 22 U/L (ref 10–35)
Albumin: 3.8 g/dL (ref 3.6–5.1)
Alkaline phosphatase (APISO): 73 U/L (ref 35–144)
BUN/Creatinine Ratio: 13 (calc) (ref 6–22)
BUN: 16 mg/dL (ref 7–25)
CO2: 22 mmol/L (ref 20–32)
Calcium: 8.9 mg/dL (ref 8.6–10.3)
Chloride: 108 mmol/L (ref 98–110)
Creat: 1.28 mg/dL — ABNORMAL HIGH (ref 0.70–1.18)
Globulin: 3 g/dL (calc) (ref 1.9–3.7)
Glucose, Bld: 97 mg/dL (ref 65–99)
Potassium: 4.5 mmol/L (ref 3.5–5.3)
Sodium: 140 mmol/L (ref 135–146)
Total Bilirubin: 0.4 mg/dL (ref 0.2–1.2)
Total Protein: 6.8 g/dL (ref 6.1–8.1)

## 2020-03-06 LAB — LIPID PANEL
Cholesterol: 174 mg/dL (ref <200)
HDL: 47 mg/dL (ref >=40)
LDL Cholesterol (Calc): 106 mg/dL (calc) — ABNORMAL HIGH
Non-HDL Cholesterol (Calc): 127 mg/dL (calc) (ref <130)
Total CHOL/HDL Ratio: 3.7 (calc) (ref <5.0)
Triglycerides: 112 mg/dL (ref <150)

## 2020-03-06 LAB — TSH: TSH: 1.67 mIU/L (ref 0.40–4.50)

## 2020-03-06 LAB — CBC
HCT: 41.1 % (ref 38.5–50.0)
Hemoglobin: 14.2 g/dL (ref 13.2–17.1)
MCH: 32.7 pg (ref 27.0–33.0)
MCHC: 34.5 g/dL (ref 32.0–36.0)
MCV: 94.7 fL (ref 80.0–100.0)
MPV: 10.4 fL (ref 7.5–12.5)
Platelets: 258 10*3/uL (ref 140–400)
RBC: 4.34 10*6/uL (ref 4.20–5.80)
RDW: 12.5 % (ref 11.0–15.0)
WBC: 6.5 10*3/uL (ref 3.8–10.8)

## 2020-03-06 LAB — HEMOGLOBIN A1C
Hgb A1c MFr Bld: 5.9 % of total Hgb — ABNORMAL HIGH (ref <5.7)
Mean Plasma Glucose: 123 (calc)
eAG (mmol/L): 6.8 (calc)

## 2020-03-09 NOTE — Assessment & Plan Note (Signed)
Tolerating statin, encouraged heart healthy diet, avoid trans fats, minimize simple carbs and saturated fats. Increase exercise as tolerated 

## 2020-03-09 NOTE — Assessment & Plan Note (Signed)
Well controlled, no changes to meds. Encouraged heart healthy diet such as the DASH diet and exercise as tolerated.  °

## 2020-03-09 NOTE — Assessment & Plan Note (Signed)
hgba1c acceptable, minimize simple carbs. Increase exercise as tolerated.  

## 2020-03-09 NOTE — Progress Notes (Signed)
Subjective:    Patient ID: Anthony Poole, male    DOB: 05-Oct-1940, 79 y.o.   MRN: [id number]  Chief Complaint  Patient presents with  . Hypertension    follow up    HPI Patient is in today for follow up on chronic medical concerns. He reports he is doing well. No recent febrile illness or hospitalizations. No c/o polyuria or polydipsia. He is trying to stay busy and maintain a heart healthy diet. No new or acute concerns today. Denies CP/palp/SOB/HA/congestion/fevers/GI or GU c/o. Taking meds as prescribed  Past Medical History:  Diagnosis Date  . Acute pancreatitis 10/25/2016  . Anemia 07/29/2012  . 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 Constipation 10/26/2016  . Costochondritis 12/12/2016  . Epistaxis 09/30/2016  . GERD (gastroesophageal reflux disease)   . Headache 04/20/2014  . Hypertension   . Hypocalcemia 03/16/2017  . Medicare annual wellness visit, subsequent 04/14/2014  . Obesity 10/20/2014  . Other and unspecified hyperlipidemia 04/14/2013  . Pancreatitis 10/22/2016  . Preventative health care 07/29/2012  . Shingles 12/12/2012  . Sinusitis, acute 09/05/2012  . Tachycardia 10/14/2014  . Tick bite 11/19/2015    Past Surgical History:  Procedure Laterality Date  . CHOLECYSTECTOMY      Family History  Problem Relation Age of Onset  . COPD Mother        worked in [employer], clots  . Peripheral vascular disease Mother   . Dementia Father   . Peripheral vascular disease Brother     Social History   Socioeconomic History  . Marital status: Married    Spouse name: Not on file  . Number of children: Not on file  . Years of education: Not on file  . Highest education level: Not on file  Occupational History  . Not on file  Tobacco Use  . Smoking status: Never Smoker  . Smokeless tobacco: Never Used  Substance and Sexual Activity  . Alcohol use: No  . Drug use: No  . Sexual activity: Never    Comment: lives with wife, no dietary  restrictions,  no fish  Other Topics Concern  . Not on file  Social History Narrative  . Not on file   Social Determinants of Health   Financial Resource Strain: Low Risk   . Difficulty of Paying Living Expenses: Not hard at all  Food Insecurity: No Food Insecurity  . Worried About Programme researcher, broadcasting/film/video in the Last Year: Never true  . Ran Out of Food in the Last Year: Never true  Transportation Needs: No Transportation Needs  . Lack of Transportation (Medical): No  . Lack of Transportation (Non-Medical): No  Physical Activity:   . Days of Exercise per Week: Not on file  . Minutes of Exercise per Session: Not on file  Stress:   . Feeling of Stress : Not on file  Social Connections:   . Frequency of Communication with Friends and Family: Not on file  . Frequency of Social Gatherings with Friends and Family: Not on file  . Attends Religious Services: Not on file  . Active Member of Clubs or Organizations: Not on file  . Attends Banker Meetings: Not on file  . Marital Status: Not on file  Intimate Partner Violence:   . Fear of Current or Ex-Partner: Not on file  . Emotionally Abused: Not on file  . Physically Abused: Not on file  . Sexually Abused: Not on file  Outpatient Medications Prior to Visit  Medication Sig Dispense Refill  . allopurinol (ZYLOPRIM) 100 MG tablet TAKE 1 TABLET BY MOUTH EVERY DAY 90 tablet 1  . amLODipine (NORVASC) 5 MG tablet TAKE 1 TABLET BY MOUTH EVERY DAY 90 tablet 1  . aspirin 81 MG tablet Take 81 mg by mouth daily.     Marland Kitchen MAGNESIUM PO Take by mouth daily.    . Misc Natural Products (OSTEO BI-FLEX ADV JOINT SHIELD PO) Take by mouth daily.    . Multiple Vitamins-Minerals (CENTRUM SILVER ADULT 50+ PO) Take by mouth daily.    . Probiotic Product (PROBIOTIC DAILY) CAPS Vear Clock Colon Health-Take 1 capsule by mouth daily.    Marland Kitchen tiZANidine-Liniment (TIZANIDINE COMFORT PAC) 4 MG MISC 1-4 mg by Combination route at bedtime as needed. 40 each 2    . valsartan (DIOVAN) 40 MG tablet Take 1 tablet (40 mg total) by mouth daily. 90 tablet 1  . mupirocin ointment (BACTROBAN) 2 % Place 1 application into the nose at bedtime. 22 g 1   No facility-administered medications prior to visit.    Allergies  Allergen Reactions  . Fish Allergy Itching  . Propulsid [Cisapride] Hives    Review of Systems  Constitutional: Negative for fever and malaise/fatigue.  HENT: Negative for congestion.   Eyes: Negative for blurred vision.  Respiratory: Negative for shortness of breath.   Cardiovascular: Negative for chest pain, palpitations and leg swelling.  Gastrointestinal: Negative for abdominal pain, blood in stool and nausea.  Genitourinary: Negative for dysuria and frequency.  Musculoskeletal: Negative for falls.  Skin: Negative for rash.  Neurological: Negative for dizziness, loss of consciousness and headaches.  Endo/Heme/Allergies: Negative for environmental allergies.  Psychiatric/Behavioral: Negative for depression. The patient is not nervous/anxious.        Objective:    Physical Exam Vitals and nursing note reviewed.  Constitutional:      General: He is not in acute distress.    Appearance: He is well-developed.  HENT:     Head: Normocephalic and atraumatic.     Nose: Nose normal.  Eyes:     General:        Right eye: No discharge.        Left eye: No discharge.  Cardiovascular:     Rate and Rhythm: Normal rate and regular rhythm.     Heart sounds: No murmur heard.   Pulmonary:     Effort: Pulmonary effort is normal.     Breath sounds: Normal breath sounds.  Abdominal:     General: Bowel sounds are normal.     Palpations: Abdomen is soft.     Tenderness: There is no abdominal tenderness.  Musculoskeletal:     Cervical back: Normal range of motion and neck supple.  Skin:    General: Skin is warm and dry.  Neurological:     Mental Status: He is alert and oriented to person, place, and time.     BP (!) 146/70 (BP  Location: Left Arm, Patient Position: Sitting, Cuff Size: Normal)   Pulse (!) 56   Resp 15   Ht 6\' 3"  (1.905 m)   Wt 240 lb (108.9 kg)   SpO2 97%   BMI 30.00 kg/m  Wt Readings from Last 3 Encounters:  03/05/20 240 lb (108.9 kg)  10/03/19 243 lb 9.6 oz (110.5 kg)  06/03/19 130 lb (59 kg)    Diabetic Foot Exam - Simple   No data filed     Lab Results  Component  Value Date   WBC 6.5 03/05/2020   HGB 14.2 03/05/2020   HCT 41.1 03/05/2020   PLT 258 03/05/2020   GLUCOSE 97 03/05/2020   CHOL 174 03/05/2020   TRIG 112 03/05/2020   HDL 47 03/05/2020   LDLCALC 106 (H) 03/05/2020   ALT 19 03/05/2020   AST 22 03/05/2020   NA 140 03/05/2020   K 4.5 03/05/2020   CL 108 03/05/2020   CREATININE 1.28 (H) 03/05/2020   BUN 16 03/05/2020   CO2 22 03/05/2020   TSH 1.67 03/05/2020   PSA 0.47 04/14/2014   INR 1.13 10/27/2016   HGBA1C 5.9 (H) 03/05/2020    Lab Results  Component Value Date   TSH 1.67 03/05/2020   Lab Results  Component Value Date   WBC 6.5 03/05/2020   HGB 14.2 03/05/2020   HCT 41.1 03/05/2020   MCV 94.7 03/05/2020   PLT 258 03/05/2020   Lab Results  Component Value Date   NA 140 03/05/2020   K 4.5 03/05/2020   CO2 22 03/05/2020   GLUCOSE 97 03/05/2020   BUN 16 03/05/2020   CREATININE 1.28 (H) 03/05/2020   BILITOT 0.4 03/05/2020   ALKPHOS 82 07/22/2019   AST 22 03/05/2020   ALT 19 03/05/2020   PROT 6.8 03/05/2020   ALBUMIN 4.0 07/22/2019   CALCIUM 8.9 03/05/2020   ANIONGAP 11 10/27/2016   GFR 62.19 07/22/2019   Lab Results  Component Value Date   CHOL 174 03/05/2020   Lab Results  Component Value Date   HDL 47 03/05/2020   Lab Results  Component Value Date   LDLCALC 106 (H) 03/05/2020   Lab Results  Component Value Date   TRIG 112 03/05/2020   Lab Results  Component Value Date   CHOLHDL 3.7 03/05/2020   Lab Results  Component Value Date   HGBA1C 5.9 (H) 03/05/2020       Assessment & Plan:   Problem List Items Addressed This  Visit    Essential hypertension - Primary    Well controlled, no changes to meds. Encouraged heart healthy diet such as the DASH diet and exercise as tolerated.       Relevant Orders   CBC (Completed)   TSH (Completed)   Preventative health care   Hyperlipidemia, mixed    Tolerating statin, encouraged heart healthy diet, avoid trans fats, minimize simple carbs and saturated fats. Increase exercise as tolerated      Relevant Orders   Lipid panel (Completed)   Gout of foot    No recent flares noted. Continue allopurinol and hydrate well.      Hyperglycemia    hgba1c acceptable, minimize simple carbs. Increase exercise as tolerated.       Relevant Orders   Hemoglobin A1c (Completed)   Comprehensive metabolic panel (Completed)   Constipation   Relevant Orders   CBC (Completed)      I have discontinued Jillyn Hidden L. Hoose's mupirocin ointment. I am also having him maintain his Multiple Vitamins-Minerals (CENTRUM SILVER ADULT 50+ PO), Misc Natural Products (OSTEO BI-FLEX ADV JOINT SHIELD PO), MAGNESIUM PO, aspirin, Probiotic Daily, tiZANidine Comfort Pac, valsartan, amLODipine, and allopurinol.  No orders of the defined types were placed in this encounter.    [name], MD

## 2020-03-09 NOTE — Assessment & Plan Note (Signed)
No recent flares noted. Continue allopurinol and hydrate well.

## 2020-04-01 ENCOUNTER — Other Ambulatory Visit: Payer: Self-pay | Admitting: Family Medicine

## 2020-04-01 DIAGNOSIS — I10 Essential (primary) hypertension: Secondary | ICD-10-CM

## 2020-05-31 ENCOUNTER — Other Ambulatory Visit: Payer: Self-pay | Admitting: Family Medicine

## 2020-06-04 ENCOUNTER — Telehealth: Payer: Self-pay | Admitting: Family Medicine

## 2020-06-04 NOTE — Progress Notes (Signed)
  Chronic Care Management   Outreach Note  06/04/2020 Name: Anthony Poole MRN: [id number] DOB: January 15, 1941  Referred by: Bradd Canary, MD Reason for referral : No chief complaint on file.   An unsuccessful telephone outreach was attempted today. The patient was referred to the pharmacist for assistance with care management and care coordination.   Follow Up Plan:   Carley Perdue [employer] Scheduler

## 2020-06-18 ENCOUNTER — Telehealth: Payer: Self-pay | Admitting: Family Medicine

## 2020-06-18 NOTE — Progress Notes (Signed)
  Chronic Care Management   Outreach Note  06/18/2020 Name: MIKYLE SOX MRN: [id number] DOB: Oct 10, 1940  Referred by: Bradd Canary, MD Reason for referral : No chief complaint on file.   A second unsuccessful telephone outreach was attempted today. The patient was referred to pharmacist for assistance with care management and care coordination.  Follow Up Plan:   Carley Perdue [employer] Scheduler

## 2020-07-09 ENCOUNTER — Other Ambulatory Visit: Payer: Self-pay

## 2020-07-09 ENCOUNTER — Ambulatory Visit (INDEPENDENT_AMBULATORY_CARE_PROVIDER_SITE_OTHER): Payer: Medicare Other | Admitting: Family Medicine

## 2020-07-09 DIAGNOSIS — I10 Essential (primary) hypertension: Secondary | ICD-10-CM | POA: Diagnosis not present

## 2020-07-09 DIAGNOSIS — Z23 Encounter for immunization: Secondary | ICD-10-CM | POA: Diagnosis not present

## 2020-07-09 DIAGNOSIS — M109 Gout, unspecified: Secondary | ICD-10-CM

## 2020-07-09 DIAGNOSIS — E782 Mixed hyperlipidemia: Secondary | ICD-10-CM

## 2020-07-09 DIAGNOSIS — R739 Hyperglycemia, unspecified: Secondary | ICD-10-CM

## 2020-07-09 NOTE — Patient Instructions (Signed)

## 2020-07-09 NOTE — Assessment & Plan Note (Signed)
Hydrate and monitor uric acid 

## 2020-07-10 LAB — LIPID PANEL
Cholesterol: 175 mg/dL (ref 0–200)
HDL: 51.2 mg/dL (ref >39.00)
LDL Cholesterol: 109 mg/dL — ABNORMAL HIGH (ref 0–99)
NonHDL: 123.86
Total CHOL/HDL Ratio: 3
Triglycerides: 74 mg/dL (ref 0.0–149.0)
VLDL: 14.8 mg/dL (ref 0.0–40.0)

## 2020-07-10 LAB — COMPREHENSIVE METABOLIC PANEL
ALT: 17 U/L (ref 0–53)
AST: 19 U/L (ref 0–37)
Albumin: 4 g/dL (ref 3.5–5.2)
Alkaline Phosphatase: 74 U/L (ref 39–117)
BUN: 17 mg/dL (ref 6–23)
CO2: 26 mEq/L (ref 19–32)
Calcium: 9.2 mg/dL (ref 8.4–10.5)
Chloride: 105 mEq/L (ref 96–112)
Creatinine, Ser: 1.2 mg/dL (ref 0.40–1.50)
GFR: 57.91 mL/min — ABNORMAL LOW (ref >60.00)
Glucose, Bld: 92 mg/dL (ref 70–99)
Potassium: 4.4 mEq/L (ref 3.5–5.1)
Sodium: 138 mEq/L (ref 135–145)
Total Bilirubin: 0.6 mg/dL (ref 0.2–1.2)
Total Protein: 6.7 g/dL (ref 6.0–8.3)

## 2020-07-10 LAB — HEMOGLOBIN A1C: Hgb A1c MFr Bld: 5.9 % (ref 4.6–6.5)

## 2020-07-10 LAB — CBC WITH DIFFERENTIAL/PLATELET
Basophils Absolute: 0 10*3/uL (ref 0.0–0.1)
Basophils Relative: 0.8 % (ref 0.0–3.0)
Eosinophils Absolute: 0.1 10*3/uL (ref 0.0–0.7)
Eosinophils Relative: 1.8 % (ref 0.0–5.0)
HCT: 43.2 % (ref 39.0–52.0)
Hemoglobin: 14.4 g/dL (ref 13.0–17.0)
Lymphocytes Relative: 27.3 % (ref 12.0–46.0)
Lymphs Abs: 1.7 10*3/uL (ref 0.7–4.0)
MCHC: 33.4 g/dL (ref 30.0–36.0)
MCV: 97.1 fl (ref 78.0–100.0)
Monocytes Absolute: 0.6 10*3/uL (ref 0.1–1.0)
Monocytes Relative: 10.1 % (ref 3.0–12.0)
Neutro Abs: 3.7 10*3/uL (ref 1.4–7.7)
Neutrophils Relative %: 60 % (ref 43.0–77.0)
Platelets: 237 10*3/uL (ref 150.0–400.0)
RBC: 4.45 Mil/uL (ref 4.22–5.81)
RDW: 13.9 % (ref 11.5–15.5)
WBC: 6.2 10*3/uL (ref 4.0–10.5)

## 2020-07-10 LAB — TSH: TSH: 1.25 u[IU]/mL (ref 0.35–4.50)

## 2020-07-12 NOTE — Assessment & Plan Note (Signed)
minimize simple carbs. Increase exercise as tolerated. Continue current meds  

## 2020-07-12 NOTE — Assessment & Plan Note (Signed)
Encouraged heart healthy diet, increase exercise, avoid trans fats, consider a krill oil cap daily 

## 2020-07-12 NOTE — Progress Notes (Unsigned)
Subjective:    Patient ID: Anthony Poole, male    DOB: [birth year], 80 y.o.   MRN: [id number]  Chief Complaint  Patient presents with  . Follow-up    HPI Patient is in today for follow up on chronic medical concerns.no recent febrile illness or hospitalizations. He has stayed largely at home during this past year. Is trying to maintain a heart healthy diet and to stay as active as able. Denies CP/palp/SOB/HA/congestion/fevers/GI or GU c/o. Taking meds as prescribed  Past Medical History:  Diagnosis Date  . Acute pancreatitis 10/25/2016  . Anemia 07/29/2012  . 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 Constipation 10/26/2016  . Costochondritis 12/12/2016  . Epistaxis 09/30/2016  . GERD (gastroesophageal reflux disease)   . Headache 04/20/2014  . Hypertension   . Hypocalcemia 03/16/2017  . Medicare annual wellness visit, subsequent 04/14/2014  . Obesity 10/20/2014  . Other and unspecified hyperlipidemia 04/14/2013  . Pancreatitis 10/22/2016  . Preventative health care 07/29/2012  . Shingles 12/12/2012  . Sinusitis, acute 09/05/2012  . Tachycardia 10/14/2014  . Tick bite 11/19/2015    Past Surgical History:  Procedure Laterality Date  . CHOLECYSTECTOMY      Family History  Problem Relation Age of Onset  . COPD Mother        worked in [employer], clots  . Peripheral vascular disease Mother   . Dementia Father   . Peripheral vascular disease Brother     Social History   Socioeconomic History  . Marital status: Married    Spouse name: Not on file  . Number of children: Not on file  . Years of education: Not on file  . Highest education level: Not on file  Occupational History  . Not on file  Tobacco Use  . Smoking status: Never Smoker  . Smokeless tobacco: Never Used  Substance and Sexual Activity  . Alcohol use: No  . Drug use: No  . Sexual activity: Never    Comment: lives with wife, no dietary restrictions,  no fish  Other Topics Concern  . Not  on file  Social History Narrative  . Not on file   Social Determinants of Health   Financial Resource Strain: Low Risk   . Difficulty of Paying Living Expenses: Not hard at all  Food Insecurity: No Food Insecurity  . Worried About Programme researcher, broadcasting/film/video in the Last Year: Never true  . Ran Out of Food in the Last Year: Never true  Transportation Needs: No Transportation Needs  . Lack of Transportation (Medical): No  . Lack of Transportation (Non-Medical): No  Physical Activity: Not on file  Stress: Not on file  Social Connections: Not on file  Intimate Partner Violence: Not on file    Outpatient Medications Prior to Visit  Medication Sig Dispense Refill  . allopurinol (ZYLOPRIM) 100 MG tablet TAKE 1 TABLET BY MOUTH EVERY DAY 90 tablet 1  . amLODipine (NORVASC) 5 MG tablet Take 1 tablet (5 mg total) by mouth daily. 90 tablet 1  . aspirin 81 MG tablet Take 81 mg by mouth daily.     Marland Kitchen MAGNESIUM PO Take by mouth daily.    . Misc Natural Products (OSTEO BI-FLEX ADV JOINT SHIELD PO) Take by mouth daily.    . Multiple Vitamins-Minerals (CENTRUM SILVER ADULT 50+ PO) Take by mouth daily.    . Probiotic Product (PROBIOTIC DAILY) CAPS Vear Clock Colon Health-Take 1 capsule by mouth daily.    Marland Kitchen  tiZANidine-Liniment (TIZANIDINE COMFORT PAC) 4 MG MISC 1-4 mg by Combination route at bedtime as needed. 40 each 2  . valsartan (DIOVAN) 40 MG tablet TAKE 1 TABLET BY MOUTH EVERY DAY 90 tablet 1   No facility-administered medications prior to visit.    Allergies  Allergen Reactions  . Fish Allergy Itching  . Propulsid [Cisapride] Hives    Review of Systems  Constitutional: Negative for fever and malaise/fatigue.  HENT: Negative for congestion.   Eyes: Negative for blurred vision.  Respiratory: Negative for shortness of breath.   Cardiovascular: Negative for chest pain, palpitations and leg swelling.  Gastrointestinal: Negative for abdominal pain, blood in stool and nausea.  Genitourinary:  Negative for dysuria and frequency.  Musculoskeletal: Negative for falls.  Skin: Negative for rash.  Neurological: Negative for dizziness, loss of consciousness and headaches.  Endo/Heme/Allergies: Negative for environmental allergies.  Psychiatric/Behavioral: Negative for depression. The patient is not nervous/anxious.        Objective:    Physical Exam Vitals and nursing note reviewed.  Constitutional:      General: He is not in acute distress.    Appearance: He is well-developed and well-nourished.  HENT:     Head: Normocephalic and atraumatic.     Nose: Nose normal.  Eyes:     General:        Right eye: No discharge.        Left eye: No discharge.  Cardiovascular:     Rate and Rhythm: Normal rate and regular rhythm.     Heart sounds: No murmur heard.   Pulmonary:     Effort: Pulmonary effort is normal.     Breath sounds: Normal breath sounds.  Abdominal:     General: Bowel sounds are normal.     Palpations: Abdomen is soft.     Tenderness: There is no abdominal tenderness.  Musculoskeletal:        General: No edema.     Cervical back: Normal range of motion and neck supple.  Skin:    General: Skin is warm and dry.  Neurological:     Mental Status: He is alert and oriented to person, place, and time.  Psychiatric:        Mood and Affect: Mood and affect normal.     There were no vitals taken for this visit. Wt Readings from Last 3 Encounters:  03/05/20 240 lb (108.9 kg)  10/03/19 243 lb 9.6 oz (110.5 kg)  06/03/19 130 lb (59 kg)    Diabetic Foot Exam - Simple   No data filed    Lab Results  Component Value Date   WBC 6.2 07/09/2020   HGB 14.4 07/09/2020   HCT 43.2 07/09/2020   PLT 237.0 07/09/2020   GLUCOSE 92 07/09/2020   CHOL 175 07/09/2020   TRIG 74.0 07/09/2020   HDL 51.20 07/09/2020   LDLCALC 109 (H) 07/09/2020   ALT 17 07/09/2020   AST 19 07/09/2020   NA 138 07/09/2020   K 4.4 07/09/2020   CL 105 07/09/2020   CREATININE 1.20  07/09/2020   BUN 17 07/09/2020   CO2 26 07/09/2020   TSH 1.25 07/09/2020   PSA 0.47 04/14/2014   INR 1.13 10/27/2016   HGBA1C 5.9 07/09/2020    Lab Results  Component Value Date   TSH 1.25 07/09/2020   Lab Results  Component Value Date   WBC 6.2 07/09/2020   HGB 14.4 07/09/2020   HCT 43.2 07/09/2020   MCV 97.1 07/09/2020  PLT 237.0 07/09/2020   Lab Results  Component Value Date   NA 138 07/09/2020   K 4.4 07/09/2020   CO2 26 07/09/2020   GLUCOSE 92 07/09/2020   BUN 17 07/09/2020   CREATININE 1.20 07/09/2020   BILITOT 0.6 07/09/2020   ALKPHOS 74 07/09/2020   AST 19 07/09/2020   ALT 17 07/09/2020   PROT 6.7 07/09/2020   ALBUMIN 4.0 07/09/2020   CALCIUM 9.2 07/09/2020   ANIONGAP 11 10/27/2016   GFR 57.91 (L) 07/09/2020   Lab Results  Component Value Date   CHOL 175 07/09/2020   Lab Results  Component Value Date   HDL 51.20 07/09/2020   Lab Results  Component Value Date   LDLCALC 109 (H) 07/09/2020   Lab Results  Component Value Date   TRIG 74.0 07/09/2020   Lab Results  Component Value Date   CHOLHDL 3 07/09/2020   Lab Results  Component Value Date   HGBA1C 5.9 07/09/2020       Assessment & Plan:   Problem List Items Addressed This Visit    Essential hypertension    Well controlled, no changes to meds. Encouraged heart healthy diet such as the DASH diet and exercise as tolerated. Given flu shot today      Relevant Orders   CBC with Differential/Platelet (Completed)   Comprehensive metabolic panel (Completed)   TSH (Completed)   Hyperlipidemia, mixed    Encouraged heart healthy diet, increase exercise, avoid trans fats, consider a krill oil cap daily      Relevant Orders   Lipid panel (Completed)   Gout of foot    Hydrate and monitor uric acid      Hyperglycemia     minimize simple carbs. Increase exercise as tolerated. Continue current meds      Relevant Orders   Hemoglobin A1c (Completed)    Other Visit Diagnoses    Need for  influenza vaccination    -  Primary   Relevant Orders   Flu Vaccine QUAD High Dose(Fluad)      I am having Julen L. Alpert maintain his Multiple Vitamins-Minerals (CENTRUM SILVER ADULT 50+ PO), Misc Natural Products (OSTEO BI-FLEX ADV JOINT SHIELD PO), MAGNESIUM PO, aspirin, Probiotic Daily, tiZANidine Comfort Pac, allopurinol, amLODipine, and valsartan.  No orders of the defined types were placed in this encounter.    [name], MD

## 2020-07-12 NOTE — Assessment & Plan Note (Addendum)
Well controlled, no changes to meds. Encouraged heart healthy diet such as the DASH diet and exercise as tolerated. Given flu shot today 

## 2020-08-13 ENCOUNTER — Other Ambulatory Visit: Payer: Self-pay | Admitting: Family Medicine

## 2020-09-23 ENCOUNTER — Other Ambulatory Visit: Payer: Self-pay | Admitting: Family Medicine

## 2020-09-23 DIAGNOSIS — I10 Essential (primary) hypertension: Secondary | ICD-10-CM

## 2020-11-28 ENCOUNTER — Other Ambulatory Visit: Payer: Self-pay | Admitting: Family Medicine

## 2021-01-07 ENCOUNTER — Encounter: Payer: Self-pay | Admitting: *Deleted

## 2021-01-07 ENCOUNTER — Ambulatory Visit (INDEPENDENT_AMBULATORY_CARE_PROVIDER_SITE_OTHER): Payer: Medicare Other | Admitting: Family Medicine

## 2021-01-07 ENCOUNTER — Other Ambulatory Visit: Payer: Self-pay

## 2021-01-07 VITALS — BP 130/86 | HR 82 | Temp 98.2°F | Resp 16 | Wt 237.6 lb

## 2021-01-07 DIAGNOSIS — M109 Gout, unspecified: Secondary | ICD-10-CM

## 2021-01-07 DIAGNOSIS — R21 Rash and other nonspecific skin eruption: Secondary | ICD-10-CM | POA: Diagnosis not present

## 2021-01-07 DIAGNOSIS — I10 Essential (primary) hypertension: Secondary | ICD-10-CM | POA: Diagnosis not present

## 2021-01-07 DIAGNOSIS — R739 Hyperglycemia, unspecified: Secondary | ICD-10-CM | POA: Diagnosis not present

## 2021-01-07 DIAGNOSIS — E782 Mixed hyperlipidemia: Secondary | ICD-10-CM

## 2021-01-07 DIAGNOSIS — Z0000 Encounter for general adult medical examination without abnormal findings: Secondary | ICD-10-CM

## 2021-01-07 LAB — TSH: TSH: 1.65 u[IU]/mL (ref 0.35–5.50)

## 2021-01-07 LAB — CBC
HCT: 45 % (ref 39.0–52.0)
Hemoglobin: 15.1 g/dL (ref 13.0–17.0)
MCHC: 33.5 g/dL (ref 30.0–36.0)
MCV: 98.1 fl (ref 78.0–100.0)
Platelets: 213 10*3/uL (ref 150.0–400.0)
RBC: 4.59 Mil/uL (ref 4.22–5.81)
RDW: 13.9 % (ref 11.5–15.5)
WBC: 5.6 10*3/uL (ref 4.0–10.5)

## 2021-01-07 LAB — COMPREHENSIVE METABOLIC PANEL
ALT: 20 U/L (ref 0–53)
AST: 23 U/L (ref 0–37)
Albumin: 4 g/dL (ref 3.5–5.2)
Alkaline Phosphatase: 78 U/L (ref 39–117)
BUN: 14 mg/dL (ref 6–23)
CO2: 27 mEq/L (ref 19–32)
Calcium: 9.5 mg/dL (ref 8.4–10.5)
Chloride: 105 mEq/L (ref 96–112)
Creatinine, Ser: 1.24 mg/dL (ref 0.40–1.50)
GFR: 55.48 mL/min — ABNORMAL LOW (ref >60.00)
Glucose, Bld: 92 mg/dL (ref 70–99)
Potassium: 4.7 mEq/L (ref 3.5–5.1)
Sodium: 139 mEq/L (ref 135–145)
Total Bilirubin: 0.8 mg/dL (ref 0.2–1.2)
Total Protein: 6.8 g/dL (ref 6.0–8.3)

## 2021-01-07 LAB — LIPID PANEL
Cholesterol: 166 mg/dL (ref 0–200)
HDL: 52.3 mg/dL (ref >39.00)
LDL Cholesterol: 93 mg/dL (ref 0–99)
NonHDL: 113.51
Total CHOL/HDL Ratio: 3
Triglycerides: 103 mg/dL (ref 0.0–149.0)
VLDL: 20.6 mg/dL (ref 0.0–40.0)

## 2021-01-07 LAB — HEMOGLOBIN A1C: Hgb A1c MFr Bld: 5.9 % (ref 4.6–6.5)

## 2021-01-07 LAB — URIC ACID: Uric Acid, Serum: 5.6 mg/dL (ref 4.0–7.8)

## 2021-01-07 NOTE — Patient Instructions (Addendum)
Lamisil cream if rash persists, consider dermatology if continues to spread   Paxlovid is the new COVID medication we can give you if you get COVID so make sure you test if you have symptoms because we have to treat by day 5 of symptoms for it to be effective. If you are positive let us know so we can treat. If a home test is negative and your symptoms are persistent get a PCR test. Can check testing locations at [email or web address] If you are positive we will make an appointment with [place] and we will send in Paxlovid if you would like it. Check with your pharmacy before we meet to confirm they have it in stock, if they do not then we can get the prescription at the [facility]   Shingrix at the pharmacy  Body Ringworm Body ringworm is an infection of the skin that often causes a ring-shaped rash.Body ringworm is also called tinea corporis. Body ringworm can affect any part of your skin. This condition is easily spread from person to person (is very contagious). What are the causes? This condition is caused by fungi called dermatophytes. The condition developswhen these fungi grow out of control on the skin. You can get this condition if you touch a person or animal that has it. You can also get it if you share any items with an infected person or pet. These include: Clothing, bedding, and towels. Brushes or combs. Gym equipment. Any other object that has the fungus on it. What increases the risk? You are more likely to develop this condition if you: Play sports that involve close physical contact, such as wrestling. Sweat a lot. Live in areas that are hot and humid. Use public showers. Have a weakened immune system. What are the signs or symptoms? Symptoms of this condition include: Itchy, raised red spots and bumps. Red scaly patches. A ring-shaped rash. The rash may have: A clear center. Scales or red bumps at its center. Redness near its borders. Dry and scaly skin on  or around it. How is this diagnosed? This condition can usually be diagnosed with a skin exam. A skin scraping may be taken from the affected area and examined under a microscope to see if thefungus is present. How is this treated? This condition may be treated with: An antifungal cream or ointment. An antifungal shampoo. Antifungal medicines. These may be prescribed if your ringworm: Is severe. Keeps coming back. Lasts a long time. Follow these instructions at home: Take over-the-counter and prescription medicines only as told by your health care provider. If you were given an antifungal cream or ointment: Use it as told by your health care provider. Wash the infected area and dry it completely before applying the cream or ointment. If you were given an antifungal shampoo: Use it as told by your health care provider. Leave the shampoo on your body for 3-5 minutes before rinsing. While you have a rash: Wear loose clothing to stop clothes from rubbing and irritating it. Wash or change your bed sheets every night. Disinfect or throw out items that may be infected. Wash clothes and bed sheets in hot water. Wash your hands often with soap and water. If soap and water are not available, use hand sanitizer. If your pet has the same infection, take your pet to see a veterinarian for treatment. How is this prevented? Take a bath or shower every day and after every time you work out or play sports. Dry your skin  completely after bathing. Wear sandals or shoes in public places and showers. Change your clothes every day. Wash athletic clothes after each use. Do not share personal items with others. Avoid touching red patches of skin on other people. Avoid touching pets that have bald spots. If you touch an animal that has a bald spot, wash your hands. Contact a health care provider if: Your rash continues to spread after 7 days of treatment. Your rash is not gone in 4 weeks. The area  around your rash gets red, warm, tender, and swollen. Summary Body ringworm is an infection of the skin that often causes a ring-shaped rash. This condition is easily spread from person to person (is very contagious). This condition may be treated with antifungal cream or ointment, antifungal shampoo, or antifungal medicines. Take over-the-counter and prescription medicines only as told by your health care provider. This information is not intended to replace advice given to you by your health care provider. Make sure you discuss any questions you have with your healthcare provider. Document Revised: 02/16/2018 Document Reviewed: 02/16/2018 Elsevier Patient Education  2022 [employer].

## 2021-01-07 NOTE — Assessment & Plan Note (Signed)
Patient encouraged to maintain heart healthy diet, regular exercise, adequate sleep. Consider daily probiotics. Take medications as prescribed 

## 2021-01-07 NOTE — Assessment & Plan Note (Signed)
hgba1c acceptable, minimize simple carbs. Increase exercise as tolerated. Continue current meds 

## 2021-01-07 NOTE — Progress Notes (Signed)
Patient ID: Anthony Poole, male    DOB: 1941-03-10  Age: 80 y.o. MRN: [id number]    Subjective:  Subjective  HPI Anthony Poole presents for office visit today for follow up on htn and bug bite. He reports that while doing yard work he got bit on the lateral side of his behind right knee. He states that he initially took an antihistamine, but noticed that rash started proliferating to both his knees. As a result, he started taking lotrimin suspecting a fungal infection. He states that he has no recent hospitalization or recent ER visits.He denies CP/palp/SOB/HA/congestion/fevers/GI or GU c/o. Taking meds as prescribed.   Review of Systems  Constitutional:  Negative for chills, fatigue and fever.  HENT:  Negative for congestion, rhinorrhea, sinus pressure, sinus pain and sore throat.   Eyes:  Negative for pain.  Respiratory:  Negative for cough and shortness of breath.   Cardiovascular:  Negative for chest pain, palpitations and leg swelling.  Gastrointestinal:  Negative for abdominal pain, blood in stool, diarrhea, nausea and vomiting.  Genitourinary:  Negative for flank pain, frequency and penile pain.  Musculoskeletal:  Negative for back pain.  Skin:  Positive for rash (bilateral LE's).  Neurological:  Negative for headaches.   History Past Medical History:  Diagnosis Date   Acute pancreatitis 10/25/2016   Anemia 07/29/2012   Chronic knee pain 11/21/2012   Left s/p torn cartilage   Constipation 10/26/2016   Costochondritis 12/12/2016   Epistaxis 09/30/2016   GERD (gastroesophageal reflux disease)    Headache 04/20/2014   Hypertension    Hypocalcemia 03/16/2017   Medicare annual wellness visit, subsequent 04/14/2014   Obesity 10/20/2014   Other and unspecified hyperlipidemia 04/14/2013   Pancreatitis 10/22/2016   Preventative health care 07/29/2012   Shingles 12/12/2012   Sinusitis, acute 09/05/2012   Tachycardia 10/14/2014   Tick bite 11/19/2015    He has a past surgical history  that includes Cholecystectomy.   His family history includes COPD in his mother; Dementia in his father; Peripheral vascular disease in his brother and mother.He reports that he has never smoked. He has never used smokeless tobacco. He reports that he does not drink alcohol and does not use drugs.  Current Outpatient Medications on File Prior to Visit  Medication Sig Dispense Refill   allopurinol (ZYLOPRIM) 100 MG tablet TAKE 1 TABLET BY MOUTH EVERY DAY 90 tablet 1   amLODipine (NORVASC) 5 MG tablet TAKE 1 TABLET BY MOUTH EVERY DAY 90 tablet 1   aspirin 81 MG tablet Take 81 mg by mouth daily.      MAGNESIUM PO Take by mouth daily.     Misc Natural Products (OSTEO BI-FLEX ADV JOINT SHIELD PO) Take by mouth daily.     Multiple Vitamins-Minerals (CENTRUM SILVER ADULT 50+ PO) Take by mouth daily.     Probiotic Product (PROBIOTIC DAILY) CAPS Vear Clock Colon Health-Take 1 capsule by mouth daily.     tiZANidine-Liniment (TIZANIDINE COMFORT PAC) 4 MG MISC 1-4 mg by Combination route at bedtime as needed. 40 each 2   valsartan (DIOVAN) 40 MG tablet TAKE 1 TABLET BY MOUTH EVERY DAY 90 tablet 1   [DISCONTINUED] losartan (COZAAR) 50 MG tablet TAKE 1 TABLET BY MOUTH EVERY DAY 90 tablet 1   No current facility-administered medications on file prior to visit.     Objective:  Objective  Physical Exam Constitutional:      General: He is not in acute distress.    Appearance: Normal appearance. He  is not ill-appearing or toxic-appearing.  HENT:     Head: Normocephalic and atraumatic.     Right Ear: Tympanic membrane, ear canal and external ear normal.     Left Ear: Tympanic membrane, ear canal and external ear normal.     Nose: No congestion or rhinorrhea.  Eyes:     Extraocular Movements: Extraocular movements intact.     Pupils: Pupils are equal, round, and reactive to light.  Cardiovascular:     Rate and Rhythm: Normal rate and regular rhythm.     Pulses: Normal pulses.     Heart sounds: Normal  heart sounds. No murmur heard. Pulmonary:     Effort: Pulmonary effort is normal. No respiratory distress.     Breath sounds: Normal breath sounds. No wheezing, rhonchi or rales.  Abdominal:     General: Bowel sounds are normal.     Palpations: Abdomen is soft. There is no mass.     Tenderness: no abdominal tenderness There is no guarding.     Hernia: No hernia is present.  Musculoskeletal:        General: Normal range of motion.     Cervical back: Normal range of motion and neck supple.  Skin:    General: Skin is warm and dry.  Neurological:     Mental Status: He is alert and oriented to person, place, and time.  Psychiatric:        Behavior: Behavior normal.   BP 130/86   Pulse 82   Temp 98.2 F (36.8 C)   Resp 16   Wt 237 lb 9.6 oz (107.8 kg)   SpO2 97%   BMI 29.70 kg/m  Wt Readings from Last 3 Encounters:  01/07/21 237 lb 9.6 oz (107.8 kg)  03/05/20 240 lb (108.9 kg)  10/03/19 243 lb 9.6 oz (110.5 kg)     Lab Results  Component Value Date   WBC 5.6 01/07/2021   HGB 15.1 01/07/2021   HCT 45.0 01/07/2021   PLT 213.0 01/07/2021   GLUCOSE 92 01/07/2021   CHOL 166 01/07/2021   TRIG 103.0 01/07/2021   HDL 52.30 01/07/2021   LDLCALC 93 01/07/2021   ALT 20 01/07/2021   AST 23 01/07/2021   NA 139 01/07/2021   K 4.7 01/07/2021   CL 105 01/07/2021   CREATININE 1.24 01/07/2021   BUN 14 01/07/2021   CO2 27 01/07/2021   TSH 1.65 01/07/2021   PSA 0.47 04/14/2014   INR 1.13 10/27/2016   HGBA1C 5.9 01/07/2021    DG Lumbar Spine Complete  Result Date: 03/02/2018 CLINICAL DATA:  Back pain radiating to right leg. EXAM: LUMBAR SPINE - COMPLETE 4+ VIEW COMPARISON:  10/29/2016 FINDINGS: Slight levoscoliosis at L3-4. Right side of L5 is sacralized. Mild narrowing of the L5-S1 disc. There is advanced narrowing throughout the remainder of the lumbar spine disc levels. There is also severe narrowing at the T12-L1 disc. No vertebral compression deformity. There is facet  arthropathy at L3-4 and L4-5. Osteopenia. IMPRESSION: No acute bony pathology.  Chronic changes. Electronically Signed   By: Jolaine Click M.D.   On: 03/02/2018 10:38     Assessment & Plan:  Plan    No orders of the defined types were placed in this encounter.   Problem List Items Addressed This Visit     Essential hypertension - Primary    Well controlled, no changes to meds. Encouraged heart healthy diet such as the DASH diet and exercise as tolerated.  Relevant Orders   CBC (Completed)   Comprehensive metabolic panel (Completed)   TSH (Completed)   Preventative health care    Patient encouraged to maintain heart healthy diet, regular exercise, adequate sleep. Consider daily probiotics. Take medications as prescribed       Hyperlipidemia, mixed    Encourage heart healthy diet such as MIND or DASH diet, increase exercise, avoid trans fats, simple carbohydrates and processed foods, consider a krill or fish or flaxseed oil cap daily.        Relevant Orders   Lipid panel (Completed)   Rash    On lower legs, has had a partial response to Clotrimazole. Can try Lamisil. Notify us if worse or seek care with dermatology       Gout of foot    Hydrate and monitor       Relevant Orders   Uric acid (Completed)   Hyperglycemia    hgba1c acceptable, minimize simple carbs. Increase exercise as tolerated. Continue current meds       Relevant Orders   Hemoglobin A1c (Completed)    Follow-up: Return in about 6 months (around 07/10/2021), or 40 minute visit for CPE in medicare patient.  I, Billie Lade, acting as a scribe for [name], MD, have documented all relevent documentation on behalf of [name], MD, as directed by [name], MD while in the presence of [name], MD.  I, Bradd Canary, MD personally performed the services described in this documentation. All medical record entries made by the scribe were at my direction and in my presence. I have  reviewed the chart and agree that the record reflects my personal performance and is accurate and complete

## 2021-01-07 NOTE — Assessment & Plan Note (Signed)
Encourage heart healthy diet such as MIND or DASH diet, increase exercise, avoid trans fats, simple carbohydrates and processed foods, consider a krill or fish or flaxseed oil cap daily.  °

## 2021-01-07 NOTE — Assessment & Plan Note (Signed)
Well controlled, no changes to meds. Encouraged heart healthy diet such as the DASH diet and exercise as tolerated.  °

## 2021-01-07 NOTE — Assessment & Plan Note (Signed)
Hydrate and monitor 

## 2021-01-11 NOTE — Assessment & Plan Note (Signed)
On lower legs, has had a partial response to Clotrimazole. Can try Lamisil. Notify us if worse or seek care with dermatology

## 2021-02-01 ENCOUNTER — Ambulatory Visit (INDEPENDENT_AMBULATORY_CARE_PROVIDER_SITE_OTHER): Payer: Medicare Other

## 2021-02-01 ENCOUNTER — Other Ambulatory Visit: Payer: Self-pay | Admitting: Family Medicine

## 2021-02-01 ENCOUNTER — Telehealth: Payer: Self-pay

## 2021-02-01 VITALS — Ht 75.0 in | Wt 237.0 lb

## 2021-02-01 DIAGNOSIS — Z0000 Encounter for general adult medical examination without abnormal findings: Secondary | ICD-10-CM | POA: Diagnosis not present

## 2021-02-01 MED ORDER — FLUCONAZOLE 150 MG PO TABS: 150.0000 mg | ORAL_TABLET | ORAL | 0 refills | Status: DC

## 2021-02-01 NOTE — Progress Notes (Signed)
Subjective:   Anthony Poole is a 80 y.o. male who presents for Medicare Annual/Subsequent preventive examination.  I connected with Garey today by telephone and verified that I am speaking with the correct person using two identifiers. Location patient: home Location provider: work Persons participating in the virtual visit: patient, Engineer, civil (consulting).    I discussed the limitations, risks, security and privacy concerns of performing an evaluation and management service by telephone and the availability of in person appointments. I also discussed with the patient that there may be a patient responsible charge related to this service. The patient expressed understanding and verbally consented to this telephonic visit.    Interactive audio and video telecommunications were attempted between this provider and patient, however failed, due to patient having technical difficulties OR patient did not have access to video capability.  We continued and completed visit with audio only.  Some vital signs may be absent or patient reported.   Time Spent with patient on telephone encounter: 20 minutes   Review of Systems     Cardiac Risk Factors include: advanced age (>47men, >15 women);male gender;dyslipidemia;hypertension     Objective:    Today's Vitals   02/01/21 1301  Weight: 237 lb (107.5 kg)  Height: 6\' 3"  (1.905 m)   Body mass index is 29.62 kg/m.  Advanced Directives 02/01/2021 10/01/2019 10/27/2016 10/22/2016 10/04/2016 05/23/2016 05/20/2015  Does Patient Have a Medical Advance Directive? No Yes No No No No No  Type of Advance Directive - Healthcare Power of [place];Living will - - - - -  Does patient want to make changes to medical advance directive? No - Patient declined No - Patient declined - - - - -  Copy of Healthcare Power of Attorney in Chart? - No - copy requested - - - - -  Would patient like information on creating a medical advance directive? - - - - No - Patient declined Yes -  [place] given Yes - Educational materials given    Current Medications (verified) Outpatient Encounter Medications as of 02/01/2021  Medication Sig   allopurinol (ZYLOPRIM) 100 MG tablet TAKE 1 TABLET BY MOUTH EVERY DAY   amLODipine (NORVASC) 5 MG tablet TAKE 1 TABLET BY MOUTH EVERY DAY   aspirin 81 MG tablet Take 81 mg by mouth daily.    MAGNESIUM PO Take by mouth daily.   Misc Natural Products (OSTEO BI-FLEX ADV JOINT SHIELD PO) Take by mouth daily.   Multiple Vitamins-Minerals (CENTRUM SILVER ADULT 50+ PO) Take by mouth daily.   Probiotic Product (PROBIOTIC DAILY) CAPS 04/03/2021 Colon Health-Take 1 capsule by mouth daily.   tiZANidine-Liniment (TIZANIDINE COMFORT PAC) 4 MG MISC 1-4 mg by Combination route at bedtime as needed.   valsartan (DIOVAN) 40 MG tablet TAKE 1 TABLET BY MOUTH EVERY DAY   [DISCONTINUED] losartan (COZAAR) 50 MG tablet TAKE 1 TABLET BY MOUTH EVERY DAY   No facility-administered encounter medications on file as of 02/01/2021.    Allergies (verified) Fish allergy and Propulsid [cisapride]   History: Past Medical History:  Diagnosis Date   Acute pancreatitis 10/25/2016   Anemia 07/29/2012   Chronic knee pain 11/21/2012   Left s/p torn cartilage   Constipation 10/26/2016   Costochondritis 12/12/2016   Epistaxis 09/30/2016   GERD (gastroesophageal reflux disease)    Headache 04/20/2014   Hypertension    Hypocalcemia 03/16/2017   Medicare annual wellness visit, subsequent 04/14/2014   Obesity 10/20/2014   Other and unspecified hyperlipidemia 04/14/2013   Pancreatitis 10/22/2016  Preventative health care 07/29/2012   Shingles 12/12/2012   Sinusitis, acute 09/05/2012   Tachycardia 10/14/2014   Tick bite 11/19/2015   Past Surgical History:  Procedure Laterality Date   CHOLECYSTECTOMY     Family History  Problem Relation Age of Onset   COPD Mother        worked in [employer], clots   Peripheral vascular disease Mother    Dementia Father     Peripheral vascular disease Brother    Social History   Socioeconomic History   Marital status: Married    Spouse name: Not on file   Number of children: Not on file   Years of education: Not on file   Highest education level: Not on file  Occupational History   Not on file  Tobacco Use   Smoking status: Never   Smokeless tobacco: Never  Substance and Sexual Activity   Alcohol use: No   Drug use: No   Sexual activity: Never    Comment: lives with wife, no dietary restrictions,  no fish  Other Topics Concern   Not on file  Social History Narrative   Not on file   Social Determinants of Health   Financial Resource Strain: Low Risk    Difficulty of Paying Living Expenses: Not hard at all  Food Insecurity: No Food Insecurity   Worried About Programme researcher, broadcasting/film/video in the Last Year: Never true   Ran Out of Food in the Last Year: Never true  Transportation Needs: No Transportation Needs   Lack of Transportation (Medical): No   Lack of Transportation (Non-Medical): No  Physical Activity: Inactive   Days of Exercise per Week: 0 days   Minutes of Exercise per Session: 0 min  Stress: No Stress Concern Present   Feeling of Stress : Not at all  Social Connections: Moderately Isolated   Frequency of Communication with Friends and Family: More than three times a week   Frequency of Social Gatherings with Friends and Family: More than three times a week   Attends Religious Services: Never   Database administrator or Organizations: No   Attends Engineer, structural: Never   Marital Status: Married    Tobacco Counseling Counseling given: Not Answered   Clinical Intake:  Pre-visit preparation completed: Yes  Pain : No/denies pain     Nutritional Status: BMI 25 -29 Overweight Nutritional Risks: None Diabetes: No  How often do you need to have someone help you when you read instructions, pamphlets, or other written materials from your doctor or pharmacy?: 1 -  Never  Diabetic?No  Interpreter Needed?: No  Information entered by :: Thomasenia Sales LPN   Activities of Daily Living In your present state of health, do you have any difficulty performing the following activities: 02/01/2021 01/07/2021  Hearing? N N  Vision? N N  Difficulty concentrating or making decisions? N N  Walking or climbing stairs? N N  Dressing or bathing? N N  Doing errands, shopping? N N  Preparing Food and eating ? N -  Using the Toilet? N -  In the past six months, have you accidently leaked urine? N -  Do you have problems with loss of bowel control? N -  Managing your Medications? N -  Managing your Finances? N -  Housekeeping or managing your Housekeeping? N -  Some recent data might be hidden    Patient Care Team: Bradd Canary, MD as PCP - General (Family Medicine)  Indicate  any recent Medical Services you may have received from other than [facility] providers in the past year (date may be approximate).     Assessment:   This is a routine wellness examination for Jillyn HiddenGary.  Hearing/Vision screen Hearing Screening - Comments:: No issues Vision Screening - Comments:: Wears glasses for reading Last eye exam-2 years-Dr. Hazle Quantigby  Dietary issues and exercise activities discussed: Current Exercise Habits: The patient does not participate in regular exercise at present (pt states he does yardwork for exercise), Exercise limited by: None identified   Goals Addressed             This Visit's Progress    DIET - EAT MORE FRUITS AND VEGETABLES   On track    Patient Stated   On track    Drink more water        Depression Screen PHQ 2/9 Scores 02/01/2021 01/07/2021 10/01/2019 05/23/2016 05/20/2015 10/14/2014 04/14/2013  PHQ - 2 Score 0 0 0 0 0 0 0    Fall Risk Fall Risk  02/01/2021 01/07/2021 10/01/2019 05/28/2019 05/22/2018  Falls in the past year? 1 0 0 0 0  Comment - - - Emmi Telephone Survey: data to providers prior to load Emmi Telephone Survey: data to providers  prior to load  Number falls in past yr: 0 0 0 - -  Injury with Fall? 0 0 0 - -  Follow up Falls prevention discussed - Education provided;Falls prevention discussed - -    FALL RISK PREVENTION PERTAINING TO THE HOME:  Any stairs in or around the home? Yes  If so, are there any without handrails? No  Home free of loose throw rugs in walkways, pet beds, electrical cords, etc? Yes  Adequate lighting in your home to reduce risk of falls? Yes   ASSISTIVE DEVICES UTILIZED TO PREVENT FALLS:  Life alert? No  Use of a cane, walker or w/c? No  Grab bars in the bathroom? Yes  Shower chair or bench in shower? No  Elevated toilet seat or a handicapped toilet? No   TIMED UP AND GO:  Was the test performed? No . Phone visit   Cognitive Function:Normal cognitive status assessed by this Nurse Health Advisor. No abnormalities found.          Immunizations Immunization History  Administered Date(s) Administered   Fluad Quad(high Dose 65+) 05/23/2019, 07/09/2020   Influenza Split 04/02/2012   Influenza, High Dose Seasonal PF 04/12/2013, 05/23/2016, 03/16/2017, 03/01/2018   Influenza,inj,Quad PF,6+ Mos 04/14/2014, 05/20/2015   Moderna Sars-Covid-2 Vaccination 09/04/2019, 10/02/2019   Pneumococcal Conjugate-13 04/14/2014   Pneumococcal Polysaccharide-23 09/26/2016   Tdap 10/06/2011   Zoster, Live 10/06/2011    TDAP status: Up to date  Flu Vaccine status: Up to date  Pneumococcal vaccine status: Up to date  Covid-19 vaccine status: Information provided on how to obtain vaccines. Booster due  Qualifies for Shingles Vaccine? Yes   Zostavax completed Yes   Shingrix Completed?: No.    Education has been provided regarding the importance of this vaccine. Patient has been advised to call insurance company to determine out of pocket expense if they have not yet received this vaccine. Advised may also receive vaccine at local pharmacy or Health Dept. Verbalized acceptance and  understanding.  Screening Tests Health Maintenance  Topic Date Due   Zoster Vaccines- Shingrix (1 of 2) Never done   INFLUENZA VACCINE  02/01/2021   Hepatitis C Screening  01/07/2022 (Originally 12/21/1958)   TETANUS/TDAP  10/05/2021   PNA vac Low Risk  Adult  Completed   HPV VACCINES  Aged Out   COVID-19 Vaccine  Discontinued    Health Maintenance  Health Maintenance Due  Topic Date Due   Zoster Vaccines- Shingrix (1 of 2) Never done   INFLUENZA VACCINE  02/01/2021    Colorectal cancer screening: No longer required.   Lung Cancer Screening: (Low Dose CT Chest recommended if Age 48-80 years, 30 pack-year currently smoking OR have quit w/in 15years.) does not qualify.     Additional Screening:  Hepatitis C Screening: does not qualify  Vision Screening: Recommended annual ophthalmology exams for early detection of glaucoma and other disorders of the eye. Is the patient up to date with their annual eye exam?  No  Who is the provider or what is the name of the office in which the patient attends annual eye exams? [facility]    Dental Screening: Recommended annual dental exams for proper oral hygiene  Community Resource Referral / Chronic Care Management: CRR required this visit?  No   CCM required this visit?  No      Plan:     I have personally reviewed and noted the following in the patient's chart:   Medical and social history Use of alcohol, tobacco or illicit drugs  Current medications and supplements including opioid prescriptions. Patient is not currently taking opioid prescriptions. Functional ability and status Nutritional status Physical activity Advanced directives List of other physicians Hospitalizations, surgeries, and ER visits in previous 12 months Vitals Screenings to include cognitive, depression, and falls Referrals and appointments  In addition, I have reviewed and discussed with patient certain preventive protocols, quality  metrics, and best practice recommendations. A written personalized care plan for preventive services as well as general preventive health recommendations were provided to patient.   Due to this being a telephonic visit, the after visit summary with patients personalized plan was offered to patient via mail or my-chart. Patient would like to access on my-chart.   Roanna Raider, LPN   09/10/7671  Nurse Health Advisor  Nurse Notes: Patient is requesting a prescription medication for Ringworm. He states he showed it to Dr. Abner Greenspan at his last visit. Message sent to Dr. Abner Greenspan

## 2021-02-01 NOTE — Telephone Encounter (Signed)
Patient states he has Ringworm on both of his knees. He states he showed it to Dr. Abner Greenspan at his last office visit. He is using Terrrasil on it but it is not helping very much. He states he read online that there is a prescription you can get for it & he wants to know if he can get a script.  Does not know that name of the prescription med.

## 2021-02-01 NOTE — Patient Instructions (Signed)
Anthony Poole , Thank you for taking time to complete your Medicare Wellness Visit. I appreciate your ongoing commitment to your health goals. Please review the following plan we discussed and let me know if I can assist you in the future.   Screening recommendations/referrals: Colonoscopy: No longer required Recommended yearly ophthalmology/optometry visit for glaucoma screening and checkup Recommended yearly dental visit for hygiene and checkup  Vaccinations: Influenza vaccine: Up to date Pneumococcal vaccine: Up to date Tdap vaccine: Up to date-Due-10/05/2021 Shingles vaccine: Discuss with pharmacy   Covid-19: Booster due  Advanced directives: Declined information today  Conditions/risks identified: See problem list  Next appointment: Follow up in one year for your annual wellness visit. 02/03/2022 @ 1:00  Preventive Care 65 Years and Older, Male Preventive care refers to lifestyle choices and visits with your health care provider that can promote health and wellness. What does preventive care include? A yearly physical exam. This is also called an annual well check. Dental exams once or twice a year. Routine eye exams. Ask your health care provider how often you should have your eyes checked. Personal lifestyle choices, including: Daily care of your teeth and gums. Regular physical activity. Eating a healthy diet. Avoiding tobacco and drug use. Limiting alcohol use. Practicing safe sex. Taking low doses of aspirin every day. Taking vitamin and mineral supplements as recommended by your health care provider. What happens during an annual well check? The services and screenings done by your health care provider during your annual well check will depend on your age, overall health, lifestyle risk factors, and family history of disease. Counseling  Your health care provider may ask you questions about your: Alcohol use. Tobacco use. Drug use. Emotional well-being. Home and  relationship well-being. Sexual activity. Eating habits. History of falls. Memory and ability to understand (cognition). Work and work Astronomer. Screening  You may have the following tests or measurements: Height, weight, and BMI. Blood pressure. Lipid and cholesterol levels. These may be checked every 5 years, or more frequently if you are over 72 years old. Skin check. Lung cancer screening. You may have this screening every year starting at age 31 if you have a 30-pack-year history of smoking and currently smoke or have quit within the past 15 years. Fecal occult blood test (FOBT) of the stool. You may have this test every year starting at age 75. Flexible sigmoidoscopy or colonoscopy. You may have a sigmoidoscopy every 5 years or a colonoscopy every 10 years starting at age 42. Prostate cancer screening. Recommendations will vary depending on your family history and other risks. Hepatitis C blood test. Hepatitis B blood test. Sexually transmitted disease (STD) testing. Diabetes screening. This is done by checking your blood sugar (glucose) after you have not eaten for a while (fasting). You may have this done every 1-3 years. Abdominal aortic aneurysm (AAA) screening. You may need this if you are a current or former smoker. Osteoporosis. You may be screened starting at age 15 if you are at high risk. Talk with your health care provider about your test results, treatment options, and if necessary, the need for more tests. Vaccines  Your health care provider may recommend certain vaccines, such as: Influenza vaccine. This is recommended every year. Tetanus, diphtheria, and acellular pertussis (Tdap, Td) vaccine. You may need a Td booster every 10 years. Zoster vaccine. You may need this after age 24. Pneumococcal 13-valent conjugate (PCV13) vaccine. One dose is recommended after age 63. Pneumococcal polysaccharide (PPSV23) vaccine. One dose is  recommended after age 34. Talk to your  health care provider about which screenings and vaccines you need and how often you need them. This information is not intended to replace advice given to you by your health care provider. Make sure you discuss any questions you have with your health care provider. Document Released: 07/17/2015 Document Revised: 03/09/2016 Document Reviewed: 04/21/2015 Elsevier Interactive Patient Education  2017 [place] Prevention in the Home Falls can cause injuries. They can happen to people of all ages. There are many things you can do to make your home safe and to help prevent falls. What can I do on the outside of my home? Regularly fix the edges of walkways and driveways and fix any cracks. Remove anything that might make you trip as you walk through a door, such as a raised step or threshold. Trim any bushes or trees on the path to your home. Use bright outdoor lighting. Clear any walking paths of anything that might make someone trip, such as rocks or tools. Regularly check to see if handrails are loose or broken. Make sure that both sides of any steps have handrails. Any raised decks and porches should have guardrails on the edges. Have any leaves, snow, or ice cleared regularly. Use sand or salt on walking paths during winter. Clean up any spills in your garage right away. This includes oil or grease spills. What can I do in the bathroom? Use night lights. Install grab bars by the toilet and in the tub and shower. Do not use towel bars as grab bars. Use non-skid mats or decals in the tub or shower. If you need to sit down in the shower, use a plastic, non-slip stool. Keep the floor dry. Clean up any water that spills on the floor as soon as it happens. Remove soap buildup in the tub or shower regularly. Attach bath mats securely with double-sided non-slip rug tape. Do not have throw rugs and other things on the floor that can make you trip. What can I do in the bedroom? Use night  lights. Make sure that you have a light by your bed that is easy to reach. Do not use any sheets or blankets that are too big for your bed. They should not hang down onto the floor. Have a firm chair that has side arms. You can use this for support while you get dressed. Do not have throw rugs and other things on the floor that can make you trip. What can I do in the kitchen? Clean up any spills right away. Avoid walking on wet floors. Keep items that you use a lot in easy-to-reach places. If you need to reach something above you, use a strong step stool that has a grab bar. Keep electrical cords out of the way. Do not use floor polish or wax that makes floors slippery. If you must use wax, use non-skid floor wax. Do not have throw rugs and other things on the floor that can make you trip. What can I do with my stairs? Do not leave any items on the stairs. Make sure that there are handrails on both sides of the stairs and use them. Fix handrails that are broken or loose. Make sure that handrails are as long as the stairways. Check any carpeting to make sure that it is firmly attached to the stairs. Fix any carpet that is loose or worn. Avoid having throw rugs at the top or bottom of the stairs. If  you do have throw rugs, attach them to the floor with carpet tape. Make sure that you have a light switch at the top of the stairs and the bottom of the stairs. If you do not have them, ask someone to add them for you. What else can I do to help prevent falls? Wear shoes that: Do not have high heels. Have rubber bottoms. Are comfortable and fit you well. Are closed at the toe. Do not wear sandals. If you use a stepladder: Make sure that it is fully opened. Do not climb a closed stepladder. Make sure that both sides of the stepladder are locked into place. Ask someone to hold it for you, if possible. Clearly mark and make sure that you can see: Any grab bars or handrails. First and last  steps. Where the edge of each step is. Use tools that help you move around (mobility aids) if they are needed. These include: Canes. Walkers. Scooters. Crutches. Turn on the lights when you go into a dark area. Replace any light bulbs as soon as they burn out. Set up your furniture so you have a clear path. Avoid moving your furniture around. If any of your floors are uneven, fix them. If there are any pets around you, be aware of where they are. Review your medicines with your doctor. Some medicines can make you feel dizzy. This can increase your chance of falling. Ask your doctor what other things that you can do to help prevent falls. This information is not intended to replace advice given to you by your health care provider. Make sure you discuss any questions you have with your health care provider. Document Released: 04/16/2009 Document Revised: 11/26/2015 Document Reviewed: 07/25/2014 Elsevier Interactive Patient Education  2017 [employer].

## 2021-02-02 NOTE — Telephone Encounter (Signed)
Patient notified and he had already picked up medication.

## 2021-02-05 ENCOUNTER — Other Ambulatory Visit: Payer: Self-pay | Admitting: Family Medicine

## 2021-03-18 ENCOUNTER — Other Ambulatory Visit: Payer: Self-pay | Admitting: Family Medicine

## 2021-03-18 DIAGNOSIS — I10 Essential (primary) hypertension: Secondary | ICD-10-CM

## 2021-05-23 ENCOUNTER — Other Ambulatory Visit: Payer: Self-pay | Admitting: Family Medicine

## 2021-07-22 ENCOUNTER — Encounter: Payer: Self-pay | Admitting: Family Medicine

## 2021-07-22 ENCOUNTER — Ambulatory Visit (INDEPENDENT_AMBULATORY_CARE_PROVIDER_SITE_OTHER): Payer: Medicare Other | Admitting: Family Medicine

## 2021-07-22 VITALS — BP 122/68 | HR 66 | Temp 98.0°F | Resp 16 | Ht 75.0 in | Wt 232.6 lb

## 2021-07-22 DIAGNOSIS — E782 Mixed hyperlipidemia: Secondary | ICD-10-CM | POA: Diagnosis not present

## 2021-07-22 DIAGNOSIS — B353 Tinea pedis: Secondary | ICD-10-CM

## 2021-07-22 DIAGNOSIS — D649 Anemia, unspecified: Secondary | ICD-10-CM

## 2021-07-22 DIAGNOSIS — Z125 Encounter for screening for malignant neoplasm of prostate: Secondary | ICD-10-CM | POA: Diagnosis not present

## 2021-07-22 DIAGNOSIS — R739 Hyperglycemia, unspecified: Secondary | ICD-10-CM | POA: Diagnosis not present

## 2021-07-22 DIAGNOSIS — I10 Essential (primary) hypertension: Secondary | ICD-10-CM

## 2021-07-22 DIAGNOSIS — Z23 Encounter for immunization: Secondary | ICD-10-CM | POA: Diagnosis not present

## 2021-07-22 DIAGNOSIS — R351 Nocturia: Secondary | ICD-10-CM

## 2021-07-22 DIAGNOSIS — M109 Gout, unspecified: Secondary | ICD-10-CM | POA: Diagnosis not present

## 2021-07-22 LAB — CBC WITH DIFFERENTIAL/PLATELET
Basophils Absolute: 0.1 10*3/uL (ref 0.0–0.1)
Basophils Relative: 0.9 % (ref 0.0–3.0)
Eosinophils Absolute: 0.1 10*3/uL (ref 0.0–0.7)
Eosinophils Relative: 1 % (ref 0.0–5.0)
HCT: 42.8 % (ref 39.0–52.0)
Hemoglobin: 14.2 g/dL (ref 13.0–17.0)
Lymphocytes Relative: 22.9 % (ref 12.0–46.0)
Lymphs Abs: 1.4 10*3/uL (ref 0.7–4.0)
MCHC: 33.2 g/dL (ref 30.0–36.0)
MCV: 97.5 fl (ref 78.0–100.0)
Monocytes Absolute: 0.5 10*3/uL (ref 0.1–1.0)
Monocytes Relative: 9.1 % (ref 3.0–12.0)
Neutro Abs: 3.9 10*3/uL (ref 1.4–7.7)
Neutrophils Relative %: 66.1 % (ref 43.0–77.0)
Platelets: 243 10*3/uL (ref 150.0–400.0)
RBC: 4.39 Mil/uL (ref 4.22–5.81)
RDW: 13.9 % (ref 11.5–15.5)
WBC: 5.9 10*3/uL (ref 4.0–10.5)

## 2021-07-22 LAB — COMPREHENSIVE METABOLIC PANEL
ALT: 16 U/L (ref 0–53)
AST: 20 U/L (ref 0–37)
Albumin: 4.1 g/dL (ref 3.5–5.2)
Alkaline Phosphatase: 78 U/L (ref 39–117)
BUN: 16 mg/dL (ref 6–23)
CO2: 26 mEq/L (ref 19–32)
Calcium: 9.5 mg/dL (ref 8.4–10.5)
Chloride: 105 mEq/L (ref 96–112)
Creatinine, Ser: 1.28 mg/dL (ref 0.40–1.50)
GFR: 53.2 mL/min — ABNORMAL LOW (ref >60.00)
Glucose, Bld: 91 mg/dL (ref 70–99)
Potassium: 4.5 mEq/L (ref 3.5–5.1)
Sodium: 139 mEq/L (ref 135–145)
Total Bilirubin: 0.6 mg/dL (ref 0.2–1.2)
Total Protein: 6.9 g/dL (ref 6.0–8.3)

## 2021-07-22 LAB — LIPID PANEL
Cholesterol: 181 mg/dL (ref 0–200)
HDL: 52.5 mg/dL (ref >39.00)
LDL Cholesterol: 112 mg/dL — ABNORMAL HIGH (ref 0–99)
NonHDL: 128.29
Total CHOL/HDL Ratio: 3
Triglycerides: 82 mg/dL (ref 0.0–149.0)
VLDL: 16.4 mg/dL (ref 0.0–40.0)

## 2021-07-22 LAB — TSH: TSH: 1.91 u[IU]/mL (ref 0.35–5.50)

## 2021-07-22 LAB — HEMOGLOBIN A1C: Hgb A1c MFr Bld: 5.8 % (ref 4.6–6.5)

## 2021-07-22 LAB — PSA: PSA: 0.56 ng/mL (ref 0.10–4.00)

## 2021-07-22 MED ORDER — FLUCONAZOLE 150 MG PO TABS: 150.0000 mg | ORAL_TABLET | ORAL | 1 refills | Status: DC

## 2021-07-22 NOTE — Assessment & Plan Note (Signed)
Increase leafy greens, consider increased lean red meat and using cast iron cookware. Continue to monitor, report any concerns 

## 2021-07-22 NOTE — Patient Instructions (Addendum)
Lamisil  cream for the feet  Shingrix is the new shingles shot, 2 shots over 2-6 months, confirm coverage with insurance and document, then can return here for shots with nurse appt or at pharmacy   Preventive Care 65 Years and Older, Male Preventive care refers to lifestyle choices and visits with your health care provider that can promote health and wellness. Preventive care visits are also called wellness exams. What can I expect for my preventive care visit? Counseling During your preventive care visit, your health care provider may ask about your: Medical history, including: Past medical problems. Family medical history. History of falls. Current health, including: Emotional well-being. Home life and relationship well-being. Sexual activity. Memory and ability to understand (cognition). Lifestyle, including: Alcohol, nicotine or tobacco, and drug use. Access to firearms. Diet, exercise, and sleep habits. Work and work Astronomer. Sunscreen use. Safety issues such as seatbelt and bike helmet use. Physical exam Your health care provider will check your: Height and weight. These may be used to calculate your BMI (body mass index). BMI is a measurement that tells if you are at a healthy weight. Waist circumference. This measures the distance around your waistline. This measurement also tells if you are at a healthy weight and may help predict your risk of certain diseases, such as type 2 diabetes and high blood pressure. Heart rate and blood pressure. Body temperature. Skin for abnormal spots. What immunizations do I need? Vaccines are usually given at various ages, according to a schedule. Your health care provider will recommend vaccines for you based on your age, medical history, and lifestyle or other factors, such as travel or where you work. What tests do I need? Screening Your health care provider may recommend screening tests for certain conditions. This may  include: Lipid and cholesterol levels. Diabetes screening. This is done by checking your blood sugar (glucose) after you have not eaten for a while (fasting). Hepatitis C test. Hepatitis B test. HIV (human immunodeficiency virus) test. STI (sexually transmitted infection) testing, if you are at risk. Lung cancer screening. Colorectal cancer screening. Prostate cancer screening. Abdominal aortic aneurysm (AAA) screening. You may need this if you are a current or former smoker. Talk with your health care provider about your test results, treatment options, and if necessary, the need for more tests. Follow these instructions at home: Eating and drinking  Eat a diet that includes fresh fruits and vegetables, whole grains, lean protein, and low-fat dairy products. Limit your intake of foods with high amounts of sugar, saturated fats, and salt. Take vitamin and mineral supplements as recommended by your health care provider. Do not drink alcohol if your health care provider tells you not to drink. If you drink alcohol: Limit how much you have to 0-2 drinks a day. Know how much alcohol is in your drink. In the [place] one drink equals one 12 oz bottle of beer (355 mL), one 5 oz glass of wine (148 mL), or one 1 oz glass of hard liquor (44 mL). Lifestyle Brush your teeth every morning and night with fluoride toothpaste. Floss one time each day. Exercise for at least 30 minutes 5 or more days each week. Do not use any products that contain nicotine or tobacco. These products include cigarettes, chewing tobacco, and vaping devices, such as e-cigarettes. If you need help quitting, ask your health care provider. Do not use drugs. If you are sexually active, practice safe sex. Use a condom or other form of protection to  prevent STIs. Take aspirin only as told by your health care provider. Make sure that you understand how much to take and what form to take. Work with your health care provider to find out  whether it is safe and beneficial for you to take aspirin daily. Ask your health care provider if you need to take a cholesterol-lowering medicine (statin). Find healthy ways to manage stress, such as: Meditation, yoga, or listening to music. Journaling. Talking to a trusted person. Spending time with friends and family. Safety Always wear your seat belt while driving or riding in a vehicle. Do not drive: If you have been drinking alcohol. Do not ride with someone who has been drinking. When you are tired or distracted. While texting. If you have been using any mind-altering substances or drugs. Wear a helmet and other protective equipment during sports activities. If you have firearms in your house, make sure you follow all gun safety procedures. Minimize exposure to UV radiation to reduce your risk of skin cancer. What's next? Visit your health care provider once a year for an annual wellness visit. Ask your health care provider how often you should have your eyes and teeth checked. Stay up to date on all vaccines. This information is not intended to replace advice given to you by your health care provider. Make sure you discuss any questions you have with your health care provider. Document Revised: 12/16/2020 Document Reviewed: 12/16/2020 Elsevier Patient Education  2022 [employer].

## 2021-07-22 NOTE — Assessment & Plan Note (Signed)
Well controlled, no changes to meds. Encouraged heart healthy diet such as the DASH diet and exercise as tolerated.  °

## 2021-07-22 NOTE — Assessment & Plan Note (Signed)
Encourage heart healthy diet such as MIND or DASH diet, increase exercise, avoid trans fats, simple carbohydrates and processed foods, consider a krill or fish or flaxseed oil cap daily.  °

## 2021-07-22 NOTE — Assessment & Plan Note (Signed)
Hydrate and monitor 

## 2021-07-23 DIAGNOSIS — B353 Tinea pedis: Secondary | ICD-10-CM | POA: Insufficient documentation

## 2021-07-23 HISTORY — DX: Tinea pedis: B35.3

## 2021-07-23 NOTE — Progress Notes (Signed)
Subjective:    Patient ID: Anthony Poole, male    DOB: 1941/05/20, 81 y.o.   MRN: [id number]  Chief Complaint  Patient presents with   Annual Exam    Spots on feet    HPI Patient is in today for follow up on chronic medical concerns. No recent febrile illness or hospitalizations. He is doing well. His only concern is a recurrent rash on both of his feet. He had a partial response to 2 doses of Diflucan and Clotrimazole but the rashes are flaring now. He has 3 small spots on his right foot around his ankle and one bit one on the top of his left foot. He is trying to eat well and stay active. Denies CP/palp/SOB/HA/congestion/fevers/GI or GU c/o. Taking meds as prescribed   Past Medical History:  Diagnosis Date   Acute pancreatitis 10/25/2016   Anemia 07/29/2012   Chronic knee pain 11/21/2012   Left s/p torn cartilage   Constipation 10/26/2016   Costochondritis 12/12/2016   Epistaxis 09/30/2016   GERD (gastroesophageal reflux disease)    Headache 04/20/2014   Hypertension    Hypocalcemia 03/16/2017   Medicare annual wellness visit, subsequent 04/14/2014   Obesity 10/20/2014   Other and unspecified hyperlipidemia 04/14/2013   Pancreatitis 10/22/2016   Preventative health care 07/29/2012   Shingles 12/12/2012   Sinusitis, acute 09/05/2012   Tachycardia 10/14/2014   Tick bite 11/19/2015    Past Surgical History:  Procedure Laterality Date   CHOLECYSTECTOMY      Family History  Problem Relation Age of Onset   COPD Mother        worked in [employer], clots   Peripheral vascular disease Mother    Dementia Father    Peripheral vascular disease Brother     Social History   Socioeconomic History   Marital status: Married    Spouse name: Not on file   Number of children: Not on file   Years of education: Not on file   Highest education level: Not on file  Occupational History   Not on file  Tobacco Use   Smoking status: Never   Smokeless tobacco: Never  Substance and Sexual  Activity   Alcohol use: No   Drug use: No   Sexual activity: Never    Comment: lives with wife, no dietary restrictions,  no fish  Other Topics Concern   Not on file  Social History Narrative   Not on file   Social Determinants of Health   Financial Resource Strain: Low Risk    Difficulty of Paying Living Expenses: Not hard at all  Food Insecurity: No Food Insecurity   Worried About Charity fundraiser in the Last Year: Never true   Arboriculturist in the Last Year: Never true  Transportation Needs: No Transportation Needs   Lack of Transportation (Medical): No   Lack of Transportation (Non-Medical): No  Physical Activity: Inactive   Days of Exercise per Week: 0 days   Minutes of Exercise per Session: 0 min  Stress: No Stress Concern Present   Feeling of Stress : Not at all  Social Connections: Moderately Isolated   Frequency of Communication with Friends and Family: More than three times a week   Frequency of Social Gatherings with Friends and Family: More than three times a week   Attends Religious Services: Never   Marine scientist or Organizations: No   Attends Archivist Meetings: Never   Marital Status: Married  Intimate Partner Violence: Not At Risk   Fear of Current or Ex-Partner: No   Emotionally Abused: No   Physically Abused: No   Sexually Abused: No    Outpatient Medications Prior to Visit  Medication Sig Dispense Refill   allopurinol (ZYLOPRIM) 100 MG tablet TAKE 1 TABLET BY MOUTH EVERY DAY 90 tablet 1   amLODipine (NORVASC) 5 MG tablet TAKE 1 TABLET BY MOUTH EVERY DAY 90 tablet 1   aspirin 81 MG tablet Take 81 mg by mouth daily.      MAGNESIUM PO Take by mouth daily.     Misc Natural Products (OSTEO BI-FLEX ADV JOINT SHIELD PO) Take by mouth daily.     Multiple Vitamins-Minerals (CENTRUM SILVER ADULT 50+ PO) Take by mouth daily.     Probiotic Product (PROBIOTIC DAILY) CAPS Hardin Negus Colon Health-Take 1 capsule by mouth daily.     valsartan  (DIOVAN) 40 MG tablet TAKE 1 TABLET BY MOUTH EVERY DAY 90 tablet 1   fluconazole (DIFLUCAN) 150 MG tablet Take 1 tablet (150 mg total) by mouth once a week. 2 tablet 0   tiZANidine-Liniment (TIZANIDINE COMFORT PAC) 4 MG MISC 1-4 mg by Combination route at bedtime as needed. 40 each 2   No facility-administered medications prior to visit.    Allergies  Allergen Reactions   Fish Allergy Itching   Propulsid [Cisapride] Hives    Review of Systems  Constitutional:  Negative for chills, fever and malaise/fatigue.  HENT:  Negative for congestion and hearing loss.   Eyes:  Negative for discharge.  Respiratory:  Negative for cough, sputum production and shortness of breath.   Cardiovascular:  Negative for chest pain, palpitations and leg swelling.  Gastrointestinal:  Negative for abdominal pain, blood in stool, constipation, diarrhea, heartburn, nausea and vomiting.  Genitourinary:  Negative for dysuria, frequency, hematuria and urgency.  Musculoskeletal:  Negative for back pain, falls and myalgias.  Skin:  Positive for itching and rash.  Neurological:  Negative for dizziness, sensory change, loss of consciousness, weakness and headaches.  Endo/Heme/Allergies:  Negative for environmental allergies. Does not bruise/bleed easily.  Psychiatric/Behavioral:  Negative for depression and suicidal ideas. The patient is not nervous/anxious and does not have insomnia.       Objective:    Physical Exam Constitutional:      General: He is not in acute distress.    Appearance: Normal appearance. He is not ill-appearing or toxic-appearing.  HENT:     Head: Normocephalic and atraumatic.     Right Ear: External ear normal.     Left Ear: External ear normal.     Nose: Nose normal.  Eyes:     General:        Right eye: No discharge.        Left eye: No discharge.  Cardiovascular:     Rate and Rhythm: Normal rate and regular rhythm.     Pulses: Normal pulses.     Heart sounds: No murmur  heard. Pulmonary:     Effort: Pulmonary effort is normal.     Breath sounds: Normal breath sounds. No wheezing.  Abdominal:     General: Bowel sounds are normal. There is no distension.     Palpations: Abdomen is soft. There is no mass.     Tenderness: There is no abdominal tenderness. There is no guarding.  Musculoskeletal:     Cervical back: Normal range of motion. No tenderness.     Right lower leg: No edema.     Left  lower leg: No edema.  Skin:    General: Skin is warm and dry.     Findings: Rash present.     Comments: Caudal aspect of left foot large macular lesion about 3-4 cm in diameter, it is erythematous and very slightly raised. 3 smaller lesions around ankle of right foot.   Neurological:     Mental Status: He is alert and oriented to person, place, and time.     Cranial Nerves: No cranial nerve deficit.  Psychiatric:        Behavior: Behavior normal.    BP 122/68    Pulse 66    Temp 98 F (36.7 C)    Resp 16    Ht 6\' 3"  (1.905 m)    Wt 232 lb 9.6 oz (105.5 kg)    SpO2 97%    BMI 29.07 kg/m  Wt Readings from Last 3 Encounters:  07/22/21 232 lb 9.6 oz (105.5 kg)  02/01/21 237 lb (107.5 kg)  01/07/21 237 lb 9.6 oz (107.8 kg)    Diabetic Foot Exam - Simple   No data filed    Lab Results  Component Value Date   WBC 5.9 07/22/2021   HGB 14.2 07/22/2021   HCT 42.8 07/22/2021   PLT 243.0 07/22/2021   GLUCOSE 91 07/22/2021   CHOL 181 07/22/2021   TRIG 82.0 07/22/2021   HDL 52.50 07/22/2021   LDLCALC 112 (H) 07/22/2021   ALT 16 07/22/2021   AST 20 07/22/2021   NA 139 07/22/2021   K 4.5 07/22/2021   CL 105 07/22/2021   CREATININE 1.28 07/22/2021   BUN 16 07/22/2021   CO2 26 07/22/2021   TSH 1.91 07/22/2021   PSA 0.56 07/22/2021   INR 1.13 10/27/2016   HGBA1C 5.8 07/22/2021    Lab Results  Component Value Date   TSH 1.91 07/22/2021   Lab Results  Component Value Date   WBC 5.9 07/22/2021   HGB 14.2 07/22/2021   HCT 42.8 07/22/2021   MCV 97.5  07/22/2021   PLT 243.0 07/22/2021   Lab Results  Component Value Date   NA 139 07/22/2021   K 4.5 07/22/2021   CO2 26 07/22/2021   GLUCOSE 91 07/22/2021   BUN 16 07/22/2021   CREATININE 1.28 07/22/2021   BILITOT 0.6 07/22/2021   ALKPHOS 78 07/22/2021   AST 20 07/22/2021   ALT 16 07/22/2021   PROT 6.9 07/22/2021   ALBUMIN 4.1 07/22/2021   CALCIUM 9.5 07/22/2021   ANIONGAP 11 10/27/2016   GFR 53.20 (L) 07/22/2021   Lab Results  Component Value Date   CHOL 181 07/22/2021   Lab Results  Component Value Date   HDL 52.50 07/22/2021   Lab Results  Component Value Date   LDLCALC 112 (H) 07/22/2021   Lab Results  Component Value Date   TRIG 82.0 07/22/2021   Lab Results  Component Value Date   CHOLHDL 3 07/22/2021   Lab Results  Component Value Date   HGBA1C 5.8 07/22/2021       Assessment & Plan:   Problem List Items Addressed This Visit     Essential hypertension    Well controlled, no changes to meds. Encouraged heart healthy diet such as the DASH diet and exercise as tolerated.       Relevant Orders   CBC with Differential/Platelet (Completed)   Comprehensive metabolic panel (Completed)   TSH (Completed)   Lipid panel (Completed)   Anemia    Increase leafy greens, consider  increased lean red meat and using cast iron cookware. Continue to monitor, report any concerns      Hyperlipidemia, mixed    Encourage heart healthy diet such as MIND or DASH diet, increase exercise, avoid trans fats, simple carbohydrates and processed foods, consider a krill or fish or flaxseed oil cap daily.       Relevant Orders   CBC with Differential/Platelet (Completed)   Comprehensive metabolic panel (Completed)   TSH (Completed)   Lipid panel (Completed)   Gout of foot    Hydrate and monitor      Hyperglycemia    hgba1c acceptable, minimize simple carbs. Increase exercise as tolerated.       Relevant Orders   Hemoglobin A1c (Completed)   Tinea pedis    Had a  partial response to Diflucan 2 doses but this is flared again. Refill with 4 doses. 1 dose weekly x 4. He has been using Clotrimazole will have him switch to Lamisil. If no response he agrees to a referral to dermatology. He is advised to use antifungal powder in shoes and to change his white socks twice a day.       Relevant Medications   fluconazole (DIFLUCAN) 150 MG tablet   Other Visit Diagnoses     Need for influenza vaccination    -  Primary   Relevant Orders   Flu Vaccine QUAD High Dose(Fluad) (Completed)   Prostate cancer screening       Nocturia       Relevant Orders   PSA (Completed)       I have discontinued Dominica Severin L. Azpeitia's tiZANidine Comfort Pac. I am also having him maintain his Multiple Vitamins-Minerals (CENTRUM SILVER ADULT 50+ PO), Misc Natural Products (OSTEO BI-FLEX ADV JOINT SHIELD PO), MAGNESIUM PO, aspirin, Probiotic Daily, allopurinol, amLODipine, valsartan, and fluconazole.  Meds ordered this encounter  Medications   fluconazole (DIFLUCAN) 150 MG tablet    Sig: Take 1 tablet (150 mg total) by mouth once a week.    Dispense:  4 tablet    Refill:  1     Penni Homans, MD

## 2021-07-23 NOTE — Assessment & Plan Note (Signed)
hgba1c acceptable, minimize simple carbs. Increase exercise as tolerated.  

## 2021-07-23 NOTE — Assessment & Plan Note (Signed)
Had a partial response to Diflucan 2 doses but this is flared again. Refill with 4 doses. 1 dose weekly x 4. He has been using Clotrimazole will have him switch to Lamisil. If no response he agrees to a referral to dermatology. He is advised to use antifungal powder in shoes and to change his white socks twice a day.

## 2021-07-29 DIAGNOSIS — H02831 Dermatochalasis of right upper eyelid: Secondary | ICD-10-CM | POA: Diagnosis not present

## 2021-07-29 DIAGNOSIS — H5203 Hypermetropia, bilateral: Secondary | ICD-10-CM | POA: Diagnosis not present

## 2021-07-29 DIAGNOSIS — H02834 Dermatochalasis of left upper eyelid: Secondary | ICD-10-CM | POA: Diagnosis not present

## 2021-07-29 DIAGNOSIS — H53042 Amblyopia suspect, left eye: Secondary | ICD-10-CM | POA: Diagnosis not present

## 2021-07-29 DIAGNOSIS — H2513 Age-related nuclear cataract, bilateral: Secondary | ICD-10-CM | POA: Diagnosis not present

## 2021-07-29 DIAGNOSIS — H52223 Regular astigmatism, bilateral: Secondary | ICD-10-CM | POA: Diagnosis not present

## 2021-07-29 DIAGNOSIS — H524 Presbyopia: Secondary | ICD-10-CM | POA: Diagnosis not present

## 2021-07-30 ENCOUNTER — Other Ambulatory Visit: Payer: Self-pay | Admitting: Family Medicine

## 2021-09-24 ENCOUNTER — Other Ambulatory Visit: Payer: Self-pay | Admitting: Family Medicine

## 2021-09-24 DIAGNOSIS — I10 Essential (primary) hypertension: Secondary | ICD-10-CM

## 2021-09-26 ENCOUNTER — Encounter: Payer: Self-pay | Admitting: Family Medicine

## 2021-09-27 ENCOUNTER — Other Ambulatory Visit: Payer: Self-pay | Admitting: Family Medicine

## 2021-09-27 DIAGNOSIS — R21 Rash and other nonspecific skin eruption: Secondary | ICD-10-CM

## 2021-09-29 DIAGNOSIS — L814 Other melanin hyperpigmentation: Secondary | ICD-10-CM | POA: Diagnosis not present

## 2021-09-29 DIAGNOSIS — L309 Dermatitis, unspecified: Secondary | ICD-10-CM | POA: Diagnosis not present

## 2021-09-29 DIAGNOSIS — L821 Other seborrheic keratosis: Secondary | ICD-10-CM | POA: Diagnosis not present

## 2021-09-29 DIAGNOSIS — L720 Epidermal cyst: Secondary | ICD-10-CM | POA: Diagnosis not present

## 2021-10-28 DIAGNOSIS — L308 Other specified dermatitis: Secondary | ICD-10-CM | POA: Diagnosis not present

## 2021-11-16 ENCOUNTER — Other Ambulatory Visit: Payer: Self-pay | Admitting: Family Medicine

## 2021-12-31 ENCOUNTER — Other Ambulatory Visit: Payer: Self-pay | Admitting: Family Medicine

## 2022-01-19 NOTE — Progress Notes (Deleted)
Subjective:    Patient ID: Anthony Poole, male    DOB: 09/02/1940, 81 y.o.   MRN: [id number]  No chief complaint on file.   HPI Patient is in today for a follow up.  Past Medical History:  Diagnosis Date   Acute pancreatitis 10/25/2016   Anemia 07/29/2012   Chronic knee pain 11/21/2012   Left s/p torn cartilage   Constipation 10/26/2016   Costochondritis 12/12/2016   Epistaxis 09/30/2016   GERD (gastroesophageal reflux disease)    Headache 04/20/2014   Hypertension    Hypocalcemia 03/16/2017   Medicare annual wellness visit, subsequent 04/14/2014   Obesity 10/20/2014   Other and unspecified hyperlipidemia 04/14/2013   Pancreatitis 10/22/2016   Preventative health care 07/29/2012   Shingles 12/12/2012   Sinusitis, acute 09/05/2012   Tachycardia 10/14/2014   Tick bite 11/19/2015    Past Surgical History:  Procedure Laterality Date   CHOLECYSTECTOMY      Family History  Problem Relation Age of Onset   COPD Mother        worked in [employer], clots   Peripheral vascular disease Mother    Dementia Father    Peripheral vascular disease Brother     Social History   Socioeconomic History   Marital status: Married    Spouse name: Not on file   Number of children: Not on file   Years of education: Not on file   Highest education level: Not on file  Occupational History   Not on file  Tobacco Use   Smoking status: Never   Smokeless tobacco: Never  Substance and Sexual Activity   Alcohol use: No   Drug use: No   Sexual activity: Never    Comment: lives with wife, no dietary restrictions,  no fish  Other Topics Concern   Not on file  Social History Narrative   Not on file   Social Determinants of Health   Financial Resource Strain: Low Risk  (02/01/2021)   Overall Financial Resource Strain (CARDIA)    Difficulty of Paying Living Expenses: Not hard at all  Food Insecurity: No Food Insecurity (02/01/2021)   Hunger Vital Sign    Worried About Running Out of Food in  the Last Year: Never true    Ran Out of Food in the Last Year: Never true  Transportation Needs: No Transportation Needs (02/01/2021)   PRAPARE - Administrator, Civil Service (Medical): No    Lack of Transportation (Non-Medical): No  Physical Activity: Inactive (02/01/2021)   Exercise Vital Sign    Days of Exercise per Week: 0 days    Minutes of Exercise per Session: 0 min  Stress: No Stress Concern Present (02/01/2021)   [name] of Occupational Health - Occupational Stress Questionnaire    Feeling of Stress : Not at all  Social Connections: Moderately Isolated (02/01/2021)   Social Connection and Isolation Panel [NHANES]    Frequency of Communication with Friends and Family: More than three times a week    Frequency of Social Gatherings with Friends and Family: More than three times a week    Attends Religious Services: Never    Database administrator or Organizations: No    Attends Banker Meetings: Never    Marital Status: Married  Catering manager Violence: Not At Risk (02/01/2021)   Humiliation, Afraid, Rape, and Kick questionnaire    Fear of Current or Ex-Partner: No    Emotionally Abused: No    Physically Abused:  No    Sexually Abused: No    Outpatient Medications Prior to Visit  Medication Sig Dispense Refill   allopurinol (ZYLOPRIM) 100 MG tablet TAKE 1 TABLET BY MOUTH EVERY DAY 90 tablet 0   amLODipine (NORVASC) 5 MG tablet TAKE 1 TABLET BY MOUTH EVERY DAY 90 tablet 1   aspirin 81 MG tablet Take 81 mg by mouth daily.      fluconazole (DIFLUCAN) 150 MG tablet Take 1 tablet (150 mg total) by mouth once a week. 4 tablet 1   MAGNESIUM PO Take by mouth daily.     Misc Natural Products (OSTEO BI-FLEX ADV JOINT SHIELD PO) Take by mouth daily.     Multiple Vitamins-Minerals (CENTRUM SILVER ADULT 50+ PO) Take by mouth daily.     Probiotic Product (PROBIOTIC DAILY) CAPS [employer] Colon Health-Take 1 capsule by mouth daily.     valsartan (DIOVAN) 40 MG  tablet TAKE 1 TABLET BY MOUTH EVERY DAY 90 tablet 1   No facility-administered medications prior to visit.    Allergies  Allergen Reactions   Fish Allergy Itching   Propulsid [Cisapride] Hives    ROS     Objective:    Physical Exam  There were no vitals taken for this visit. Wt Readings from Last 3 Encounters:  07/22/21 232 lb 9.6 oz (105.5 kg)  02/01/21 237 lb (107.5 kg)  01/07/21 237 lb 9.6 oz (107.8 kg)    Diabetic Foot Exam - Simple   No data filed    Lab Results  Component Value Date   WBC 5.9 07/22/2021   HGB 14.2 07/22/2021   HCT 42.8 07/22/2021   PLT 243.0 07/22/2021   GLUCOSE 91 07/22/2021   CHOL 181 07/22/2021   TRIG 82.0 07/22/2021   HDL 52.50 07/22/2021   LDLCALC 112 (H) 07/22/2021   ALT 16 07/22/2021   AST 20 07/22/2021   NA 139 07/22/2021   K 4.5 07/22/2021   CL 105 07/22/2021   CREATININE 1.28 07/22/2021   BUN 16 07/22/2021   CO2 26 07/22/2021   TSH 1.91 07/22/2021   PSA 0.56 07/22/2021   INR 1.13 10/27/2016   HGBA1C 5.8 07/22/2021    Lab Results  Component Value Date   TSH 1.91 07/22/2021   Lab Results  Component Value Date   WBC 5.9 07/22/2021   HGB 14.2 07/22/2021   HCT 42.8 07/22/2021   MCV 97.5 07/22/2021   PLT 243.0 07/22/2021   Lab Results  Component Value Date   NA 139 07/22/2021   K 4.5 07/22/2021   CO2 26 07/22/2021   GLUCOSE 91 07/22/2021   BUN 16 07/22/2021   CREATININE 1.28 07/22/2021   BILITOT 0.6 07/22/2021   ALKPHOS 78 07/22/2021   AST 20 07/22/2021   ALT 16 07/22/2021   PROT 6.9 07/22/2021   ALBUMIN 4.1 07/22/2021   CALCIUM 9.5 07/22/2021   ANIONGAP 11 10/27/2016   GFR 53.20 (L) 07/22/2021   Lab Results  Component Value Date   CHOL 181 07/22/2021   Lab Results  Component Value Date   HDL 52.50 07/22/2021   Lab Results  Component Value Date   LDLCALC 112 (H) 07/22/2021   Lab Results  Component Value Date   TRIG 82.0 07/22/2021   Lab Results  Component Value Date   CHOLHDL 3 07/22/2021    Lab Results  Component Value Date   HGBA1C 5.8 07/22/2021       Assessment & Plan:      Problem List Items Addressed  This Visit   None   I am having Anthony Poole maintain his Multiple Vitamins-Minerals (CENTRUM SILVER ADULT 50+ PO), Misc Natural Products (OSTEO BI-FLEX ADV JOINT SHIELD PO), MAGNESIUM PO, aspirin, Probiotic Daily, fluconazole, amLODipine, valsartan, and allopurinol.  No orders of the defined types were placed in this encounter.

## 2022-01-20 ENCOUNTER — Ambulatory Visit (INDEPENDENT_AMBULATORY_CARE_PROVIDER_SITE_OTHER): Payer: Medicare Other | Admitting: Family Medicine

## 2022-01-20 ENCOUNTER — Encounter: Payer: Self-pay | Admitting: Family Medicine

## 2022-01-20 VITALS — BP 140/80 | HR 45 | Resp 20 | Ht 75.0 in | Wt 236.2 lb

## 2022-01-20 DIAGNOSIS — M109 Gout, unspecified: Secondary | ICD-10-CM | POA: Diagnosis not present

## 2022-01-20 DIAGNOSIS — R739 Hyperglycemia, unspecified: Secondary | ICD-10-CM

## 2022-01-20 DIAGNOSIS — T7840XD Allergy, unspecified, subsequent encounter: Secondary | ICD-10-CM | POA: Diagnosis not present

## 2022-01-20 DIAGNOSIS — G459 Transient cerebral ischemic attack, unspecified: Secondary | ICD-10-CM

## 2022-01-20 DIAGNOSIS — I10 Essential (primary) hypertension: Secondary | ICD-10-CM

## 2022-01-20 DIAGNOSIS — E782 Mixed hyperlipidemia: Secondary | ICD-10-CM

## 2022-01-20 NOTE — Patient Instructions (Addendum)
Yerba Matte tea do not drink   RSV immunization in fall  Shingrix is the new shingles shot, 2 shots over 2-6 months, confirm coverage with insurance and document, then can return here for shots with nurse appt or at pharmacy   Tetanus at pharmacy or if you get hurt  Hypertension, Adult High blood pressure (hypertension) is when the force of blood pumping through the arteries is too strong. The arteries are the blood vessels that carry blood from the heart throughout the body. Hypertension forces the heart to work harder to pump blood and may cause arteries to become narrow or stiff. Untreated or uncontrolled hypertension can lead to a heart attack, heart failure, a stroke, kidney disease, and other problems. A blood pressure reading consists of a higher number over a lower number. Ideally, your blood pressure should be below 120/80. The first ("top") number is called the systolic pressure. It is a measure of the pressure in your arteries as your heart beats. The second ("bottom") number is called the diastolic pressure. It is a measure of the pressure in your arteries as the heart relaxes. What are the causes? The exact cause of this condition is not known. There are some conditions that result in high blood pressure. What increases the risk? Certain factors may make you more likely to develop high blood pressure. Some of these risk factors are under your control, including: Smoking. Not getting enough exercise or physical activity. Being overweight. Having too much fat, sugar, calories, or salt (sodium) in your diet. Drinking too much alcohol. Other risk factors include: Having a personal history of heart disease, diabetes, high cholesterol, or kidney disease. Stress. Having a family history of high blood pressure and high cholesterol. Having obstructive sleep apnea. Age. The risk increases with age. What are the signs or symptoms? High blood pressure may not cause symptoms. Very high  blood pressure (hypertensive crisis) may cause: Headache. Fast or irregular heartbeats (palpitations). Shortness of breath. Nosebleed. Nausea and vomiting. Vision changes. Severe chest pain, dizziness, and seizures. How is this diagnosed? This condition is diagnosed by measuring your blood pressure while you are seated, with your arm resting on a flat surface, your legs uncrossed, and your feet flat on the floor. The cuff of the blood pressure monitor will be placed directly against the skin of your upper arm at the level of your heart. Blood pressure should be measured at least twice using the same arm. Certain conditions can cause a difference in blood pressure between your right and left arms. If you have a high blood pressure reading during one visit or you have normal blood pressure with other risk factors, you may be asked to: Return on a different day to have your blood pressure checked again. Monitor your blood pressure at home for 1 week or longer. If you are diagnosed with hypertension, you may have other blood or imaging tests to help your health care provider understand your overall risk for other conditions. How is this treated? This condition is treated by making healthy lifestyle changes, such as eating healthy foods, exercising more, and reducing your alcohol intake. You may be referred for counseling on a healthy diet and physical activity. Your health care provider may prescribe medicine if lifestyle changes are not enough to get your blood pressure under control and if: Your systolic blood pressure is above 130. Your diastolic blood pressure is above 80. Your personal target blood pressure may vary depending on your medical conditions, your age, and  other factors. Follow these instructions at home: Eating and drinking  Eat a diet that is high in fiber and potassium, and low in sodium, added sugar, and fat. An example of this eating plan is called the DASH diet. DASH stands  for Dietary Approaches to Stop Hypertension. To eat this way: Eat plenty of fresh fruits and vegetables. Try to fill one half of your plate at each meal with fruits and vegetables. Eat whole grains, such as whole-wheat pasta, brown rice, or whole-grain bread. Fill about one fourth of your plate with whole grains. Eat or drink low-fat dairy products, such as skim milk or low-fat yogurt. Avoid fatty cuts of meat, processed or cured meats, and poultry with skin. Fill about one fourth of your plate with lean proteins, such as fish, chicken without skin, beans, eggs, or tofu. Avoid pre-made and processed foods. These tend to be higher in sodium, added sugar, and fat. Reduce your daily sodium intake. Many people with hypertension should eat less than 1,500 mg of sodium a day. Do not drink alcohol if: Your health care provider tells you not to drink. You are pregnant, may be pregnant, or are planning to become pregnant. If you drink alcohol: Limit how much you have to: 0-1 drink a day for women. 0-2 drinks a day for men. Know how much alcohol is in your drink. In the [place] one drink equals one 12 oz bottle of beer (355 mL), one 5 oz glass of wine (148 mL), or one 1 oz glass of hard liquor (44 mL). Lifestyle  Work with your health care provider to maintain a healthy body weight or to lose weight. Ask what an ideal weight is for you. Get at least 30 minutes of exercise that causes your heart to beat faster (aerobic exercise) most days of the week. Activities may include walking, swimming, or biking. Include exercise to strengthen your muscles (resistance exercise), such as Pilates or lifting weights, as part of your weekly exercise routine. Try to do these types of exercises for 30 minutes at least 3 days a week. Do not use any products that contain nicotine or tobacco. These products include cigarettes, chewing tobacco, and vaping devices, such as e-cigarettes. If you need help quitting, ask your health  care provider. Monitor your blood pressure at home as told by your health care provider. Keep all follow-up visits. This is important. Medicines Take over-the-counter and prescription medicines only as told by your health care provider. Follow directions carefully. Blood pressure medicines must be taken as prescribed. Do not skip doses of blood pressure medicine. Doing this puts you at risk for problems and can make the medicine less effective. Ask your health care provider about side effects or reactions to medicines that you should watch for. Contact a health care provider if you: Think you are having a reaction to a medicine you are taking. Have headaches that keep coming back (recurring). Feel dizzy. Have swelling in your ankles. Have trouble with your vision. Get help right away if you: Develop a severe headache or confusion. Have unusual weakness or numbness. Feel faint. Have severe pain in your chest or abdomen. Vomit repeatedly. Have trouble breathing. These symptoms may be an emergency. Get help right away. Call 911. Do not wait to see if the symptoms will go away. Do not drive yourself to the hospital. Summary Hypertension is when the force of blood pumping through your arteries is too strong. If this condition is not controlled, it may put you  at risk for serious complications. Your personal target blood pressure may vary depending on your medical conditions, your age, and other factors. For most people, a normal blood pressure is less than 120/80. Hypertension is treated with lifestyle changes, medicines, or a combination of both. Lifestyle changes include losing weight, eating a healthy, low-sodium diet, exercising more, and limiting alcohol. This information is not intended to replace advice given to you by your health care provider. Make sure you discuss any questions you have with your health care provider. Document Revised: 04/27/2021 Document Reviewed:  04/27/2021 Elsevier Patient Education  2023 [employer].

## 2022-01-20 NOTE — Progress Notes (Signed)
Subjective:   By signing my name below, I, [name], attest that this documentation has been prepared under the direction and in the presence of Bradd Canary, MD 01/20/2022.   Patient ID: Anthony Poole, male    DOB: 1941/04/22, 81 y.o.   MRN: [id number]  Chief Complaint  Patient presents with   Follow-up   HPI Patient is in today for an office visit.  He denies having any fever, new muscle pain, joint pain, congestion, sinus pain, sore throat, chest pain, palpitations at this time.   Ear pain: He is complaining about ear aches. He states that he has been taking Benadryl and Tylenol to manage the pain.    Memory: He reports that he has been taking Prevagen to help maintain his memory.  Family history: He reports that his daughter has recently been diagnosed with breast cancer.   Immunizations: He has not taken the shingles immunizations. He is UTD on his pneumonia immunizations. He has been informed about receiving the RSV and COVID-19 immunizations in the fall. He is due for a tetanus immunization.   Past Medical History:  Diagnosis Date   Acute pancreatitis 10/25/2016   Anemia 07/29/2012   Chronic knee pain 11/21/2012   Left s/p torn cartilage   Constipation 10/26/2016   Costochondritis 12/12/2016   Epistaxis 09/30/2016   GERD (gastroesophageal reflux disease)    Headache 04/20/2014   Hypertension    Hypocalcemia 03/16/2017   Medicare annual wellness visit, subsequent 04/14/2014   Obesity 10/20/2014   Other and unspecified hyperlipidemia 04/14/2013   Pancreatitis 10/22/2016   Preventative health care 07/29/2012   Shingles 12/12/2012   Sinusitis, acute 09/05/2012   Tachycardia 10/14/2014   Tick bite 11/19/2015   Past Surgical History:  Procedure Laterality Date   CHOLECYSTECTOMY     Family History  Problem Relation Age of Onset   COPD Mother        worked in [employer], clots   Peripheral vascular disease Mother    Dementia Father    Peripheral vascular disease  Brother    Social History   Socioeconomic History   Marital status: Married    Spouse name: Not on file   Number of children: Not on file   Years of education: Not on file   Highest education level: Not on file  Occupational History   Not on file  Tobacco Use   Smoking status: Never   Smokeless tobacco: Never  Substance and Sexual Activity   Alcohol use: No   Drug use: No   Sexual activity: Never    Comment: lives with wife, no dietary restrictions,  no fish  Other Topics Concern   Not on file  Social History Narrative   Not on file   Social Determinants of Health   Financial Resource Strain: Low Risk  (02/01/2021)   Overall Financial Resource Strain (CARDIA)    Difficulty of Paying Living Expenses: Not hard at all  Food Insecurity: No Food Insecurity (02/01/2021)   Hunger Vital Sign    Worried About Running Out of Food in the Last Year: Never true    Ran Out of Food in the Last Year: Never true  Transportation Needs: No Transportation Needs (02/01/2021)   PRAPARE - Administrator, Civil Service (Medical): No    Lack of Transportation (Non-Medical): No  Physical Activity: Inactive (02/01/2021)   Exercise Vital Sign    Days of Exercise per Week: 0 days    Minutes of Exercise  per Session: 0 min  Stress: No Stress Concern Present (02/01/2021)   [name] of Occupational Health - Occupational Stress Questionnaire    Feeling of Stress : Not at all  Social Connections: Moderately Isolated (02/01/2021)   Social Connection and Isolation Panel [NHANES]    Frequency of Communication with Friends and Family: More than three times a week    Frequency of Social Gatherings with Friends and Family: More than three times a week    Attends Religious Services: Never    Database administrator or Organizations: No    Attends Banker Meetings: Never    Marital Status: Married  Catering manager Violence: Not At Risk (02/01/2021)   Humiliation, Afraid, Rape, and Kick  questionnaire    Fear of Current or Ex-Partner: No    Emotionally Abused: No    Physically Abused: No    Sexually Abused: No   Outpatient Medications Prior to Visit  Medication Sig Dispense Refill   allopurinol (ZYLOPRIM) 100 MG tablet TAKE 1 TABLET BY MOUTH EVERY DAY 90 tablet 0   amLODipine (NORVASC) 5 MG tablet TAKE 1 TABLET BY MOUTH EVERY DAY 90 tablet 1   aspirin 81 MG tablet Take 81 mg by mouth daily.      fluconazole (DIFLUCAN) 150 MG tablet Take 1 tablet (150 mg total) by mouth once a week. 4 tablet 1   MAGNESIUM PO Take by mouth daily.     Misc Natural Products (OSTEO BI-FLEX ADV JOINT SHIELD PO) Take by mouth daily.     Multiple Vitamins-Minerals (CENTRUM SILVER ADULT 50+ PO) Take by mouth daily.     Probiotic Product (PROBIOTIC DAILY) CAPS Vear Clock Colon Health-Take 1 capsule by mouth daily.     valsartan (DIOVAN) 40 MG tablet TAKE 1 TABLET BY MOUTH EVERY DAY 90 tablet 1   No facility-administered medications prior to visit.   Allergies  Allergen Reactions   Fish Allergy Itching   Propulsid [Cisapride] Hives   Review of Systems  Constitutional:  Negative for fever.  HENT:  Negative for congestion, ear pain, sinus pain and sore throat.   Cardiovascular:  Negative for chest pain and palpitations.  Musculoskeletal:  Negative for joint pain and myalgias.      Objective:    Physical Exam Constitutional:      General: He is not in acute distress.    Appearance: Normal appearance. He is not ill-appearing.  HENT:     Head: Normocephalic and atraumatic.     Right Ear: Tympanic membrane, ear canal and external ear normal.     Left Ear: Tympanic membrane, ear canal and external ear normal.  Eyes:     Extraocular Movements: Extraocular movements intact.     Pupils: Pupils are equal, round, and reactive to light.  Cardiovascular:     Rate and Rhythm: Normal rate and regular rhythm.     Pulses: Normal pulses.     Heart sounds: Normal heart sounds. No murmur heard.    No  gallop.  Pulmonary:     Effort: Pulmonary effort is normal. No respiratory distress.     Breath sounds: Normal breath sounds. No wheezing or rales.  Abdominal:     General: Bowel sounds are normal.  Skin:    General: Skin is warm and dry.  Neurological:     Mental Status: He is alert and oriented to person, place, and time.  Psychiatric:        Mood and Affect: Mood normal.  Behavior: Behavior normal.        Judgment: Judgment normal.    BP 140/80 (BP Location: Left Arm, Patient Position: Sitting, Cuff Size: Normal)   Pulse (!) 45   Resp 20   Ht 6\' 3"  (1.905 m)   Wt 236 lb 3.2 oz (107.1 kg)   SpO2 97%   BMI 29.52 kg/m  Wt Readings from Last 3 Encounters:  01/20/22 236 lb 3.2 oz (107.1 kg)  07/22/21 232 lb 9.6 oz (105.5 kg)  02/01/21 237 lb (107.5 kg)   Diabetic Foot Exam - Simple   No data filed    Lab Results  Component Value Date   WBC 5.9 07/22/2021   HGB 14.2 07/22/2021   HCT 42.8 07/22/2021   PLT 243.0 07/22/2021   GLUCOSE 90 01/20/2022   CHOL 178 01/20/2022   TRIG 94.0 01/20/2022   HDL 50.50 01/20/2022   LDLCALC 109 (H) 01/20/2022   ALT 17 01/20/2022   AST 22 01/20/2022   NA 138 01/20/2022   K 4.2 01/20/2022   CL 105 01/20/2022   CREATININE 1.34 01/20/2022   BUN 18 01/20/2022   CO2 24 01/20/2022   TSH 2.45 01/20/2022   PSA 0.56 07/22/2021   INR 1.13 10/27/2016   HGBA1C 5.8 01/20/2022   Lab Results  Component Value Date   TSH 2.45 01/20/2022   Lab Results  Component Value Date   WBC 5.9 07/22/2021   HGB 14.2 07/22/2021   HCT 42.8 07/22/2021   MCV 97.5 07/22/2021   PLT 243.0 07/22/2021   Lab Results  Component Value Date   NA 138 01/20/2022   K 4.2 01/20/2022   CO2 24 01/20/2022   GLUCOSE 90 01/20/2022   BUN 18 01/20/2022   CREATININE 1.34 01/20/2022   BILITOT 0.5 01/20/2022   ALKPHOS 75 01/20/2022   AST 22 01/20/2022   ALT 17 01/20/2022   PROT 6.8 01/20/2022   ALBUMIN 4.1 01/20/2022   CALCIUM 9.1 01/20/2022   ANIONGAP 11  10/27/2016   GFR 50.18 (L) 01/20/2022   Lab Results  Component Value Date   CHOL 178 01/20/2022   Lab Results  Component Value Date   HDL 50.50 01/20/2022   Lab Results  Component Value Date   LDLCALC 109 (H) 01/20/2022   Lab Results  Component Value Date   TRIG 94.0 01/20/2022   Lab Results  Component Value Date   CHOLHDL 4 01/20/2022   Lab Results  Component Value Date   HGBA1C 5.8 01/20/2022     Assessment & Plan:   Problem List Items Addressed This Visit     Essential hypertension - Primary    Well controlled, no changes to meds. Encouraged heart healthy diet such as the DASH diet and exercise as tolerated.       Relevant Orders   Comprehensive metabolic panel (Completed)   CBC with Differential/Platelet   TIA (transient ischemic attack)    No recurrent episodes. He has started Prevagen      Hyperlipidemia, mixed    Encourage heart healthy diet such as MIND or DASH diet, increase exercise, avoid trans fats, simple carbohydrates and processed foods, consider a krill or fish or flaxseed oil cap daily.       Relevant Orders   TSH (Completed)   Lipid panel (Completed)   Allergy    He notes having trouble roughly 1 x yearly with right ear pain when his allergies kick up and he has experiencing some discomfort recently but it is improving. On  exam ear is clear. He will report if worsens. Consider Flonase, Zyrtec daily as needed.       Gout of foot    Hydrate and monitor      Relevant Orders   Uric acid (Completed)   Hyperglycemia    hgba1c acceptable, minimize simple carbs. Increase exercise as tolerated.       Relevant Orders   Hemoglobin A1c (Completed)   No orders of the defined types were placed in this encounter.  I, [name], MD, personally preformed the services described in this documentation.  All medical record entries made by the scribe were at my direction and in my presence.  I have reviewed the chart and discharge instructions (if  applicable) and agree that the record reflects my personal performance and is accurate and complete. 01/20/2022.  I,Mohammed Iqbal,acting as a scribe for [name], MD.,have documented all relevant documentation on the behalf of [name], MD,as directed by  [name], MD while in the presence of [name], MD.  [name], MD

## 2022-01-21 LAB — COMPREHENSIVE METABOLIC PANEL
ALT: 17 U/L (ref 0–53)
AST: 22 U/L (ref 0–37)
Albumin: 4.1 g/dL (ref 3.5–5.2)
Alkaline Phosphatase: 75 U/L (ref 39–117)
BUN: 18 mg/dL (ref 6–23)
CO2: 24 mEq/L (ref 19–32)
Calcium: 9.1 mg/dL (ref 8.4–10.5)
Chloride: 105 mEq/L (ref 96–112)
Creatinine, Ser: 1.34 mg/dL (ref 0.40–1.50)
GFR: 50.18 mL/min — ABNORMAL LOW (ref >60.00)
Glucose, Bld: 90 mg/dL (ref 70–99)
Potassium: 4.2 mEq/L (ref 3.5–5.1)
Sodium: 138 mEq/L (ref 135–145)
Total Bilirubin: 0.5 mg/dL (ref 0.2–1.2)
Total Protein: 6.8 g/dL (ref 6.0–8.3)

## 2022-01-21 LAB — URIC ACID: Uric Acid, Serum: 6.1 mg/dL (ref 4.0–7.8)

## 2022-01-21 LAB — LIPID PANEL
Cholesterol: 178 mg/dL (ref 0–200)
HDL: 50.5 mg/dL (ref >39.00)
LDL Cholesterol: 109 mg/dL — ABNORMAL HIGH (ref 0–99)
NonHDL: 127.65
Total CHOL/HDL Ratio: 4
Triglycerides: 94 mg/dL (ref 0.0–149.0)
VLDL: 18.8 mg/dL (ref 0.0–40.0)

## 2022-01-21 LAB — TSH: TSH: 2.45 u[IU]/mL (ref 0.35–5.50)

## 2022-01-21 LAB — HEMOGLOBIN A1C: Hgb A1c MFr Bld: 5.8 % (ref 4.6–6.5)

## 2022-01-21 NOTE — Assessment & Plan Note (Signed)
No recurrent episodes. He has started [employer]

## 2022-01-21 NOTE — Assessment & Plan Note (Signed)
He notes having trouble roughly 1 x yearly with right ear pain when his allergies kick up and he has experiencing some discomfort recently but it is improving. On exam ear is clear. He will report if worsens. Consider Flonase, Zyrtec daily as needed.

## 2022-01-21 NOTE — Assessment & Plan Note (Signed)
Hydrate and monitor 

## 2022-01-21 NOTE — Assessment & Plan Note (Signed)
hgba1c acceptable, minimize simple carbs. Increase exercise as tolerated.  

## 2022-01-21 NOTE — Assessment & Plan Note (Signed)
Encourage heart healthy diet such as MIND or DASH diet, increase exercise, avoid trans fats, simple carbohydrates and processed foods, consider a krill or fish or flaxseed oil cap daily.  °

## 2022-01-21 NOTE — Assessment & Plan Note (Signed)
Well controlled, no changes to meds. Encouraged heart healthy diet such as the DASH diet and exercise as tolerated.  °

## 2022-02-03 ENCOUNTER — Ambulatory Visit: Payer: Medicare Other

## 2022-02-04 ENCOUNTER — Ambulatory Visit (INDEPENDENT_AMBULATORY_CARE_PROVIDER_SITE_OTHER): Payer: Medicare Other

## 2022-02-04 DIAGNOSIS — Z0000 Encounter for general adult medical examination without abnormal findings: Secondary | ICD-10-CM | POA: Diagnosis not present

## 2022-02-04 NOTE — Patient Instructions (Signed)
Anthony Poole , Thank you for taking time to come for your Medicare Wellness Visit. I appreciate your ongoing commitment to your health goals. Please review the following plan we discussed and let me know if I can assist you in the future.   Screening recommendations/referrals: Colonoscopy: no longer needed Recommended yearly ophthalmology/optometry visit for glaucoma screening and checkup Recommended yearly dental visit for hygiene and checkup  Vaccinations: Influenza vaccine: up to date Pneumococcal vaccine: up to date Tdap vaccine: Due-May obtain vaccine at your local pharmacy.  Shingles vaccine: Due-May obtain vaccine at your local pharmacy.    Covid-19: Due-May obtain vaccine at your local pharmacy.   Advanced directives: yes, not on file  Conditions/risks identified: see problem list   Next appointment: Follow up in one year for your annual wellness visit.   Preventive Care 48 Years and Older, Male Preventive care refers to lifestyle choices and visits with your health care provider that can promote health and wellness. What does preventive care include? A yearly physical exam. This is also called an annual well check. Dental exams once or twice a year. Routine eye exams. Ask your health care provider how often you should have your eyes checked. Personal lifestyle choices, including: Daily care of your teeth and gums. Regular physical activity. Eating a healthy diet. Avoiding tobacco and drug use. Limiting alcohol use. Practicing safe sex. Taking low doses of aspirin every day. Taking vitamin and mineral supplements as recommended by your health care provider. What happens during an annual well check? The services and screenings done by your health care provider during your annual well check will depend on your age, overall health, lifestyle risk factors, and family history of disease. Counseling  Your health care provider may ask you questions about your: Alcohol  use. Tobacco use. Drug use. Emotional well-being. Home and relationship well-being. Sexual activity. Eating habits. History of falls. Memory and ability to understand (cognition). Work and work Astronomer. Screening  You may have the following tests or measurements: Height, weight, and BMI. Blood pressure. Lipid and cholesterol levels. These may be checked every 5 years, or more frequently if you are over 46 years old. Skin check. Lung cancer screening. You may have this screening every year starting at age 68 if you have a 30-pack-year history of smoking and currently smoke or have quit within the past 15 years. Fecal occult blood test (FOBT) of the stool. You may have this test every year starting at age 52. Flexible sigmoidoscopy or colonoscopy. You may have a sigmoidoscopy every 5 years or a colonoscopy every 10 years starting at age 30. Prostate cancer screening. Recommendations will vary depending on your family history and other risks. Hepatitis C blood test. Hepatitis B blood test. Sexually transmitted disease (STD) testing. Diabetes screening. This is done by checking your blood sugar (glucose) after you have not eaten for a while (fasting). You may have this done every 1-3 years. Abdominal aortic aneurysm (AAA) screening. You may need this if you are a current or former smoker. Osteoporosis. You may be screened starting at age 29 if you are at high risk. Talk with your health care provider about your test results, treatment options, and if necessary, the need for more tests. Vaccines  Your health care provider may recommend certain vaccines, such as: Influenza vaccine. This is recommended every year. Tetanus, diphtheria, and acellular pertussis (Tdap, Td) vaccine. You may need a Td booster every 10 years. Zoster vaccine. You may need this after age 26. Pneumococcal 13-valent  conjugate (PCV13) vaccine. One dose is recommended after age 43. Pneumococcal polysaccharide  (PPSV23) vaccine. One dose is recommended after age 17. Talk to your health care provider about which screenings and vaccines you need and how often you need them. This information is not intended to replace advice given to you by your health care provider. Make sure you discuss any questions you have with your health care provider. Document Released: 07/17/2015 Document Revised: 03/09/2016 Document Reviewed: 04/21/2015 Elsevier Interactive Patient Education  2017 [employer] Prevention in the Home Falls can cause injuries. They can happen to people of all ages. There are many things you can do to make your home safe and to help prevent falls. What can I do on the outside of my home? Regularly fix the edges of walkways and driveways and fix any cracks. Remove anything that might make you trip as you walk through a door, such as a raised step or threshold. Trim any bushes or trees on the path to your home. Use bright outdoor lighting. Clear any walking paths of anything that might make someone trip, such as rocks or tools. Regularly check to see if handrails are loose or broken. Make sure that both sides of any steps have handrails. Any raised decks and porches should have guardrails on the edges. Have any leaves, snow, or ice cleared regularly. Use sand or salt on walking paths during winter. Clean up any spills in your garage right away. This includes oil or grease spills. What can I do in the bathroom? Use night lights. Install grab bars by the toilet and in the tub and shower. Do not use towel bars as grab bars. Use non-skid mats or decals in the tub or shower. If you need to sit down in the shower, use a plastic, non-slip stool. Keep the floor dry. Clean up any water that spills on the floor as soon as it happens. Remove soap buildup in the tub or shower regularly. Attach bath mats securely with double-sided non-slip rug tape. Do not have throw rugs and other things on the  floor that can make you trip. What can I do in the bedroom? Use night lights. Make sure that you have a light by your bed that is easy to reach. Do not use any sheets or blankets that are too big for your bed. They should not hang down onto the floor. Have a firm chair that has side arms. You can use this for support while you get dressed. Do not have throw rugs and other things on the floor that can make you trip. What can I do in the kitchen? Clean up any spills right away. Avoid walking on wet floors. Keep items that you use a lot in easy-to-reach places. If you need to reach something above you, use a strong step stool that has a grab bar. Keep electrical cords out of the way. Do not use floor polish or wax that makes floors slippery. If you must use wax, use non-skid floor wax. Do not have throw rugs and other things on the floor that can make you trip. What can I do with my stairs? Do not leave any items on the stairs. Make sure that there are handrails on both sides of the stairs and use them. Fix handrails that are broken or loose. Make sure that handrails are as long as the stairways. Check any carpeting to make sure that it is firmly attached to the stairs. Fix any carpet that  is loose or worn. Avoid having throw rugs at the top or bottom of the stairs. If you do have throw rugs, attach them to the floor with carpet tape. Make sure that you have a light switch at the top of the stairs and the bottom of the stairs. If you do not have them, ask someone to add them for you. What else can I do to help prevent falls? Wear shoes that: Do not have high heels. Have rubber bottoms. Are comfortable and fit you well. Are closed at the toe. Do not wear sandals. If you use a stepladder: Make sure that it is fully opened. Do not climb a closed stepladder. Make sure that both sides of the stepladder are locked into place. Ask someone to hold it for you, if possible. Clearly mark and make  sure that you can see: Any grab bars or handrails. First and last steps. Where the edge of each step is. Use tools that help you move around (mobility aids) if they are needed. These include: Canes. Walkers. Scooters. Crutches. Turn on the lights when you go into a dark area. Replace any light bulbs as soon as they burn out. Set up your furniture so you have a clear path. Avoid moving your furniture around. If any of your floors are uneven, fix them. If there are any pets around you, be aware of where they are. Review your medicines with your doctor. Some medicines can make you feel dizzy. This can increase your chance of falling. Ask your doctor what other things that you can do to help prevent falls. This information is not intended to replace advice given to you by your health care provider. Make sure you discuss any questions you have with your health care provider. Document Released: 04/16/2009 Document Revised: 11/26/2015 Document Reviewed: 07/25/2014 Elsevier Interactive Patient Education  2017 [employer].

## 2022-02-04 NOTE — Progress Notes (Signed)
Subjective:   Anthony Poole is a 81 y.o. male who presents for Medicare Annual/Subsequent preventive examination.  I connected with  Amadeo Garnet on 02/04/22 by a audio enabled telemedicine application and verified that I am speaking with the correct person using two identifiers.  Patient Location: Home  Provider Location: Office/Clinic  I discussed the limitations of evaluation and management by telemedicine. The patient expressed understanding and agreed to proceed.   Review of Systems     Cardiac Risk Factors include: advanced age (>78men, >63 women);hypertension;dyslipidemia     Objective:    There were no vitals filed for this visit. There is no height or weight on file to calculate BMI.     02/04/2022    3:15 PM 02/01/2021    1:06 PM 10/01/2019    3:04 PM 10/27/2016    1:34 PM 10/22/2016   11:55 AM 10/04/2016   10:55 AM 05/23/2016   11:53 AM  Advanced Directives  Does Patient Have a Medical Advance Directive? Yes No Yes No No No No  Type of Estate agent of [place];Living will  Healthcare Power of [place];Living will      Does patient want to make changes to medical advance directive? No - Patient declined No - Patient declined No - Patient declined      Copy of Healthcare Power of Attorney in Chart? No - copy requested  No - copy requested      Would patient like information on creating a medical advance directive?      No - Patient declined Yes - Educational materials given    Current Medications (verified) Outpatient Encounter Medications as of 02/04/2022  Medication Sig   allopurinol (ZYLOPRIM) 100 MG tablet TAKE 1 TABLET BY MOUTH EVERY DAY   amLODipine (NORVASC) 5 MG tablet TAKE 1 TABLET BY MOUTH EVERY DAY   aspirin 81 MG tablet Take 81 mg by mouth daily.    fluconazole (DIFLUCAN) 150 MG tablet Take 1 tablet (150 mg total) by mouth once a week.   MAGNESIUM PO Take by mouth daily.   Misc Natural Products (OSTEO BI-FLEX ADV JOINT SHIELD PO)  Take by mouth daily.   Multiple Vitamins-Minerals (CENTRUM SILVER ADULT 50+ PO) Take by mouth daily.   Probiotic Product (PROBIOTIC DAILY) CAPS Vear Clock Colon Health-Take 1 capsule by mouth daily.   valsartan (DIOVAN) 40 MG tablet TAKE 1 TABLET BY MOUTH EVERY DAY   [DISCONTINUED] losartan (COZAAR) 50 MG tablet TAKE 1 TABLET BY MOUTH EVERY DAY   No facility-administered encounter medications on file as of 02/04/2022.    Allergies (verified) Fish allergy and Propulsid [cisapride]   History: Past Medical History:  Diagnosis Date   Acute pancreatitis 10/25/2016   Anemia 07/29/2012   Chronic knee pain 11/21/2012   Left s/p torn cartilage   Constipation 10/26/2016   Costochondritis 12/12/2016   Epistaxis 09/30/2016   GERD (gastroesophageal reflux disease)    Headache 04/20/2014   Hypertension    Hypocalcemia 03/16/2017   Medicare annual wellness visit, subsequent 04/14/2014   Obesity 10/20/2014   Other and unspecified hyperlipidemia 04/14/2013   Pancreatitis 10/22/2016   Preventative health care 07/29/2012   Shingles 12/12/2012   Sinusitis, acute 09/05/2012   Tachycardia 10/14/2014   Tick bite 11/19/2015   Past Surgical History:  Procedure Laterality Date   CHOLECYSTECTOMY     Family History  Problem Relation Age of Onset   COPD Mother        worked in [employer], clots  Peripheral vascular disease Mother    Dementia Father    Peripheral vascular disease Brother    Social History   Socioeconomic History   Marital status: Married    Spouse name: Not on file   Number of children: Not on file   Years of education: Not on file   Highest education level: Not on file  Occupational History   Not on file  Tobacco Use   Smoking status: Never   Smokeless tobacco: Never  Substance and Sexual Activity   Alcohol use: No   Drug use: No   Sexual activity: Never    Comment: lives with wife, no dietary restrictions,  no fish  Other Topics Concern   Not on file  Social History Narrative    Not on file   Social Determinants of Health   Financial Resource Strain: Low Risk  (02/01/2021)   Overall Financial Resource Strain (CARDIA)    Difficulty of Paying Living Expenses: Not hard at all  Food Insecurity: No Food Insecurity (02/01/2021)   Hunger Vital Sign    Worried About Running Out of Food in the Last Year: Never true    Ran Out of Food in the Last Year: Never true  Transportation Needs: No Transportation Needs (02/01/2021)   PRAPARE - Administrator, Civil Service (Medical): No    Lack of Transportation (Non-Medical): No  Physical Activity: Inactive (02/01/2021)   Exercise Vital Sign    Days of Exercise per Week: 0 days    Minutes of Exercise per Session: 0 min  Stress: No Stress Concern Present (02/01/2021)   [name] of Occupational Health - Occupational Stress Questionnaire    Feeling of Stress : Not at all  Social Connections: Moderately Isolated (02/01/2021)   Social Connection and Isolation Panel [NHANES]    Frequency of Communication with Friends and Family: More than three times a week    Frequency of Social Gatherings with Friends and Family: More than three times a week    Attends Religious Services: Never    Database administrator or Organizations: No    Attends Engineer, structural: Never    Marital Status: Married    Tobacco Counseling Counseling given: Not Answered   Clinical Intake:  Pre-visit preparation completed: Yes  Pain : No/denies pain     BMI - recorded: 29.52 Nutritional Status: BMI 25 -29 Overweight Nutritional Risks: None Diabetes: No  How often do you need to have someone help you when you read instructions, pamphlets, or other written materials from your doctor or pharmacy?: 1 - Never  Diabetic?no  Interpreter Needed?: No  Information entered by :: Jamond Neels   Activities of Daily Living    02/04/2022    3:17 PM  In your present state of health, do you have any difficulty performing the  following activities:  Hearing? 0  Vision? 0  Difficulty concentrating or making decisions? 0  Walking or climbing stairs? 0  Dressing or bathing? 0  Doing errands, shopping? 0  Preparing Food and eating ? N  Using the Toilet? N  In the past six months, have you accidently leaked urine? N  Do you have problems with loss of bowel control? N  Managing your Medications? N  Managing your Finances? N  Housekeeping or managing your Housekeeping? N    Patient Care Team: Bradd Canary, MD as PCP - General (Family Medicine)  Indicate any recent Medical Services you may have received from other than [facility]  providers in the past year (date may be approximate).     Assessment:   This is a routine wellness examination for Larell.  Hearing/Vision screen No results found.  Dietary issues and exercise activities discussed: Current Exercise Habits: Home exercise routine, Type of exercise: walking;stretching, Time (Minutes): 60, Frequency (Times/Week): 7, Weekly Exercise (Minutes/Week): 420, Intensity: Mild, Exercise limited by: None identified   Goals Addressed             This Visit's Progress    DIET - EAT MORE FRUITS AND VEGETABLES   On track    Patient Stated   On track    Drink more water        Depression Screen    02/04/2022    3:16 PM 01/20/2022    2:34 PM 07/22/2021    3:15 PM 02/01/2021    1:10 PM 01/07/2021    9:53 AM 10/01/2019    3:09 PM 05/23/2016   11:07 AM  PHQ 2/9 Scores  PHQ - 2 Score 0 0 0 0 0 0 0  PHQ- 9 Score   0        Fall Risk    02/04/2022    3:16 PM 01/20/2022    2:34 PM 02/01/2021    1:09 PM 01/07/2021    9:53 AM 10/01/2019    3:08 PM  Fall Risk   Falls in the past year? 0 0 1 0 0  Number falls in past yr: 0 0 0 0 0  Injury with Fall? 0 0 0 0 0  Risk for fall due to : No Fall Risks No Fall Risks     Follow up Falls evaluation completed Falls evaluation completed Falls prevention discussed  Education provided;Falls prevention discussed    FALL RISK  PREVENTION PERTAINING TO THE HOME:  Any stairs in or around the home? Yes  If so, are there any without handrails? No  Home free of loose throw rugs in walkways, pet beds, electrical cords, etc? Yes  Adequate lighting in your home to reduce risk of falls? Yes   ASSISTIVE DEVICES UTILIZED TO PREVENT FALLS:  Life alert? Yes  Use of a cane, walker or w/c? No  Grab bars in the bathroom? Yes  Shower chair or bench in shower? Yes  Elevated toilet seat or a handicapped toilet? No   TIMED UP AND GO:  Was the test performed? No .    Cognitive Function:        02/04/2022    3:21 PM  6CIT Screen  What Year? 0 points  What month? 0 points  What time? 0 points  Count back from 20 0 points  Months in reverse 0 points  Repeat phrase 0 points  Total Score 0 points    Immunizations Immunization History  Administered Date(s) Administered   Fluad Quad(high Dose 65+) 05/23/2019, 07/09/2020, 07/22/2021   Influenza Split 04/02/2012   Influenza, High Dose Seasonal PF 04/12/2013, 05/23/2016, 03/16/2017, 03/01/2018   Influenza,inj,Quad PF,6+ Mos 04/14/2014, 05/20/2015   Moderna Sars-Covid-2 Vaccination 09/04/2019, 10/02/2019   Pneumococcal Conjugate-13 04/14/2014   Pneumococcal Polysaccharide-23 09/26/2016   Tdap 10/06/2011   Zoster, Live 10/06/2011    TDAP status: Due, Education has been provided regarding the importance of this vaccine. Advised may receive this vaccine at local pharmacy or Health Dept. Aware to provide a copy of the vaccination record if obtained from local pharmacy or Health Dept. Verbalized acceptance and understanding.  Flu Vaccine status: Up to date  Pneumococcal vaccine status: Up to  date  Covid-19 vaccine status: Declined, Education has been provided regarding the importance of this vaccine but patient still declined. Advised may receive this vaccine at local pharmacy or Health Dept.or vaccine clinic. Aware to provide a copy of the vaccination record if obtained  from local pharmacy or Health Dept. Verbalized acceptance and understanding.  Qualifies for Shingles Vaccine? Yes   Zostavax completed No   Shingrix Completed?: No.    Education has been provided regarding the importance of this vaccine. Patient has been advised to call insurance company to determine out of pocket expense if they have not yet received this vaccine. Advised may also receive vaccine at local pharmacy or Health Dept. Verbalized acceptance and understanding.  Screening Tests Health Maintenance  Topic Date Due   Zoster Vaccines- Shingrix (1 of 2) Never done   TETANUS/TDAP  10/05/2021   INFLUENZA VACCINE  02/01/2022   Pneumonia Vaccine 66+ Years old  Completed   HPV VACCINES  Aged Out   COVID-19 Vaccine  Discontinued    Health Maintenance  Health Maintenance Due  Topic Date Due   Zoster Vaccines- Shingrix (1 of 2) Never done   TETANUS/TDAP  10/05/2021   INFLUENZA VACCINE  02/01/2022    Colorectal cancer screening: No longer required.   Lung Cancer Screening: (Low Dose CT Chest recommended if Age 62-80 years, 30 pack-year currently smoking OR have quit w/in 15years.) does not qualify.   Lung Cancer Screening Referral: n/a  Additional Screening:  Hepatitis C Screening: does not qualify; Completed aged out  Vision Screening: Recommended annual ophthalmology exams for early detection of glaucoma and other disorders of the eye. Is the patient up to date with their annual eye exam?  Yes  Who is the provider or what is the name of the office in which the patient attends annual eye exams? [facility] If pt is not established with a provider, would they like to be referred to a provider to establish care? No .   Dental Screening: Recommended annual dental exams for proper oral hygiene  Community Resource Referral / Chronic Care Management: CRR required this visit?  No   CCM required this visit?  No      Plan:     I have personally reviewed and noted the following in  the patient's chart:   Medical and social history Use of alcohol, tobacco or illicit drugs  Current medications and supplements including opioid prescriptions. Patient is not currently taking opioid prescriptions. Functional ability and status Nutritional status Physical activity Advanced directives List of other physicians Hospitalizations, surgeries, and ER visits in previous 12 months Vitals Screenings to include cognitive, depression, and falls Referrals and appointments  In addition, I have reviewed and discussed with patient certain preventive protocols, quality metrics, and best practice recommendations. A written personalized care plan for preventive services as well as general preventive health recommendations were provided to patient.   Due to this being a telephonic visit, the after visit summary with patients personalized plan was offered to patient via mail or my-chart. Patient would like to access on my-chart.    Salomon Mast Nkechi Linehan, CMA   02/04/2022   Nurse Notes: none

## 2022-03-19 ENCOUNTER — Other Ambulatory Visit: Payer: Self-pay | Admitting: Family Medicine

## 2022-03-19 DIAGNOSIS — I10 Essential (primary) hypertension: Secondary | ICD-10-CM

## 2022-03-22 ENCOUNTER — Telehealth: Payer: Self-pay | Admitting: Family Medicine

## 2022-03-22 NOTE — Telephone Encounter (Signed)
[facility] called because the patient needs a tooth extraction and they need an ok to stop the patient's blood thinners for 3 days. She stated the ok can be faxed to [phone]. Please advise.

## 2022-03-23 NOTE — Telephone Encounter (Signed)
Letter fax to dentistry office

## 2022-03-30 ENCOUNTER — Other Ambulatory Visit: Payer: Self-pay | Admitting: Family Medicine

## 2022-04-27 ENCOUNTER — Other Ambulatory Visit: Payer: Self-pay | Admitting: Family Medicine

## 2022-06-15 ENCOUNTER — Other Ambulatory Visit: Payer: Self-pay | Admitting: Family Medicine

## 2022-07-24 NOTE — Assessment & Plan Note (Signed)
hgba1c acceptable, minimize simple carbs. Increase exercise as tolerated.  

## 2022-07-24 NOTE — Assessment & Plan Note (Signed)
Well controlled, no changes to meds. Encouraged heart healthy diet such as the DASH diet and exercise as tolerated.

## 2022-07-24 NOTE — Assessment & Plan Note (Signed)
Encourage heart healthy diet such as MIND or DASH diet, increase exercise, avoid trans fats, simple carbohydrates and processed foods, consider a krill or fish or flaxseed oil cap daily.

## 2022-07-24 NOTE — Assessment & Plan Note (Signed)
Continue to monitor and supplement as needed

## 2022-07-25 ENCOUNTER — Ambulatory Visit (INDEPENDENT_AMBULATORY_CARE_PROVIDER_SITE_OTHER): Payer: Medicare Other | Admitting: Family Medicine

## 2022-07-25 VITALS — BP 138/78 | HR 63 | Temp 98.0°F | Resp 16 | Ht 76.0 in | Wt 231.2 lb

## 2022-07-25 DIAGNOSIS — D649 Anemia, unspecified: Secondary | ICD-10-CM

## 2022-07-25 DIAGNOSIS — I10 Essential (primary) hypertension: Secondary | ICD-10-CM | POA: Diagnosis not present

## 2022-07-25 DIAGNOSIS — Z23 Encounter for immunization: Secondary | ICD-10-CM

## 2022-07-25 DIAGNOSIS — R252 Cramp and spasm: Secondary | ICD-10-CM | POA: Diagnosis not present

## 2022-07-25 DIAGNOSIS — M109 Gout, unspecified: Secondary | ICD-10-CM | POA: Diagnosis not present

## 2022-07-25 DIAGNOSIS — E782 Mixed hyperlipidemia: Secondary | ICD-10-CM

## 2022-07-25 DIAGNOSIS — R739 Hyperglycemia, unspecified: Secondary | ICD-10-CM | POA: Diagnosis not present

## 2022-07-25 NOTE — Assessment & Plan Note (Signed)
Hydrate and monitor 

## 2022-07-25 NOTE — Progress Notes (Signed)
Subjective:   By signing my name below, I, Anthony Poole, attest that this documentation has been prepared under the direction and in the presence of Anthony Edge, MD 07/25/22   Patient ID: Anthony Poole, male    DOB: 1940-10-15, 82 y.o.   MRN: [id number]  Chief Complaint  Patient presents with   Follow-up    Follow up    HPI Patient is in today for a follow-up visit.  He reports feeling generally healthy apart from normal joint aches. He denies consistent leg cramps.  He is UTD on the pneumonia vaccine. He is due for his tetanus, flu and shingles vaccines. He is interested in receiving the flue vaccine today.  His daughter has been diagnosed with cancer recently.   Past Medical History:  Diagnosis Date   Acute pancreatitis 10/25/2016   Anemia 07/29/2012   Chronic knee pain 11/21/2012   Left s/p torn cartilage   Constipation 10/26/2016   Costochondritis 12/12/2016   Epistaxis 09/30/2016   GERD (gastroesophageal reflux disease)    Headache 04/20/2014   Hypertension    Hypocalcemia 03/16/2017   Medicare annual wellness visit, subsequent 04/14/2014   Obesity 10/20/2014   Other and unspecified hyperlipidemia 04/14/2013   Pancreatitis 10/22/2016   Preventative health care 07/29/2012   Shingles 12/12/2012   Sinusitis, acute 09/05/2012   Tachycardia 10/14/2014   Tick bite 11/19/2015    Past Surgical History:  Procedure Laterality Date   CHOLECYSTECTOMY      Family History  Problem Relation Age of Onset   COPD Mother        worked in [employer], clots   Peripheral vascular disease Mother    Dementia Father    Peripheral vascular disease Brother     Social History   Socioeconomic History   Marital status: Married    Spouse name: Not on file   Number of children: Not on file   Years of education: Not on file   Highest education level: Not on file  Occupational History   Not on file  Tobacco Use   Smoking status: Never   Smokeless tobacco: Never  Substance  and Sexual Activity   Alcohol use: No   Drug use: No   Sexual activity: Never    Comment: lives with wife, no dietary restrictions,  no fish  Other Topics Concern   Not on file  Social History Narrative   Not on file   Social Determinants of Health   Financial Resource Strain: Low Risk  (02/01/2021)   Overall Financial Resource Strain (CARDIA)    Difficulty of Paying Living Expenses: Not hard at all  Food Insecurity: No Food Insecurity (02/01/2021)   Hunger Vital Sign    Worried About Running Out of Food in the Last Year: Never true    Ran Out of Food in the Last Year: Never true  Transportation Needs: No Transportation Needs (02/01/2021)   PRAPARE - Administrator, Civil Service (Medical): No    Lack of Transportation (Non-Medical): No  Physical Activity: Inactive (02/01/2021)   Exercise Vital Sign    Days of Exercise per Week: 0 days    Minutes of Exercise per Session: 0 min  Stress: No Stress Concern Present (02/01/2021)   [name] of Occupational Health - Occupational Stress Questionnaire    Feeling of Stress : Not at all  Social Connections: Moderately Isolated (02/01/2021)   Social Connection and Isolation Panel [NHANES]    Frequency of Communication with Friends and Family:  More than three times a week    Frequency of Social Gatherings with Friends and Family: More than three times a week    Attends Religious Services: Never    Marine scientist or Organizations: No    Attends Archivist Meetings: Never    Marital Status: Married  Human resources officer Violence: Not At Risk (02/01/2021)   Humiliation, Afraid, Rape, and Kick questionnaire    Fear of Current or Ex-Partner: No    Emotionally Abused: No    Physically Abused: No    Sexually Abused: No    Outpatient Medications Prior to Visit  Medication Sig Dispense Refill   allopurinol (ZYLOPRIM) 100 MG tablet Take 1 tablet (100 mg total) by mouth daily. 90 tablet 1   amLODipine (NORVASC) 5 MG  tablet Take 1 tablet (5 mg total) by mouth daily. 90 tablet 1   aspirin 81 MG tablet Take 81 mg by mouth daily.      fluconazole (DIFLUCAN) 150 MG tablet Take 1 tablet (150 mg total) by mouth once a week. 4 tablet 1   MAGNESIUM PO Take by mouth daily.     Misc Natural Products (OSTEO BI-FLEX ADV JOINT SHIELD PO) Take by mouth daily.     Multiple Vitamins-Minerals (CENTRUM SILVER ADULT 50+ PO) Take by mouth daily.     Probiotic Product (PROBIOTIC DAILY) CAPS Hardin Negus Colon Health-Take 1 capsule by mouth daily.     valsartan (DIOVAN) 40 MG tablet Take 1 tablet (40 mg total) by mouth daily. 90 tablet 1   No facility-administered medications prior to visit.    Allergies  Allergen Reactions   Fish Allergy Itching   Propulsid [Cisapride] Hives    Review of Systems  Cardiovascular:  Negative for chest pain.  Gastrointestinal:  Negative for abdominal pain.       Objective:    Physical Exam Constitutional:      General: He is not in acute distress.    Appearance: Normal appearance. He is not ill-appearing.  HENT:     Head: Normocephalic and atraumatic.     Right Ear: External ear normal.     Left Ear: External ear normal.  Eyes:     Extraocular Movements: Extraocular movements intact.     Pupils: Pupils are equal, round, and reactive to light.  Cardiovascular:     Rate and Rhythm: Normal rate and regular rhythm.     Heart sounds: Normal heart sounds. No murmur heard.    No gallop.  Pulmonary:     Effort: Pulmonary effort is normal. No respiratory distress.     Breath sounds: Normal breath sounds. No wheezing or rales.  Neurological:     Mental Status: He is alert and oriented to person, place, and time.  Psychiatric:        Judgment: Judgment normal.     BP 138/78 (BP Location: Right Arm, Patient Position: Sitting, Cuff Size: Normal)   Pulse 63   Temp 98 F (36.7 C) (Oral)   Resp 16   Ht 6\' 4"  (1.93 m)   Wt 231 lb 3.2 oz (104.9 kg)   SpO2 97%   BMI 28.14 kg/m  Wt  Readings from Last 3 Encounters:  07/25/22 231 lb 3.2 oz (104.9 kg)  01/20/22 236 lb 3.2 oz (107.1 kg)  07/22/21 232 lb 9.6 oz (105.5 kg)       Assessment & Plan:  Hyperlipidemia, mixed Assessment & Plan: Encourage heart healthy diet such as MIND or DASH diet, increase exercise,  avoid trans fats, simple carbohydrates and processed foods, consider a krill or fish or flaxseed oil cap daily.   Orders: -     Lipid panel  Hyperglycemia Assessment & Plan: hgba1c acceptable, minimize simple carbs. Increase exercise as tolerated.   Orders: -     Hemoglobin A1c  Essential hypertension Assessment & Plan: Well controlled, no changes to meds. Encouraged heart healthy diet such as the DASH diet and exercise as tolerated.   Orders: -     CBC with Differential/Platelet -     Comprehensive metabolic panel -     TSH  Anemia, unspecified type Assessment & Plan: Continue to monitor and supplement as needed   Muscle cramp -     Magnesium  Need for influenza vaccination -     Flu Vaccine QUAD High Dose(Fluad)  Gout of foot, unspecified cause, unspecified chronicity, unspecified laterality Assessment & Plan: Hydrate and monitor       I,Alexander Ruley,acting as a scribe for Penni Homans, MD.,have documented all relevant documentation on the behalf of Penni Homans, MD,as directed by  Penni Homans, MD while in the presence of Penni Homans, MD.   I, Penni Homans, MD, personally preformed the services described in this documentation.  All medical record entries made by the scribe were at my direction and in my presence.  I have reviewed the chart and discharge instructions (if applicable) and agree that the record reflects my personal performance and is accurate and complete. 07/25/22   Penni Homans, MD

## 2022-07-25 NOTE — Patient Instructions (Addendum)
Tetanus at pharmacy  Shingrix is the new shingles shot, 2 shots over 2-6 months, confirm coverage with insurance and document, then can return here for shots with nurse appt or at pharmacy   RSV, Respiratory Syncitial Virus vaccine, Arexvy at pharmacy  Annual Covid shot  Hyland's leg cramp

## 2022-07-26 LAB — CBC WITH DIFFERENTIAL/PLATELET
Basophils Absolute: 0.1 10*3/uL (ref 0.0–0.1)
Basophils Relative: 1.1 % (ref 0.0–3.0)
Eosinophils Absolute: 0.1 10*3/uL (ref 0.0–0.7)
Eosinophils Relative: 1.5 % (ref 0.0–5.0)
HCT: 45.8 % (ref 39.0–52.0)
Hemoglobin: 15.2 g/dL (ref 13.0–17.0)
Lymphocytes Relative: 22.9 % (ref 12.0–46.0)
Lymphs Abs: 1.5 10*3/uL (ref 0.7–4.0)
MCHC: 33.3 g/dL (ref 30.0–36.0)
MCV: 98.6 fl (ref 78.0–100.0)
Monocytes Absolute: 0.6 10*3/uL (ref 0.1–1.0)
Monocytes Relative: 9.9 % (ref 3.0–12.0)
Neutro Abs: 4.2 10*3/uL (ref 1.4–7.7)
Neutrophils Relative %: 64.6 % (ref 43.0–77.0)
Platelets: 261 10*3/uL (ref 150.0–400.0)
RBC: 4.65 Mil/uL (ref 4.22–5.81)
RDW: 13.7 % (ref 11.5–15.5)
WBC: 6.4 10*3/uL (ref 4.0–10.5)

## 2022-07-26 LAB — LIPID PANEL
Cholesterol: 187 mg/dL (ref 0–200)
HDL: 59.4 mg/dL (ref >39.00)
LDL Cholesterol: 111 mg/dL — ABNORMAL HIGH (ref 0–99)
NonHDL: 128.01
Total CHOL/HDL Ratio: 3
Triglycerides: 85 mg/dL (ref 0.0–149.0)
VLDL: 17 mg/dL (ref 0.0–40.0)

## 2022-07-26 LAB — MAGNESIUM: Magnesium: 2.2 mg/dL (ref 1.5–2.5)

## 2022-07-26 LAB — COMPREHENSIVE METABOLIC PANEL
ALT: 18 U/L (ref 0–53)
AST: 22 U/L (ref 0–37)
Albumin: 4.2 g/dL (ref 3.5–5.2)
Alkaline Phosphatase: 75 U/L (ref 39–117)
BUN: 14 mg/dL (ref 6–23)
CO2: 27 mEq/L (ref 19–32)
Calcium: 9.5 mg/dL (ref 8.4–10.5)
Chloride: 104 mEq/L (ref 96–112)
Creatinine, Ser: 1.25 mg/dL (ref 0.40–1.50)
GFR: 54.35 mL/min — ABNORMAL LOW (ref >60.00)
Glucose, Bld: 93 mg/dL (ref 70–99)
Potassium: 4.6 mEq/L (ref 3.5–5.1)
Sodium: 139 mEq/L (ref 135–145)
Total Bilirubin: 0.4 mg/dL (ref 0.2–1.2)
Total Protein: 7.2 g/dL (ref 6.0–8.3)

## 2022-07-26 LAB — TSH: TSH: 1.73 u[IU]/mL (ref 0.35–5.50)

## 2022-07-26 LAB — HEMOGLOBIN A1C: Hgb A1c MFr Bld: 5.8 % (ref 4.6–6.5)

## 2022-08-02 ENCOUNTER — Encounter: Payer: Self-pay | Admitting: Family Medicine

## 2022-09-14 ENCOUNTER — Telehealth: Payer: Medicare Other | Admitting: Family

## 2022-09-14 DIAGNOSIS — L03119 Cellulitis of unspecified part of limb: Secondary | ICD-10-CM | POA: Diagnosis not present

## 2022-09-14 MED ORDER — CEPHALEXIN 500 MG PO CAPS: 500.0000 mg | ORAL_CAPSULE | Freq: Four times a day (QID) | ORAL | 0 refills | Status: AC

## 2022-09-14 NOTE — Progress Notes (Signed)
E Visit for Cellulitis  We are sorry that you are not feeling well. Here is how we plan to help!  Based on what you shared with me it looks like you have cellulitis.  Cellulitis looks like areas of skin redness, swelling, and warmth; it develops as a result of bacteria entering under the skin. Little red spots and/or bleeding can be seen in skin, and tiny surface sacs containing fluid can occur. Fever can be present. Cellulitis is almost always on one side of a body, and the lower limbs are the most common site of involvement.   I have prescribed:  Keflex 500mg take one by mouth four times a day for 5 days  HOME CARE:  . Take your medications as ordered and take all of them, even if the skin irritation appears to be healing.   GET HELP RIGHT AWAY IF:  . Symptoms that don't begin to go away within 48 hours. . Severe redness persists or worsens . If the area turns color, spreads or swells. . If it blisters and opens, develops yellow-brown crust or bleeds. . You develop a fever or chills. . If the pain increases or becomes unbearable.  . Are unable to keep fluids and food down.  MAKE SURE YOU    Understand these instructions.  Will watch your condition.  Will get help right away if you are not doing well or get worse.  Thank you for choosing an e-visit. Your e-visit answers were reviewed by a board certified advanced clinical practitioner to complete your personal care plan. Depending upon the condition, your plan could have included both over the counter or prescription medications. Please review your pharmacy choice. Make sure the pharmacy is open so you can pick up prescription now. If there is a problem, you may contact your provider through MyChart messaging and have the prescription routed to another pharmacy. Your safety is important to us. If you have drug allergies check your prescription carefully.  For the next 24 hours you can use MyChart to ask questions about today's  visit, request a non-urgent call back, or ask for a work or school excuse. You will get an email in the next two days asking about your experience. I hope that your e-visit has been valuable and will speed your recovery.  Approximately 5 minutes was spent documenting and reviewing patient's chart.   

## 2022-09-17 ENCOUNTER — Other Ambulatory Visit: Payer: Self-pay | Admitting: Family Medicine

## 2022-09-17 DIAGNOSIS — I10 Essential (primary) hypertension: Secondary | ICD-10-CM

## 2022-10-18 ENCOUNTER — Other Ambulatory Visit: Payer: Self-pay | Admitting: Family Medicine

## 2022-10-19 DIAGNOSIS — B353 Tinea pedis: Secondary | ICD-10-CM | POA: Diagnosis not present

## 2022-10-19 DIAGNOSIS — B351 Tinea unguium: Secondary | ICD-10-CM | POA: Diagnosis not present

## 2022-12-09 ENCOUNTER — Other Ambulatory Visit: Payer: Self-pay | Admitting: Family Medicine

## 2022-12-22 DIAGNOSIS — H2513 Age-related nuclear cataract, bilateral: Secondary | ICD-10-CM | POA: Diagnosis not present

## 2023-01-18 DIAGNOSIS — B353 Tinea pedis: Secondary | ICD-10-CM | POA: Diagnosis not present

## 2023-01-18 DIAGNOSIS — B351 Tinea unguium: Secondary | ICD-10-CM | POA: Diagnosis not present

## 2023-01-22 NOTE — Assessment & Plan Note (Signed)
Encouraged DASH diet, decrease po intake and increase exercise as tolerated. Needs 7-8 hours of sleep nightly. Avoid trans fats, eat small, frequent meals every 4-5 hours with lean proteins, complex carbs and healthy fats. Minimize simple carbs and processed foods.  

## 2023-01-22 NOTE — Assessment & Plan Note (Signed)
Well controlled, no changes to meds. Encouraged heart healthy diet such as the DASH diet and exercise as tolerated.  °

## 2023-01-22 NOTE — Assessment & Plan Note (Signed)
hgba1c acceptable, minimize simple carbs. Increase exercise as tolerated.  

## 2023-01-22 NOTE — Assessment & Plan Note (Signed)
Encourage heart healthy diet such as MIND or DASH diet, increase exercise, avoid trans fats, simple carbohydrates and processed foods, consider a krill or fish or flaxseed oil cap daily.  °

## 2023-01-22 NOTE — Assessment & Plan Note (Signed)
Hydrate and monitor 

## 2023-01-22 NOTE — Progress Notes (Unsigned)
Subjective:    Patient ID: Anthony Poole, male    DOB: 07/14/1940, 82 y.o.   MRN: [id number]  No chief complaint on file.   HPI Discussed the use of AI scribe software for clinical note transcription with the patient, who gave verbal consent to proceed.  History of Present Illness         Patient is an 82 yo male in today for follow up on chronic medical concerns. No recent febrile illness or hospitalizations. Denies CP/palp/SOB/HA/congestion/fevers/GI or GU c/o. Taking meds as prescribed    Past Medical History:  Diagnosis Date  . Acute pancreatitis 10/25/2016  . Anemia 07/29/2012  . 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 Constipation 10/26/2016  . Costochondritis 12/12/2016  . Epistaxis 09/30/2016  . GERD (gastroesophageal reflux disease)   . Headache 04/20/2014  . Hypertension   . Hypocalcemia 03/16/2017  . Medicare annual wellness visit, subsequent 04/14/2014  . Obesity 10/20/2014  . Other and unspecified hyperlipidemia 04/14/2013  . Pancreatitis 10/22/2016  . Preventative health care 07/29/2012  . Shingles 12/12/2012  . Sinusitis, acute 09/05/2012  . Tachycardia 10/14/2014  . Tick bite 11/19/2015    Past Surgical History:  Procedure Laterality Date  . CHOLECYSTECTOMY      Family History  Problem Relation Age of Onset  . COPD Mother        worked in [employer], clots  . Peripheral vascular disease Mother   . Dementia Father   . Peripheral vascular disease Brother     Social History   Socioeconomic History  . Marital status: Married    Spouse name: Not on file  . Number of children: Not on file  . Years of education: Not on file  . Highest education level: Not on file  Occupational History  . Not on file  Tobacco Use  . Smoking status: Never  . Smokeless tobacco: Never  Substance and Sexual Activity  . Alcohol use: No  . Drug use: No  . Sexual activity: Never    Comment: lives with wife, no dietary restrictions,  no fish  Other  Topics Concern  . Not on file  Social History Narrative  . Not on file   Social Determinants of Health   Financial Resource Strain: Low Risk  (02/01/2021)   Overall Financial Resource Strain (CARDIA)   . Difficulty of Paying Living Expenses: Not hard at all  Food Insecurity: No Food Insecurity (02/01/2021)   Hunger Vital Sign   . Worried About Programme researcher, broadcasting/film/video in the Last Year: Never true   . Ran Out of Food in the Last Year: Never true  Transportation Needs: No Transportation Needs (02/01/2021)   PRAPARE - Transportation   . Lack of Transportation (Medical): No   . Lack of Transportation (Non-Medical): No  Physical Activity: Inactive (02/01/2021)   Exercise Vital Sign   . Days of Exercise per Week: 0 days   . Minutes of Exercise per Session: 0 min  Stress: No Stress Concern Present (02/01/2021)   [name] of Occupational Health - Occupational Stress Questionnaire   . Feeling of Stress : Not at all  Social Connections: Moderately Isolated (02/01/2021)   Social Connection and Isolation Panel [NHANES]   . Frequency of Communication with Friends and Family: More than three times a week   . Frequency of Social Gatherings with Friends and Family: More than three times a week   . Attends Religious Services: Never   . Active  Member of Clubs or Organizations: No   . Attends Banker Meetings: Never   . Marital Status: Married  Catering manager Violence: Not At Risk (02/01/2021)   Humiliation, Afraid, Rape, and Kick questionnaire   . Fear of Current or Ex-Partner: No   . Emotionally Abused: No   . Physically Abused: No   . Sexually Abused: No    Outpatient Medications Prior to Visit  Medication Sig Dispense Refill  . allopurinol (ZYLOPRIM) 100 MG tablet TAKE 1 TABLET BY MOUTH EVERY DAY 90 tablet 1  . amLODipine (NORVASC) 5 MG tablet Take 1 tablet (5 mg total) by mouth daily. 90 tablet 1  . aspirin 81 MG tablet Take 81 mg by mouth daily.     . fluconazole (DIFLUCAN) 150  MG tablet Take 1 tablet (150 mg total) by mouth once a week. 4 tablet 1  . MAGNESIUM PO Take by mouth daily.    . Misc Natural Products (OSTEO BI-FLEX ADV JOINT SHIELD PO) Take by mouth daily.    . Multiple Vitamins-Minerals (CENTRUM SILVER ADULT 50+ PO) Take by mouth daily.    . Probiotic Product (PROBIOTIC DAILY) CAPS Vear Clock Colon Health-Take 1 capsule by mouth daily.    . valsartan (DIOVAN) 40 MG tablet Take 1 tablet (40 mg total) by mouth daily. 90 tablet 1   No facility-administered medications prior to visit.    Allergies  Allergen Reactions  . Fish Allergy Itching  . Propulsid [Cisapride] Hives    Review of Systems  Constitutional:  Negative for fever and malaise/fatigue.  HENT:  Negative for congestion.   Eyes:  Negative for blurred vision.  Respiratory:  Negative for shortness of breath.   Cardiovascular:  Negative for chest pain, palpitations and leg swelling.  Gastrointestinal:  Negative for abdominal pain, blood in stool and nausea.  Genitourinary:  Negative for dysuria and frequency.  Musculoskeletal:  Negative for falls.  Skin:  Negative for rash.  Neurological:  Negative for dizziness, loss of consciousness and headaches.  Endo/Heme/Allergies:  Negative for environmental allergies.  Psychiatric/Behavioral:  Negative for depression. The patient is not nervous/anxious.       Objective:    Physical Exam Vitals reviewed.  Constitutional:      Appearance: Normal appearance. He is not ill-appearing.  HENT:     Head: Normocephalic and atraumatic.     Nose: Nose normal.  Eyes:     Conjunctiva/sclera: Conjunctivae normal.  Cardiovascular:     Rate and Rhythm: Normal rate.     Pulses: Normal pulses.     Heart sounds: Normal heart sounds. No murmur heard. Pulmonary:     Effort: Pulmonary effort is normal.     Breath sounds: Normal breath sounds. No wheezing.  Abdominal:     Palpations: Abdomen is soft. There is no mass.     Tenderness: There is no abdominal  tenderness.  Musculoskeletal:     Cervical back: Normal range of motion.     Right lower leg: No edema.     Left lower leg: No edema.  Skin:    General: Skin is warm and dry.  Neurological:     General: No focal deficit present.     Mental Status: He is alert and oriented to person, place, and time.  Psychiatric:        Mood and Affect: Mood normal.   There were no vitals taken for this visit. Wt Readings from Last 3 Encounters:  07/25/22 231 lb 3.2 oz (104.9 kg)  01/20/22  236 lb 3.2 oz (107.1 kg)  07/22/21 232 lb 9.6 oz (105.5 kg)    Diabetic Foot Exam - Simple   No data filed    Lab Results  Component Value Date   WBC 6.4 07/25/2022   HGB 15.2 07/25/2022   HCT 45.8 07/25/2022   PLT 261.0 07/25/2022   GLUCOSE 93 07/25/2022   CHOL 187 07/25/2022   TRIG 85.0 07/25/2022   HDL 59.40 07/25/2022   LDLCALC 111 (H) 07/25/2022   ALT 18 07/25/2022   AST 22 07/25/2022   NA 139 07/25/2022   K 4.6 07/25/2022   CL 104 07/25/2022   CREATININE 1.25 07/25/2022   BUN 14 07/25/2022   CO2 27 07/25/2022   TSH 1.73 07/25/2022   PSA 0.56 07/22/2021   INR 1.13 10/27/2016   HGBA1C 5.8 07/25/2022    Lab Results  Component Value Date   TSH 1.73 07/25/2022   Lab Results  Component Value Date   WBC 6.4 07/25/2022   HGB 15.2 07/25/2022   HCT 45.8 07/25/2022   MCV 98.6 07/25/2022   PLT 261.0 07/25/2022   Lab Results  Component Value Date   NA 139 07/25/2022   K 4.6 07/25/2022   CO2 27 07/25/2022   GLUCOSE 93 07/25/2022   BUN 14 07/25/2022   CREATININE 1.25 07/25/2022   BILITOT 0.4 07/25/2022   ALKPHOS 75 07/25/2022   AST 22 07/25/2022   ALT 18 07/25/2022   PROT 7.2 07/25/2022   ALBUMIN 4.2 07/25/2022   CALCIUM 9.5 07/25/2022   ANIONGAP 11 10/27/2016   GFR 54.35 (L) 07/25/2022   Lab Results  Component Value Date   CHOL 187 07/25/2022   Lab Results  Component Value Date   HDL 59.40 07/25/2022   Lab Results  Component Value Date   LDLCALC 111 (H) 07/25/2022    Lab Results  Component Value Date   TRIG 85.0 07/25/2022   Lab Results  Component Value Date   CHOLHDL 3 07/25/2022   Lab Results  Component Value Date   HGBA1C 5.8 07/25/2022       Assessment & Plan:  Essential hypertension Assessment & Plan: Well controlled, no changes to meds. Encouraged heart healthy diet such as the DASH diet and exercise as tolerated.    Hyperlipidemia, mixed Assessment & Plan: Encourage heart healthy diet such as MIND or DASH diet, increase exercise, avoid trans fats, simple carbohydrates and processed foods, consider a krill or fish or flaxseed oil cap daily.    Hyperglycemia Assessment & Plan: hgba1c acceptable, minimize simple carbs. Increase exercise as tolerated.    Obesity due to excess calories with serious comorbidity, unspecified classification Assessment & Plan: Encouraged DASH diet, decrease po intake and increase exercise as tolerated. Needs 7-8 hours of sleep nightly. Avoid trans fats, eat small, frequent meals every 4-5 hours with lean proteins, complex carbs and healthy fats. Minimize simple carbs and processed foods   Gout of foot, unspecified cause, unspecified chronicity, unspecified laterality Assessment & Plan: Hydrate and monitor      Assessment and Plan              [name], MD

## 2023-01-23 ENCOUNTER — Ambulatory Visit (INDEPENDENT_AMBULATORY_CARE_PROVIDER_SITE_OTHER): Payer: Medicare Other | Admitting: Family Medicine

## 2023-01-23 ENCOUNTER — Encounter: Payer: Self-pay | Admitting: Family Medicine

## 2023-01-23 VITALS — BP 142/73 | HR 57 | Temp 97.8°F | Resp 18 | Ht 76.0 in | Wt 230.2 lb

## 2023-01-23 DIAGNOSIS — I10 Essential (primary) hypertension: Secondary | ICD-10-CM

## 2023-01-23 DIAGNOSIS — E782 Mixed hyperlipidemia: Secondary | ICD-10-CM

## 2023-01-23 DIAGNOSIS — R739 Hyperglycemia, unspecified: Secondary | ICD-10-CM | POA: Diagnosis not present

## 2023-01-23 DIAGNOSIS — E6609 Other obesity due to excess calories: Secondary | ICD-10-CM | POA: Diagnosis not present

## 2023-01-23 DIAGNOSIS — M109 Gout, unspecified: Secondary | ICD-10-CM | POA: Diagnosis not present

## 2023-01-23 NOTE — Patient Instructions (Addendum)
Netflix [employer]  RSV, Respiratory Syncitial Virus vaccine, Arexvy at pharmacy  Shingrix is the new shingles shot, 2 shots over 2-6 months, confirm coverage with insurance and document, then can return here for shots with nurse appt or at pharmacy   Tetanus booster at pharmacy for sure get one if you are ever injured

## 2023-01-24 LAB — COMPREHENSIVE METABOLIC PANEL
ALT: 16 U/L (ref 0–53)
AST: 20 U/L (ref 0–37)
Albumin: 4.1 g/dL (ref 3.5–5.2)
Alkaline Phosphatase: 82 U/L (ref 39–117)
BUN: 15 mg/dL (ref 6–23)
CO2: 28 mEq/L (ref 19–32)
Calcium: 9.6 mg/dL (ref 8.4–10.5)
Chloride: 103 mEq/L (ref 96–112)
Creatinine, Ser: 1.32 mg/dL (ref 0.40–1.50)
GFR: 50.73 mL/min — ABNORMAL LOW (ref >60.00)
Glucose, Bld: 87 mg/dL (ref 70–99)
Potassium: 4.5 mEq/L (ref 3.5–5.1)
Sodium: 138 mEq/L (ref 135–145)
Total Bilirubin: 0.6 mg/dL (ref 0.2–1.2)
Total Protein: 7 g/dL (ref 6.0–8.3)

## 2023-01-24 LAB — CBC WITH DIFFERENTIAL/PLATELET
Basophils Absolute: 0.1 10*3/uL (ref 0.0–0.1)
Basophils Relative: 1 % (ref 0.0–3.0)
Eosinophils Absolute: 0.2 10*3/uL (ref 0.0–0.7)
Eosinophils Relative: 2.2 % (ref 0.0–5.0)
HCT: 45.1 % (ref 39.0–52.0)
Hemoglobin: 14.9 g/dL (ref 13.0–17.0)
Lymphocytes Relative: 22 % (ref 12.0–46.0)
Lymphs Abs: 1.6 10*3/uL (ref 0.7–4.0)
MCHC: 33 g/dL (ref 30.0–36.0)
MCV: 98.8 fl (ref 78.0–100.0)
Monocytes Absolute: 0.7 10*3/uL (ref 0.1–1.0)
Monocytes Relative: 9.3 % (ref 3.0–12.0)
Neutro Abs: 4.6 10*3/uL (ref 1.4–7.7)
Neutrophils Relative %: 65.5 % (ref 43.0–77.0)
Platelets: 221 10*3/uL (ref 150.0–400.0)
RBC: 4.57 Mil/uL (ref 4.22–5.81)
RDW: 13.4 % (ref 11.5–15.5)
WBC: 7.1 10*3/uL (ref 4.0–10.5)

## 2023-01-24 LAB — HEMOGLOBIN A1C: Hgb A1c MFr Bld: 5.8 % (ref 4.6–6.5)

## 2023-01-24 LAB — LIPID PANEL
Cholesterol: 202 mg/dL — ABNORMAL HIGH (ref 0–200)
HDL: 51.6 mg/dL (ref >39.00)
LDL Cholesterol: 133 mg/dL — ABNORMAL HIGH (ref 0–99)
NonHDL: 150.35
Total CHOL/HDL Ratio: 4
Triglycerides: 89 mg/dL (ref 0.0–149.0)
VLDL: 17.8 mg/dL (ref 0.0–40.0)

## 2023-01-24 LAB — TSH: TSH: 1.45 u[IU]/mL (ref 0.35–5.50)

## 2023-01-24 LAB — URIC ACID: Uric Acid, Serum: 5.3 mg/dL (ref 4.0–7.8)

## 2023-02-19 DIAGNOSIS — R609 Edema, unspecified: Secondary | ICD-10-CM | POA: Diagnosis not present

## 2023-02-19 DIAGNOSIS — R600 Localized edema: Secondary | ICD-10-CM | POA: Diagnosis not present

## 2023-02-20 ENCOUNTER — Ambulatory Visit (INDEPENDENT_AMBULATORY_CARE_PROVIDER_SITE_OTHER): Payer: Medicare Other | Admitting: *Deleted

## 2023-02-20 DIAGNOSIS — Z0000 Encounter for general adult medical examination without abnormal findings: Secondary | ICD-10-CM

## 2023-02-20 NOTE — Patient Instructions (Signed)
Anthony Poole , Thank you for taking time to come for your Medicare Wellness Visit. I appreciate your ongoing commitment to your health goals. Please review the following plan we discussed and let me know if I can assist you in the future.     This is a list of the screening recommended for you and due dates:  Health Maintenance  Topic Date Due   Zoster (Shingles) Vaccine (1 of 2) 12/21/1991   DTaP/Tdap/Td vaccine (2 - Td or Tdap) 10/05/2021   Flu Shot  02/02/2023   Medicare Annual Wellness Visit  02/20/2024   Pneumonia Vaccine  Completed   HPV Vaccine  Aged Out   COVID-19 Vaccine  Discontinued    Next appointment: Follow up in one year for your annual wellness visit.   Preventive Care 74 Years and Older, Male Preventive care refers to lifestyle choices and visits with your health care provider that can promote health and wellness. What does preventive care include? A yearly physical exam. This is also called an annual well check. Dental exams once or twice a year. Routine eye exams. Ask your health care provider how often you should have your eyes checked. Personal lifestyle choices, including: Daily care of your teeth and gums. Regular physical activity. Eating a healthy diet. Avoiding tobacco and drug use. Limiting alcohol use. Practicing safe sex. Taking low doses of aspirin every day. Taking vitamin and mineral supplements as recommended by your health care provider. What happens during an annual well check? The services and screenings done by your health care provider during your annual well check will depend on your age, overall health, lifestyle risk factors, and family history of disease. Counseling  Your health care provider may ask you questions about your: Alcohol use. Tobacco use. Drug use. Emotional well-being. Home and relationship well-being. Sexual activity. Eating habits. History of falls. Memory and ability to understand (cognition). Work and work  Astronomer. Screening  You may have the following tests or measurements: Height, weight, and BMI. Blood pressure. Lipid and cholesterol levels. These may be checked every 5 years, or more frequently if you are over 51 years old. Skin check. Lung cancer screening. You may have this screening every year starting at age 57 if you have a 30-pack-year history of smoking and currently smoke or have quit within the past 15 years. Fecal occult blood test (FOBT) of the stool. You may have this test every year starting at age 43. Flexible sigmoidoscopy or colonoscopy. You may have a sigmoidoscopy every 5 years or a colonoscopy every 10 years starting at age 81. Prostate cancer screening. Recommendations will vary depending on your family history and other risks. Hepatitis C blood test. Hepatitis B blood test. Sexually transmitted disease (STD) testing. Diabetes screening. This is done by checking your blood sugar (glucose) after you have not eaten for a while (fasting). You may have this done every 1-3 years. Abdominal aortic aneurysm (AAA) screening. You may need this if you are a current or former smoker. Osteoporosis. You may be screened starting at age 52 if you are at high risk. Talk with your health care provider about your test results, treatment options, and if necessary, the need for more tests. Vaccines  Your health care provider may recommend certain vaccines, such as: Influenza vaccine. This is recommended every year. Tetanus, diphtheria, and acellular pertussis (Tdap, Td) vaccine. You may need a Td booster every 10 years. Zoster vaccine. You may need this after age 51. Pneumococcal 13-valent conjugate (PCV13) vaccine. One  dose is recommended after age 31. Pneumococcal polysaccharide (PPSV23) vaccine. One dose is recommended after age 20. Talk to your health care provider about which screenings and vaccines you need and how often you need them. This information is not intended to replace  advice given to you by your health care provider. Make sure you discuss any questions you have with your health care provider. Document Released: 07/17/2015 Document Revised: 03/09/2016 Document Reviewed: 04/21/2015 Elsevier Interactive Patient Education  2017 [employer].  Fall Prevention in the Home Falls can cause injuries. They can happen to people of all ages. There are many things you can do to make your home safe and to help prevent falls. What can I do on the outside of my home? Regularly fix the edges of walkways and driveways and fix any cracks. Remove anything that might make you trip as you walk through a door, such as a raised step or threshold. Trim any bushes or trees on the path to your home. Use bright outdoor lighting. Clear any walking paths of anything that might make someone trip, such as rocks or tools. Regularly check to see if handrails are loose or broken. Make sure that both sides of any steps have handrails. Any raised decks and porches should have guardrails on the edges. Have any leaves, snow, or ice cleared regularly. Use sand or salt on walking paths during winter. Clean up any spills in your garage right away. This includes oil or grease spills. What can I do in the bathroom? Use night lights. Install grab bars by the toilet and in the tub and shower. Do not use towel bars as grab bars. Use non-skid mats or decals in the tub or shower. If you need to sit down in the shower, use a plastic, non-slip stool. Keep the floor dry. Clean up any water that spills on the floor as soon as it happens. Remove soap buildup in the tub or shower regularly. Attach bath mats securely with double-sided non-slip rug tape. Do not have throw rugs and other things on the floor that can make you trip. What can I do in the bedroom? Use night lights. Make sure that you have a light by your bed that is easy to reach. Do not use any sheets or blankets that are too big for your bed.  They should not hang down onto the floor. Have a firm chair that has side arms. You can use this for support while you get dressed. Do not have throw rugs and other things on the floor that can make you trip. What can I do in the kitchen? Clean up any spills right away. Avoid walking on wet floors. Keep items that you use a lot in easy-to-reach places. If you need to reach something above you, use a strong step stool that has a grab bar. Keep electrical cords out of the way. Do not use floor polish or wax that makes floors slippery. If you must use wax, use non-skid floor wax. Do not have throw rugs and other things on the floor that can make you trip. What can I do with my stairs? Do not leave any items on the stairs. Make sure that there are handrails on both sides of the stairs and use them. Fix handrails that are broken or loose. Make sure that handrails are as long as the stairways. Check any carpeting to make sure that it is firmly attached to the stairs. Fix any carpet that is loose or worn.  Avoid having throw rugs at the top or bottom of the stairs. If you do have throw rugs, attach them to the floor with carpet tape. Make sure that you have a light switch at the top of the stairs and the bottom of the stairs. If you do not have them, ask someone to add them for you. What else can I do to help prevent falls? Wear shoes that: Do not have high heels. Have rubber bottoms. Are comfortable and fit you well. Are closed at the toe. Do not wear sandals. If you use a stepladder: Make sure that it is fully opened. Do not climb a closed stepladder. Make sure that both sides of the stepladder are locked into place. Ask someone to hold it for you, if possible. Clearly mark and make sure that you can see: Any grab bars or handrails. First and last steps. Where the edge of each step is. Use tools that help you move around (mobility aids) if they are needed. These  include: Canes. Walkers. Scooters. Crutches. Turn on the lights when you go into a dark area. Replace any light bulbs as soon as they burn out. Set up your furniture so you have a clear path. Avoid moving your furniture around. If any of your floors are uneven, fix them. If there are any pets around you, be aware of where they are. Review your medicines with your doctor. Some medicines can make you feel dizzy. This can increase your chance of falling. Ask your doctor what other things that you can do to help prevent falls. This information is not intended to replace advice given to you by your health care provider. Make sure you discuss any questions you have with your health care provider. Document Released: 04/16/2009 Document Revised: 11/26/2015 Document Reviewed: 07/25/2014 Elsevier Interactive Patient Education  2017 [employer].

## 2023-02-20 NOTE — Progress Notes (Signed)
Subjective:   Anthony Poole is a 82 y.o. male who presents for Medicare Annual/Subsequent preventive examination.  Visit Complete: Virtual  I connected with  Amadeo Garnet on 02/20/23 by a audio enabled telemedicine application and verified that I am speaking with the correct person using two identifiers.  Patient Location: Home  Provider Location: Office/Clinic  I discussed the limitations of evaluation and management by telemedicine. The patient expressed understanding and agreed to proceed.  Patient Medicare AWV questionnaire was completed by the patient on 02/08/23; I have confirmed that all information answered by patient is correct and no changes since this date.  Review of Systems     Cardiac Risk Factors include: advanced age (>91men, >103 women);male gender;dyslipidemia;hypertension     Objective:  Vital Signs: Unable to obtain new vitals due to this being a telehealth visit.      02/20/2023    3:41 PM 02/04/2022    3:15 PM 02/01/2021    1:06 PM 10/01/2019    3:04 PM 10/27/2016    1:34 PM 10/22/2016   11:55 AM 10/04/2016   10:55 AM  Advanced Directives  Does Patient Have a Medical Advance Directive? Yes Yes No Yes No No No  Type of Estate agent of [place];Living will Healthcare Power of [place];Living will  Healthcare Power of [place];Living will     Does patient want to make changes to medical advance directive? No - Patient declined No - Patient declined No - Patient declined No - Patient declined     Copy of Healthcare Power of Attorney in Chart? No - copy requested No - copy requested  No - copy requested     Would patient like information on creating a medical advance directive?       No - Patient declined    Current Medications (verified) Outpatient Encounter Medications as of 02/20/2023  Medication Sig   allopurinol (ZYLOPRIM) 100 MG tablet TAKE 1 TABLET BY MOUTH EVERY DAY   amLODipine (NORVASC) 5 MG tablet Take 1 tablet (5 mg total) by  mouth daily.   aspirin 81 MG tablet Take 81 mg by mouth daily.    MAGNESIUM PO Take by mouth daily.   Misc Natural Products (OSTEO BI-FLEX ADV JOINT SHIELD PO) Take by mouth daily.   Multiple Vitamins-Minerals (CENTRUM SILVER ADULT 50+ PO) Take by mouth daily.   Probiotic Product (PROBIOTIC DAILY) CAPS Vear Clock Colon Health-Take 1 capsule by mouth daily.   valsartan (DIOVAN) 40 MG tablet Take 1 tablet (40 mg total) by mouth daily.   [DISCONTINUED] fluconazole (DIFLUCAN) 150 MG tablet Take 1 tablet (150 mg total) by mouth once a week. (Patient not taking: Reported on 01/23/2023)   No facility-administered encounter medications on file as of 02/20/2023.    Allergies (verified) Fish allergy and Propulsid [cisapride]   History: Past Medical History:  Diagnosis Date   Acute pancreatitis 10/25/2016   Anemia 07/29/2012   Chronic knee pain 11/21/2012   Left s/p torn cartilage   Constipation 10/26/2016   Costochondritis 12/12/2016   Epistaxis 09/30/2016   GERD (gastroesophageal reflux disease)    Headache 04/20/2014   Hypertension    Hypocalcemia 03/16/2017   Medicare annual wellness visit, subsequent 04/14/2014   Obesity 10/20/2014   Other and unspecified hyperlipidemia 04/14/2013   Pancreatitis 10/22/2016   Preventative health care 07/29/2012   Shingles 12/12/2012   Sinusitis, acute 09/05/2012   Tachycardia 10/14/2014   Tick bite 11/19/2015   Past Surgical History:  Procedure Laterality Date  CHOLECYSTECTOMY     Family History  Problem Relation Age of Onset   COPD Mother        worked in [employer], clots   Peripheral vascular disease Mother    Dementia Father    Peripheral vascular disease Brother    Social History   Socioeconomic History   Marital status: Married    Spouse name: Not on file   Number of children: Not on file   Years of education: Not on file   Highest education level: Not on file  Occupational History   Not on file  Tobacco Use   Smoking status: Never    Smokeless tobacco: Never  Substance and Sexual Activity   Alcohol use: No   Drug use: No   Sexual activity: Not Currently    Birth control/protection: Condom    Comment: lives with wife, no dietary restrictions,  no fish  Other Topics Concern   Not on file  Social History Narrative   Not on file   Social Determinants of Health   Financial Resource Strain: Patient Declined (02/08/2023)   Overall Financial Resource Strain (CARDIA)    Difficulty of Paying Living Expenses: Patient declined  Food Insecurity: No Food Insecurity (02/08/2023)   Hunger Vital Sign    Worried About Running Out of Food in the Last Year: Never true    Ran Out of Food in the Last Year: Never true  Transportation Needs: No Transportation Needs (02/08/2023)   PRAPARE - Administrator, Civil Service (Medical): No    Lack of Transportation (Non-Medical): No  Physical Activity: Patient Declined (02/08/2023)   Exercise Vital Sign    Days of Exercise per Week: Patient declined    Minutes of Exercise per Session: Patient declined  Stress: Patient Declined (02/08/2023)   [name] of Occupational Health - Occupational Stress Questionnaire    Feeling of Stress : Patient declined  Social Connections: Unknown (02/08/2023)   Social Connection and Isolation Panel [NHANES]    Frequency of Communication with Friends and Family: More than three times a week    Frequency of Social Gatherings with Friends and Family: More than three times a week    Attends Religious Services: Not on Marketing executive or Organizations: Patient declined    Attends Banker Meetings: Patient declined    Marital Status: Married    Tobacco Counseling Counseling given: Not Answered   Clinical Intake:  Pre-visit preparation completed: Yes  Pain : No/denies pain   Nutritional Risks: None Diabetes: No  How often do you need to have someone help you when you read instructions, pamphlets, or other written  materials from your doctor or pharmacy?: 1 - Never  Interpreter Needed?: No  Information entered by :: [employer], CMA   Activities of Daily Living    02/08/2023    3:51 PM  In your present state of health, do you have any difficulty performing the following activities:  Hearing? 0  Vision? 0  Difficulty concentrating or making decisions? 0  Walking or climbing stairs? 0  Dressing or bathing? 0  Doing errands, shopping? 0  Preparing Food and eating ? N  Using the Toilet? N  In the past six months, have you accidently leaked urine? N  Do you have problems with loss of bowel control? N  Managing your Medications? N  Managing your Finances? N  Housekeeping or managing your Housekeeping? N    Patient Care Team: Abner Greenspan,  Bryon Lions, MD as PCP - General (Family Medicine)  Indicate any recent Medical Services you may have received from other than [facility] providers in the past year (date may be approximate).     Assessment:   This is a routine wellness examination for Jody.  Hearing/Vision screen No results found.  Dietary issues and exercise activities discussed:     Goals Addressed   None    Depression Screen    02/20/2023    3:44 PM 07/25/2022    1:52 PM 02/04/2022    3:16 PM 01/20/2022    2:34 PM 07/22/2021    3:15 PM 02/01/2021    1:10 PM 01/07/2021    9:53 AM  PHQ 2/9 Scores  PHQ - 2 Score 0 0 0 0 0 0 0  PHQ- 9 Score     0      Fall Risk    02/08/2023    3:51 PM 07/25/2022    1:51 PM 02/04/2022    3:16 PM 01/20/2022    2:34 PM 02/01/2021    1:09 PM  Fall Risk   Falls in the past year? 0 0 0 0 1  Number falls in past yr: 0 0 0 0 0  Injury with Fall? 0 0 0 0 0  Risk for fall due to : No Fall Risks  No Fall Risks No Fall Risks   Follow up Falls evaluation completed Falls evaluation completed Falls evaluation completed Falls evaluation completed Falls prevention discussed    MEDICARE RISK AT HOME: Medicare Risk at Home Any stairs in or around the home?: Yes If so, are  there any without handrails?: No Home free of loose throw rugs in walkways, pet beds, electrical cords, etc?: Yes Adequate lighting in your home to reduce risk of falls?: Yes Life alert?: No Use of a cane, walker or w/c?: No Grab bars in the bathroom?: Yes Shower chair or bench in shower?: No Elevated toilet seat or a handicapped toilet?: Yes  TIMED UP AND GO:  Was the test performed?  No    Cognitive Function:        02/20/2023    3:46 PM 02/04/2022    3:21 PM  6CIT Screen  What Year? 0 points 0 points  What month? 0 points 0 points  What time? 0 points 0 points  Count back from 20 2 points 0 points  Months in reverse 0 points 0 points  Repeat phrase 0 points 0 points  Total Score 2 points 0 points    Immunizations Immunization History  Administered Date(s) Administered   Fluad Quad(high Dose 65+) 05/23/2019, 07/09/2020, 07/22/2021, 07/25/2022   Influenza Split 04/02/2012   Influenza, High Dose Seasonal PF 04/12/2013, 05/23/2016, 03/16/2017, 03/01/2018   Influenza,inj,Quad PF,6+ Mos 04/14/2014, 05/20/2015   Moderna Sars-Covid-2 Vaccination 09/04/2019, 10/02/2019   Pneumococcal Conjugate-13 04/14/2014   Pneumococcal Polysaccharide-23 09/26/2016   Tdap 10/06/2011   Zoster, Live 10/06/2011    TDAP status: Due, Education has been provided regarding the importance of this vaccine. Advised may receive this vaccine at local pharmacy or Health Dept. Aware to provide a copy of the vaccination record if obtained from local pharmacy or Health Dept. Verbalized acceptance and understanding.  Flu Vaccine status: Due, Education has been provided regarding the importance of this vaccine. Advised may receive this vaccine at local pharmacy or Health Dept. Aware to provide a copy of the vaccination record if obtained from local pharmacy or Health Dept. Verbalized acceptance and understanding.  Pneumococcal vaccine status: Up  to date  Covid-19 vaccine status: Declined, Education has been  provided regarding the importance of this vaccine but patient still declined. Advised may receive this vaccine at local pharmacy or Health Dept.or vaccine clinic. Aware to provide a copy of the vaccination record if obtained from local pharmacy or Health Dept. Verbalized acceptance and understanding.  Qualifies for Shingles Vaccine? Yes   Zostavax completed Yes   Shingrix Completed?: No.    Education has been provided regarding the importance of this vaccine. Patient has been advised to call insurance company to determine out of pocket expense if they have not yet received this vaccine. Advised may also receive vaccine at local pharmacy or Health Dept. Verbalized acceptance and understanding.  Screening Tests Health Maintenance  Topic Date Due   Zoster Vaccines- Shingrix (1 of 2) 12/21/1991   DTaP/Tdap/Td (2 - Td or Tdap) 10/05/2021   Medicare Annual Wellness (AWV)  02/05/2023   INFLUENZA VACCINE  02/02/2023   Pneumonia Vaccine 80+ Years old  Completed   HPV VACCINES  Aged Out   COVID-19 Vaccine  Discontinued    Health Maintenance  Health Maintenance Due  Topic Date Due   Zoster Vaccines- Shingrix (1 of 2) 12/21/1991   DTaP/Tdap/Td (2 - Td or Tdap) 10/05/2021   Medicare Annual Wellness (AWV)  02/05/2023   INFLUENZA VACCINE  02/02/2023    Colorectal cancer screening: No longer required.   Lung Cancer Screening: (Low Dose CT Chest recommended if Age 69-80 years, 20 pack-year currently smoking OR have quit w/in 15years.) does not qualify.   Additional Screening:  Hepatitis C Screening: does not qualify  Vision Screening: Recommended annual ophthalmology exams for early detection of glaucoma and other disorders of the eye. Is the patient up to date with their annual eye exam?  Yes  Who is the provider or what is the name of the office in which the patient attends annual eye exams? Can't remember name at this time If pt is not established with a provider, would they like to be  referred to a provider to establish care? No .   Dental Screening: Recommended annual dental exams for proper oral hygiene  Diabetic Foot Exam: N/a  Community Resource Referral / Chronic Care Management: CRR required this visit?  No   CCM required this visit?  No     Plan:     I have personally reviewed and noted the following in the patient's chart:   Medical and social history Use of alcohol, tobacco or illicit drugs  Current medications and supplements including opioid prescriptions. Patient is not currently taking opioid prescriptions. Functional ability and status Nutritional status Physical activity Advanced directives List of other physicians Hospitalizations, surgeries, and ER visits in previous 12 months Vitals Screenings to include cognitive, depression, and falls Referrals and appointments  In addition, I have reviewed and discussed with patient certain preventive protocols, quality metrics, and best practice recommendations. A written personalized care plan for preventive services as well as general preventive health recommendations were provided to patient.     Donne Anon, CMA   02/20/2023   After Visit Summary: (MyChart) Due to this being a telephonic visit, the after visit summary with patients personalized plan was offered to patient via MyChart   Nurse Notes: None

## 2023-03-16 ENCOUNTER — Other Ambulatory Visit: Payer: Self-pay | Admitting: Family Medicine

## 2023-03-16 DIAGNOSIS — I10 Essential (primary) hypertension: Secondary | ICD-10-CM

## 2023-03-29 ENCOUNTER — Telehealth: Payer: Self-pay

## 2023-03-29 ENCOUNTER — Other Ambulatory Visit: Payer: Self-pay

## 2023-03-29 DIAGNOSIS — Z1211 Encounter for screening for malignant neoplasm of colon: Secondary | ICD-10-CM

## 2023-03-29 NOTE — Telephone Encounter (Signed)
Called pt was advised due to complete Cologuard Pt agree and it has been ordered.

## 2023-04-12 LAB — COLOGUARD

## 2023-04-14 ENCOUNTER — Other Ambulatory Visit: Payer: Self-pay | Admitting: Family Medicine

## 2023-04-18 ENCOUNTER — Telehealth: Payer: Self-pay

## 2023-04-18 ENCOUNTER — Other Ambulatory Visit: Payer: Self-pay

## 2023-04-18 DIAGNOSIS — Z1211 Encounter for screening for malignant neoplasm of colon: Secondary | ICD-10-CM

## 2023-04-18 NOTE — Telephone Encounter (Signed)
Called pt was advised, we ordered a new  Cologuard. Stated understand.

## 2023-05-10 ENCOUNTER — Encounter: Payer: Self-pay | Admitting: Family Medicine

## 2023-05-18 ENCOUNTER — Ambulatory Visit (INDEPENDENT_AMBULATORY_CARE_PROVIDER_SITE_OTHER): Payer: Medicare Other

## 2023-05-18 DIAGNOSIS — Z23 Encounter for immunization: Secondary | ICD-10-CM

## 2023-06-02 ENCOUNTER — Other Ambulatory Visit: Payer: Self-pay | Admitting: Family Medicine

## 2023-06-13 DIAGNOSIS — L02416 Cutaneous abscess of left lower limb: Secondary | ICD-10-CM | POA: Diagnosis not present

## 2023-07-23 NOTE — Assessment & Plan Note (Deleted)
Hydrate and monitor 

## 2023-07-23 NOTE — Assessment & Plan Note (Deleted)
hgba1c acceptable, minimize simple carbs. Increase exercise as tolerated.  

## 2023-07-23 NOTE — Assessment & Plan Note (Deleted)
Encourage heart healthy diet such as MIND or DASH diet, increase exercise, avoid trans fats, simple carbohydrates and processed foods, consider a krill or fish or flaxseed oil cap daily.  °

## 2023-07-23 NOTE — Assessment & Plan Note (Deleted)
Encouraged DASH diet, decrease po intake and increase exercise as tolerated. Needs 7-8 hours of sleep nightly. Avoid trans fats, eat small, frequent meals every 4-5 hours with lean proteins, complex carbs and healthy fats. Minimize simple carbs and processed foods.  

## 2023-07-23 NOTE — Assessment & Plan Note (Deleted)
Well controlled, no changes to meds. Encouraged heart healthy diet such as the DASH diet and exercise as tolerated.  °

## 2023-07-27 ENCOUNTER — Ambulatory Visit: Payer: Medicare Other | Admitting: Family Medicine

## 2023-07-27 DIAGNOSIS — E782 Mixed hyperlipidemia: Secondary | ICD-10-CM

## 2023-07-27 DIAGNOSIS — M109 Gout, unspecified: Secondary | ICD-10-CM

## 2023-07-27 DIAGNOSIS — I10 Essential (primary) hypertension: Secondary | ICD-10-CM

## 2023-07-27 DIAGNOSIS — R739 Hyperglycemia, unspecified: Secondary | ICD-10-CM

## 2023-07-27 DIAGNOSIS — E6609 Other obesity due to excess calories: Secondary | ICD-10-CM

## 2023-08-24 ENCOUNTER — Ambulatory Visit: Payer: Medicare Other | Admitting: Family Medicine

## 2023-09-15 ENCOUNTER — Other Ambulatory Visit: Payer: Self-pay | Admitting: Family Medicine

## 2023-09-15 DIAGNOSIS — I10 Essential (primary) hypertension: Secondary | ICD-10-CM

## 2023-09-28 ENCOUNTER — Ambulatory Visit: Admitting: Family Medicine

## 2023-09-28 ENCOUNTER — Other Ambulatory Visit: Payer: Self-pay | Admitting: Emergency Medicine

## 2023-09-28 VITALS — BP 142/80 | HR 54 | Temp 97.6°F | Resp 16 | Ht 76.0 in | Wt 226.2 lb

## 2023-09-28 DIAGNOSIS — E782 Mixed hyperlipidemia: Secondary | ICD-10-CM

## 2023-09-28 DIAGNOSIS — R739 Hyperglycemia, unspecified: Secondary | ICD-10-CM

## 2023-09-28 DIAGNOSIS — I10 Essential (primary) hypertension: Secondary | ICD-10-CM

## 2023-09-28 DIAGNOSIS — I4891 Unspecified atrial fibrillation: Secondary | ICD-10-CM

## 2023-09-28 MED ORDER — APIXABAN 5 MG PO TABS: 5.0000 mg | ORAL_TABLET | Freq: Two times a day (BID) | ORAL | 3 refills | Status: DC

## 2023-09-28 NOTE — Patient Instructions (Signed)
 Atrial Fibrillation Atrial fibrillation (AFib) is a type of irregular or rapid heartbeat (arrhythmia). In AFib, the top part of the heart (atria) beats in an irregular pattern. This makes the heart unable to pump blood normally and effectively. The goal of treatment is to prevent blood clots from forming, control your heart rate, or restore your heartbeat to a normal rhythm. If this condition is not treated, it can cause serious problems, such as a weakened heart muscle (cardiomyopathy) or a stroke. What are the causes? This condition is often caused by medical conditions that damage the heart's electrical system. These include: High blood pressure (hypertension). This is the most common cause. Certain heart problems or conditions, such as heart failure, coronary artery disease, heart valve problems, or heart surgery. Diabetes. Overactive thyroid (hyperthyroidism). Chronic kidney disease. Certain lung conditions, such as emphysema, pneumonia, or COPD. Obstructive sleep apnea. In some cases, the cause of this condition is not known. What increases the risk? This condition is more likely to develop in: Older adults. Athletes who do endurance exercise. People who have a family history of AFib. Males. People who are Caucasian. People who are obese. People who smoke or misuse alcohol. What are the signs or symptoms? Symptoms of this condition include: Fast or irregular heartbeats (palpitations). Discomfort or pain in your chest. Shortness of breath. Sudden light-headedness or weakness. Tiring easily during exercise or activity. Syncope (fainting). Sweating. In some cases, there are no symptoms. How is this diagnosed? Your health care provider may detect AFib when taking your pulse. If detected, this condition may be diagnosed with: An electrocardiogram (ECG) to check electrical signals of the heart. An ambulatory cardiac monitor to record your heart's activity for a few days. A  transthoracic echocardiogram (TTE) to create pictures of your heart. A transesophageal echocardiogram (TEE) to create even clearer pictures of your heart. A stress test to check your blood supply while you exercise. Imaging tests, such as a CT scan or chest X-ray. Blood tests. How is this treated? Treatment depends on underlying conditions and how you feel when you get AFib. This condition may be treated with: Medicines to prevent blood clots or to treat heart rate or heart rhythm problems. Electrical cardioversion to reset the heart's rhythm. A pacemaker to correct abnormal heart rhythm. Ablation to remove the heart tissue that sends abnormal signals. Left atrial appendage closure to seal the area where blood clots can form. In some cases, underlying conditions will be treated. Follow these instructions at home: Medicines Take over-the counter and prescription medicines only as told by your provider. Do not take any new medicines without talking to your provider. If you are taking blood thinners: Talk with your provider before taking aspirin or NSAIDs. These medicines can raise your risk of bleeding. Take your medicines as told. Take them at the same time each day. Do not do things that could hurt or bruise you. Be careful to avoid falls. Wear an alert bracelet or carry a card that says that you take blood thinners. Lifestyle Do not use any products that contain nicotine or tobacco. These products include cigarettes, chewing tobacco, and vaping devices, such as e-cigarettes. If you need help quitting, ask your provider. Eat heart-healthy foods. Talk with a food expert (dietitian) to make an eating plan that is right for you. Exercise regularly as told by your provider. Do not drink alcohol. Lose weight if you are overweight. General instructions If you have obstructive sleep apnea, manage your condition as told by your provider.  Do not use diet pills unless your provider approves. Diet  pills can make heart problems worse. Keep all follow-up visits. Your provider will want to check your heart rate and rhythm regularly. Contact a health care provider if: You notice a change in the rate, rhythm, or strength of your heartbeat. You are taking a blood thinner and you notice more bruising. You tire more easily when you exercise or do heavy work. You have a sudden change in weight. Get help right away if:  You have chest pain. You have trouble breathing. You have side effects of blood thinners, such as blood in your vomit, poop (stool), or pee (urine), or bleeding that does not stop. You have any symptoms of a stroke. "BE FAST" is an easy way to remember the main warning signs of a stroke: B - Balance. Signs are dizziness, sudden trouble walking, or loss of balance. E - Eyes. Signs are trouble seeing or a sudden change in vision. F - Face. Signs are sudden weakness or numbness of the face, or the face or eyelid drooping on one side. A - Arms. Signs are weakness or numbness in an arm. This happens suddenly and usually on one side of the body. S - Speech.Signs are sudden trouble speaking, slurred speech, or trouble understanding what people say. T - Time. Time to call emergency services. Write down what time symptoms started. Other signs of a stroke, such as: A sudden, severe headache with no known cause. Nausea or vomiting. Seizure. These symptoms may be an emergency. Get help right away. Call 911. Do not wait to see if the symptoms will go away. Do not drive yourself to the hospital. This information is not intended to replace advice given to you by your health care provider. Make sure you discuss any questions you have with your health care provider. Document Revised: 03/09/2022 Document Reviewed: 03/09/2022 Elsevier Patient Education  2024 [employer].

## 2023-09-30 ENCOUNTER — Other Ambulatory Visit: Payer: Self-pay | Admitting: Family Medicine

## 2023-10-01 ENCOUNTER — Encounter: Payer: Self-pay | Admitting: Family Medicine

## 2023-10-01 DIAGNOSIS — I4819 Other persistent atrial fibrillation: Secondary | ICD-10-CM | POA: Insufficient documentation

## 2023-10-01 DIAGNOSIS — I4891 Unspecified atrial fibrillation: Secondary | ICD-10-CM | POA: Insufficient documentation

## 2023-10-01 HISTORY — DX: Unspecified atrial fibrillation: I48.91

## 2023-10-01 NOTE — Assessment & Plan Note (Signed)
 hgba1c acceptable, minimize simple carbs. Increase exercise as tolerated.

## 2023-10-01 NOTE — Progress Notes (Signed)
 Subjective:    Patient ID: Anthony Poole, male    DOB: 01-19-1941, 83 y.o.   MRN: [id number]  No chief complaint on file.   HPI Discussed the use of AI scribe software for clinical note transcription with the patient, who gave verbal consent to proceed.  History of Present Illness Anthony Poole is an 83 year old male who presents with an irregular heart rhythm. He is accompanied by a family member.  The irregular heart rhythm was first noticed last week during a family activity using a CardiaMobile device, which indicated an irregular rhythm that was not too fast. He is asymptomatic with no chest pain, palpitations, or syncope, and no significant shortness of breath beyond his usual level.  He reports a dry, non-productive cough but otherwise feels well. He maintains good nutrition and hydration and denies any significant changes in his health status since the irregular rhythm was detected.     Past Medical History:  Diagnosis Date   Acute pancreatitis 10/25/2016   Anemia 07/29/2012   Chronic knee pain 11/21/2012   Left s/p torn cartilage   Constipation 10/26/2016   Costochondritis 12/12/2016   Epistaxis 09/30/2016   GERD (gastroesophageal reflux disease)    Headache 04/20/2014   Hypertension    Hypocalcemia 03/16/2017   Medicare annual wellness visit, subsequent 04/14/2014   Obesity 10/20/2014   Other and unspecified hyperlipidemia 04/14/2013   Pancreatitis 10/22/2016   Preventative health care 07/29/2012   Shingles 12/12/2012   Sinusitis, acute 09/05/2012   Tachycardia 10/14/2014   Tick bite 11/19/2015    Past Surgical History:  Procedure Laterality Date   CHOLECYSTECTOMY      Family History  Problem Relation Age of Onset   COPD Mother        worked in [employer], clots   Peripheral vascular disease Mother    Dementia Father    Peripheral vascular disease Brother     Social History   Socioeconomic History   Marital status: Married    Spouse name: Not on file    Number of children: Not on file   Years of education: Not on file   Highest education level: Not on file  Occupational History   Not on file  Tobacco Use   Smoking status: Never   Smokeless tobacco: Never  Substance and Sexual Activity   Alcohol use: No   Drug use: No   Sexual activity: Not Currently    Birth control/protection: Condom    Comment: lives with wife, no dietary restrictions,  no fish  Other Topics Concern   Not on file  Social History Narrative   Not on file   Social Drivers of Health   Financial Resource Strain: Patient Declined (02/08/2023)   Overall Financial Resource Strain (CARDIA)    Difficulty of Paying Living Expenses: Patient declined  Food Insecurity: No Food Insecurity (02/08/2023)   Hunger Vital Sign    Worried About Running Out of Food in the Last Year: Never true    Ran Out of Food in the Last Year: Never true  Transportation Needs: No Transportation Needs (02/08/2023)   PRAPARE - Administrator, Civil Service (Medical): No    Lack of Transportation (Non-Medical): No  Physical Activity: Patient Declined (02/08/2023)   Exercise Vital Sign    Days of Exercise per Week: Patient declined    Minutes of Exercise per Session: Patient declined  Stress: Patient Declined (02/08/2023)   [name] of Occupational Health - Occupational Stress  Questionnaire    Feeling of Stress : Patient declined  Social Connections: Unknown (02/08/2023)   Social Connection and Isolation Panel [NHANES]    Frequency of Communication with Friends and Family: More than three times a week    Frequency of Social Gatherings with Friends and Family: More than three times a week    Attends Religious Services: Not on file    Active Member of Clubs or Organizations: Patient declined    Attends Banker Meetings: Patient declined    Marital Status: Married  Catering manager Violence: Not At Risk (02/20/2023)   Humiliation, Afraid, Rape, and Kick questionnaire     Fear of Current or Ex-Partner: No    Emotionally Abused: No    Physically Abused: No    Sexually Abused: No    Outpatient Medications Prior to Visit  Medication Sig Dispense Refill   allopurinol (ZYLOPRIM) 100 MG tablet TAKE 1 TABLET BY MOUTH EVERY DAY 90 tablet 1   amLODipine (NORVASC) 5 MG tablet TAKE 1 TABLET (5 MG TOTAL) BY MOUTH DAILY. 90 tablet 1   aspirin 81 MG tablet Take 81 mg by mouth daily.      MAGNESIUM PO Take by mouth daily.     Misc Natural Products (OSTEO BI-FLEX ADV JOINT SHIELD PO) Take by mouth daily.     Multiple Vitamins-Minerals (CENTRUM SILVER ADULT 50+ PO) Take by mouth daily.     Probiotic Product (PROBIOTIC DAILY) CAPS Vear Clock Colon Health-Take 1 capsule by mouth daily.     valsartan (DIOVAN) 40 MG tablet TAKE 1 TABLET BY MOUTH EVERY DAY 90 tablet 1   No facility-administered medications prior to visit.    Allergies  Allergen Reactions   Fish Allergy Itching   Propulsid [Cisapride] Hives    Review of Systems  Constitutional:  Negative for fever and malaise/fatigue.  HENT:  Negative for congestion.   Eyes:  Negative for blurred vision.  Respiratory:  Negative for shortness of breath.   Cardiovascular:  Negative for chest pain, palpitations and leg swelling.  Gastrointestinal:  Negative for abdominal pain, blood in stool and nausea.  Genitourinary:  Negative for dysuria and frequency.  Musculoskeletal:  Negative for falls.  Skin:  Negative for rash.  Neurological:  Negative for dizziness, loss of consciousness and headaches.  Endo/Heme/Allergies:  Negative for environmental allergies.  Psychiatric/Behavioral:  Negative for depression. The patient is not nervous/anxious.        Objective:    Physical Exam Vitals reviewed.  Constitutional:      Appearance: Normal appearance. He is not ill-appearing.  HENT:     Head: Normocephalic and atraumatic.     Nose: Nose normal.  Eyes:     Conjunctiva/sclera: Conjunctivae normal.  Cardiovascular:      Rate and Rhythm: Bradycardia present. Rhythm irregular.     Pulses: Normal pulses.     Heart sounds: Normal heart sounds. No murmur heard. Pulmonary:     Effort: Pulmonary effort is normal.     Breath sounds: Normal breath sounds. No wheezing.  Abdominal:     Palpations: Abdomen is soft. There is no mass.     Tenderness: There is no abdominal tenderness.  Musculoskeletal:     Cervical back: Normal range of motion.     Right lower leg: No edema.     Left lower leg: No edema.  Skin:    General: Skin is warm and dry.  Neurological:     General: No focal deficit present.     Mental  Status: He is alert and oriented to person, place, and time.  Psychiatric:        Mood and Affect: Mood normal.     BP (!) 142/80 (BP Location: Left Arm, Patient Position: Sitting, Cuff Size: Normal)   Pulse (!) 54   Temp 97.6 F (36.4 C) (Oral)   Resp 16   Ht 6\' 4"  (1.93 m)   Wt 226 lb 3.2 oz (102.6 kg)   SpO2 99%   BMI 27.53 kg/m  Wt Readings from Last 3 Encounters:  09/28/23 226 lb 3.2 oz (102.6 kg)  01/23/23 230 lb 3.2 oz (104.4 kg)  07/25/22 231 lb 3.2 oz (104.9 kg)    Diabetic Foot Exam - Simple   No data filed    Lab Results  Component Value Date   WBC 7.1 01/23/2023   HGB 14.9 01/23/2023   HCT 45.1 01/23/2023   PLT 221.0 01/23/2023   GLUCOSE 87 01/23/2023   CHOL 202 (H) 01/23/2023   TRIG 89.0 01/23/2023   HDL 51.60 01/23/2023   LDLCALC 133 (H) 01/23/2023   ALT 16 01/23/2023   AST 20 01/23/2023   NA 138 01/23/2023   K 4.5 01/23/2023   CL 103 01/23/2023   CREATININE 1.32 01/23/2023   BUN 15 01/23/2023   CO2 28 01/23/2023   TSH 1.45 01/23/2023   PSA 0.56 07/22/2021   INR 1.13 10/27/2016   HGBA1C 5.8 01/23/2023    Lab Results  Component Value Date   TSH 1.45 01/23/2023   Lab Results  Component Value Date   WBC 7.1 01/23/2023   HGB 14.9 01/23/2023   HCT 45.1 01/23/2023   MCV 98.8 01/23/2023   PLT 221.0 01/23/2023   Lab Results  Component Value Date   NA 138  01/23/2023   K 4.5 01/23/2023   CO2 28 01/23/2023   GLUCOSE 87 01/23/2023   BUN 15 01/23/2023   CREATININE 1.32 01/23/2023   BILITOT 0.6 01/23/2023   ALKPHOS 82 01/23/2023   AST 20 01/23/2023   ALT 16 01/23/2023   PROT 7.0 01/23/2023   ALBUMIN 4.1 01/23/2023   CALCIUM 9.6 01/23/2023   ANIONGAP 11 10/27/2016   GFR 50.73 (L) 01/23/2023   Lab Results  Component Value Date   CHOL 202 (H) 01/23/2023   Lab Results  Component Value Date   HDL 51.60 01/23/2023   Lab Results  Component Value Date   LDLCALC 133 (H) 01/23/2023   Lab Results  Component Value Date   TRIG 89.0 01/23/2023   Lab Results  Component Value Date   CHOLHDL 4 01/23/2023   Lab Results  Component Value Date   HGBA1C 5.8 01/23/2023       Assessment & Plan:  Atrial fibrillation, unspecified type (HCC) -     EKG 12-Lead -     Ambulatory referral to Cardiology  Essential hypertension Assessment & Plan: Mildly eleved, no changes to meds, monitor resting blood pressure at home and report if above 140/90 consistently. Encouraged heart healthy diet such as the DASH diet and exercise as tolerated.    Hyperlipidemia, mixed Assessment & Plan: Encourage heart healthy diet such as MIND or DASH diet, increase exercise, avoid trans fats, simple carbohydrates and processed foods, consider a krill or fish or flaxseed oil cap daily.    Hyperglycemia Assessment & Plan: hgba1c acceptable, minimize simple carbs. Increase exercise as tolerated.    New onset a-fib [facility]) Assessment & Plan: Patient in office today for his wife's appointment when they mention that  while they were using the [facility] to check her heart they checked him and found him to be in afib last week. He is and was asymptomatic and rate controlled. They do note he had a viral illness with cough several weeks ago and was taking some OTC cough and cold preparations. He is advised to avoid all preparations with D and DM. EKG today confirms afib  and he is started on Eliquis 5 mg bid and referred to cardiology. He will seek care if symptoms develop.   Other orders -     Apixaban; Take 1 tablet (5 mg total) by mouth 2 (two) times daily.  Dispense: 60 tablet; Refill: 3    Assessment and Plan Assessment & Plan Atrial Fibrillation Irregular heart rhythm confirmed by EKG. Asymptomatic except for dry cough. Stroke prevention prioritized. Eliquis chosen for anticoagulation. Informed of potential side effects including increased bruising and bleeding. - Start Eliquis 5 mg twice daily. - Provide two weeks of Eliquis samples and send prescription to pharmacy. - Refer to cardiology for further evaluation. - Instruct to report any changes in symptoms.   Follow-up Follow-up scheduled for June or July if asymptomatic. Advised to report any symptom changes. - Ensure follow-up appointment in June or July.     [name], MD

## 2023-10-01 NOTE — Assessment & Plan Note (Signed)
 Encourage heart healthy diet such as MIND or DASH diet, increase exercise, avoid trans fats, simple carbohydrates and processed foods, consider a krill or fish or flaxseed oil cap daily.

## 2023-10-01 NOTE — Assessment & Plan Note (Signed)
 Mildly eleved, no changes to meds, monitor resting blood pressure at home and report if above 140/90 consistently. Encouraged heart healthy diet such as the DASH diet and exercise as tolerated.

## 2023-10-01 NOTE — Assessment & Plan Note (Signed)
 Patient in office today for his wife's appointment when they mention that while they were using the [facility] to check her heart they checked him and found him to be in afib last week. He is and was asymptomatic and rate controlled. They do note he had a viral illness with cough several weeks ago and was taking some OTC cough and cold preparations. He is advised to avoid all preparations with D and DM. EKG today confirms afib and he is started on Eliquis 5 mg bid and referred to cardiology. He will seek care if symptoms develop.

## 2023-10-06 ENCOUNTER — Ambulatory Visit

## 2023-10-06 VITALS — BP 126/74 | HR 57 | Ht 75.0 in | Wt 224.2 lb

## 2023-10-06 DIAGNOSIS — R600 Localized edema: Secondary | ICD-10-CM | POA: Diagnosis not present

## 2023-10-06 DIAGNOSIS — I10 Essential (primary) hypertension: Secondary | ICD-10-CM | POA: Diagnosis not present

## 2023-10-06 DIAGNOSIS — R0600 Dyspnea, unspecified: Secondary | ICD-10-CM

## 2023-10-06 DIAGNOSIS — I4891 Unspecified atrial fibrillation: Secondary | ICD-10-CM

## 2023-10-06 HISTORY — DX: Localized edema: R60.0

## 2023-10-06 NOTE — Assessment & Plan Note (Signed)
 Well-controlled. Continue current medications amlodipine 5 mg once daily and valsartan 40 mg once daily. Target blood pressure below 140/90 mmHg given his elderly age.

## 2023-10-06 NOTE — Assessment & Plan Note (Addendum)
 Appears persistent. Initially device identified using Kardia mobile at home.  Asymptomatic. Confirmed at PCPs office visit 10-01-2023. Remains in A-fib today here at our office. CHA2DS2-VASc score 3. Relatively low risk for bleeding. Good functional status.  Agree with anticoagulation using Eliquis 5 mg twice daily. He is tolerating this well.  He is well aware of atrial fibrillation as his wife carries the same diagnosis.  Discussed with about potential triggers for A-fib. Rate versus rhythm control options for A-fib discussed at length. Given this is his initial diagnosis of A-fib I would recommend rhythm goal despite the fact he is asymptomatic.  In this context discussed cardioversion with or without TEE based on the duration of anticoagulation and the physician's preference. He is agreeable to proceed with cardioversion. He prefers to get this done at [facility].  - Will schedule him for cardioversion tentatively at [facility] at least after 3 weeks [which will need at least 4 weeks uninterrupted on Eliquis for anticoagulation]. - Will obtain transthoracic echocardiogram for baseline cardiac structure and function assessment. - If cardioversion is unsuccessful or he reverts to A-fib unable to maintain sinus rhythm post cardioversion, will refer to electrophysiologist for further rhythm control discussion versus ablation.   - Discussed potential further triggers of A-fib and recommended evaluation with PCP and undergo evaluation for sleep apnea if indicated.  He does mention mild snoring when he is tired. - Check CBC as he is now on Eliquis for the past week to assess baseline function.   - Okay to discontinue aspirin now that he is on Eliquis.

## 2023-10-06 NOTE — Assessment & Plan Note (Signed)
 Dependent bilateral lower extremity edema. Likely related to venous insufficiency. Will obtain basic blood work with CBC, CMP, magnesium, proBNP. Will obtain transthoracic echocardiogram as discussed above will help assess for any significant diastolic dysfunction.

## 2023-10-06 NOTE — Progress Notes (Signed)
 Cardiology Consultation:    Date:  10/06/2023   ID:  Anthony Poole, DOB Mar 31, 1941, MRN [id number]  PCP:  Bradd Canary, MD  Cardiologist:  Marlyn Corporal Dorreen Valiente, MD   Referring MD: Bradd Canary, MD   No chief complaint on file.    ASSESSMENT AND PLAN:   Mr. Hutcherson 83 year old male with persistent atrial fibrillation, hypertension, hyperlipidemia, CKD stage IIIa, prediabetes and no other significant prior cardiac history, here to establish care in the setting of new diagnosis of atrial fibrillation at home by [facility] mobile and later confirmed at PCPs office.  Problem List Items Addressed This Visit     Essential hypertension - Primary   Well-controlled. Continue current medications amlodipine 5 mg once daily and valsartan 40 mg once daily. Target blood pressure below 140/90 mmHg given his elderly age.       Relevant Orders   EKG 12-Lead (Completed)   New onset a-fib (HCC)   Appears persistent. Initially device identified using Kardia mobile at home.  Asymptomatic. Confirmed at PCPs office visit 10-01-2023. Remains in A-fib today here at our office. CHA2DS2-VASc score 3. Relatively low risk for bleeding. Good functional status.  Agree with anticoagulation using Eliquis 5 mg twice daily. He is tolerating this well.  He is well aware of atrial fibrillation as his wife carries the same diagnosis.  Discussed with about potential triggers for A-fib. Rate versus rhythm control options for A-fib discussed at length. Given this is his initial diagnosis of A-fib I would recommend rhythm goal despite the fact he is asymptomatic.  In this context discussed cardioversion with or without TEE based on the duration of anticoagulation and the physician's preference. He is agreeable to proceed with cardioversion. He prefers to get this done at [facility].  - Will schedule him for cardioversion tentatively at [facility] at least after 3 weeks [which will  need at least 4 weeks uninterrupted on Eliquis for anticoagulation]. - Will obtain transthoracic echocardiogram for baseline cardiac structure and function assessment. - If cardioversion is unsuccessful or he reverts to A-fib unable to maintain sinus rhythm post cardioversion, will refer to electrophysiologist for further rhythm control discussion versus ablation.   - Discussed potential further triggers of A-fib and recommended evaluation with PCP and undergo evaluation for sleep apnea if indicated.  He does mention mild snoring when he is tired. - Check CBC as he is now on Eliquis for the past week to assess baseline function.   - Okay to discontinue aspirin now that he is on Eliquis.      Relevant Orders   EKG 12-Lead (Completed)   ECHOCARDIOGRAM COMPLETE   Bilateral lower extremity edema   Dependent bilateral lower extremity edema. Likely related to venous insufficiency. Will obtain basic blood work with CBC, CMP, magnesium, proBNP. Will obtain transthoracic echocardiogram as discussed above will help assess for any significant diastolic dysfunction.      Relevant Orders   ECHOCARDIOGRAM COMPLETE   CBC   Comp Met (CMET)   Magnesium   Pro b natriuretic peptide (BNP)   Other Visit Diagnoses       Dyspnea, unspecified type       Relevant Orders   ECHOCARDIOGRAM COMPLETE   CBC   Comp Met (CMET)   Magnesium   Pro b natriuretic peptide (BNP)      Return to clinic tentatively in 2 months.   History of Present Illness:    ADVAITH LAMARQUE is a 83 y.o. male who  is being seen today for the evaluation of atrial fibrillation newly diagnosed at recent PCP office visit at the request of Bradd Canary, MD. Pleasant man here for the visit accompanied by his wife Anthony Poole.  History of persistent atrial fibrillation, hypertension, hyperlipidemia, CKD stage IIIa, prediabetes. Denies any prior history of CAD, CHF, MI, CVA.  Initially noted irregular heart rates using his Kardia mobile  device at home few weeks ago, subsequently at PCPs visit confirmed with twelve-lead EKG and was started on anticoagulation with Eliquis.  At baseline good functional status with his household activities and working in the garden. Denies any chest pain, shortness of breath, orthopnea, paroxysmal nocturnal dyspnea, palpitations, lightheadedness, dizziness, syncopal or near syncopal episodes.  Has mild shortness of breath at baseline walking up an incline on his yard and this has been relatively unchanged for over 6 to 12 months. Has mild bilateral lower extremity edema at times after standing for prolonged.'s and this goes down as he has his feet up or when he has compression socks.  Compliant with his medications.  Has started taking Eliquis and tolerating it well no blood in urine or stools.  Does not smoke. Does not drink alcohol. No coffee consumption.  Drinks ice tea. No energy drinks.   EKG in the clinic today shows atrial fibrillation rate controlled heart rate 57/min, QRS duration 96 ms, no significant ST-T changes to suggest ischemia.  In comparison prior EKG from April 2018 showed sinus rhythm with heart rate 57/min.  Prior blood work from January 23, 2023 noted normal TSH 1.45 CBC was normal hemoglobin 14.9, hematocrit 45.1, WBC 7.1, platelets 221. Hemoglobin A1c 5.8, prediabetes. Lipid panel with total cholesterol 202, HDL 51, LDL 133, triglycerides 89 BUN 15, creatinine 1.32, EGFR 50.7, suggest CKD stage III Normal transaminases and alkaline phosphatase  Past Medical History:  Diagnosis Date   Acute pancreatitis 10/25/2016   Allergy 10/20/2013   Anemia 07/29/2012   Chronic knee pain 11/21/2012   Left s/p torn cartilage   Constipation 10/26/2016   Costochondritis 12/12/2016   Epistaxis 09/30/2016   Essential hypertension 04/02/2012   Foot swelling 08/10/2015   GERD (gastroesophageal reflux disease)    Gout of foot 06/05/2016   Headache 04/20/2014   Hyperglycemia  06/05/2016   Hyperlipidemia, mixed 04/14/2013   Hypertension    Hypocalcemia 03/16/2017   Low back pain radiating down leg 04/29/2012   Medicare annual wellness visit, subsequent 04/14/2014   New onset a-fib (HCC) 10/01/2023   Obesity 10/20/2014   Other and unspecified hyperlipidemia 04/14/2013   Pancreatitis 10/22/2016   Preventative health care 07/29/2012   Rash 11/19/2015   Right knee pain 11/21/2012   Left s/p torn cartilage     Shingles 12/12/2012   Sinusitis, acute 09/05/2012   Tachycardia 10/14/2014   TIA (transient ischemic attack) 09/05/2012   No acute infarct.   Periventricular white matter type changes probably related to   result of small vessel disease.   Left vertebral artery poorly delineated and may be diminutive in   size possibly congenitally small although atherosclerotic type   changes not excluded.   Minimal to mild paranasal sinus mucosal thickening most notable   inferior aspect of the left maxillary sinus.         Tick bite 11/19/2015   Tinea pedis 07/23/2021    Past Surgical History:  Procedure Laterality Date   CHOLECYSTECTOMY      Current Medications: Current Meds  Medication Sig   allopurinol (ZYLOPRIM) 100 MG tablet TAKE  1 TABLET BY MOUTH EVERY DAY   amLODipine (NORVASC) 5 MG tablet TAKE 1 TABLET (5 MG TOTAL) BY MOUTH DAILY.   apixaban (ELIQUIS) 5 MG TABS tablet Take 1 tablet (5 mg total) by mouth 2 (two) times daily.   aspirin 81 MG tablet Take 81 mg by mouth daily.    MAGNESIUM PO Take by mouth daily.   Misc Natural Products (OSTEO BI-FLEX ADV JOINT SHIELD PO) Take by mouth daily.   Multiple Vitamins-Minerals (CENTRUM SILVER ADULT 50+ PO) Take by mouth daily.   Probiotic Product (PROBIOTIC DAILY) CAPS Vear Clock Colon Health-Take 1 capsule by mouth daily.   valsartan (DIOVAN) 40 MG tablet TAKE 1 TABLET BY MOUTH EVERY DAY     Allergies:   Fish allergy and Propulsid [cisapride]   Social History   Socioeconomic History   Marital status: Married     Spouse name: Not on file   Number of children: Not on file   Years of education: Not on file   Highest education level: Not on file  Occupational History   Not on file  Tobacco Use   Smoking status: Never   Smokeless tobacco: Never  Substance and Sexual Activity   Alcohol use: No   Drug use: No   Sexual activity: Not Currently    Birth control/protection: Condom    Comment: lives with wife, no dietary restrictions,  no fish  Other Topics Concern   Not on file  Social History Narrative   Not on file   Social Drivers of Health   Financial Resource Strain: Patient Declined (02/08/2023)   Overall Financial Resource Strain (CARDIA)    Difficulty of Paying Living Expenses: Patient declined  Food Insecurity: No Food Insecurity (02/08/2023)   Hunger Vital Sign    Worried About Running Out of Food in the Last Year: Never true    Ran Out of Food in the Last Year: Never true  Transportation Needs: No Transportation Needs (02/08/2023)   PRAPARE - Administrator, Civil Service (Medical): No    Lack of Transportation (Non-Medical): No  Physical Activity: Patient Declined (02/08/2023)   Exercise Vital Sign    Days of Exercise per Week: Patient declined    Minutes of Exercise per Session: Patient declined  Stress: Patient Declined (02/08/2023)   [name] of Occupational Health - Occupational Stress Questionnaire    Feeling of Stress : Patient declined  Social Connections: Unknown (02/08/2023)   Social Connection and Isolation Panel [NHANES]    Frequency of Communication with Friends and Family: More than three times a week    Frequency of Social Gatherings with Friends and Family: More than three times a week    Attends Religious Services: Not on Marketing executive or Organizations: Patient declined    Attends Banker Meetings: Patient declined    Marital Status: Married     Family History: The patient's family history includes COPD in his  mother; Dementia in his father; Peripheral vascular disease in his brother and mother. ROS:   Please see the history of present illness.    All 14 point review of systems negative except as described per history of present illness.  EKGs/Labs/Other Studies Reviewed:    The following studies were reviewed today:   EKG:  EKG Interpretation Date/Time:  Friday October 06 2023 13:47:19 EDT Ventricular Rate:  57 PR Interval:    QRS Duration:  96 QT Interval:  424 QTC Calculation: 412 R Axis:  20  Text Interpretation: Atrial fibrillation with slow ventricular response When compared with ECG of 22-Oct-2016 12:32, PREVIOUS ECG IS PRESENT Confirmed by Huntley Dec reddy [phone]) on 10/06/2023 1:58:38 PM    Recent Labs: 01/23/2023: ALT 16; BUN 15; Creatinine, Ser 1.32; Hemoglobin 14.9; Platelets 221.0; Potassium 4.5; Sodium 138; TSH 1.45  Recent Lipid Panel    Component Value Date/Time   CHOL 202 (H) 01/23/2023 1556   TRIG 89.0 01/23/2023 1556   HDL 51.60 01/23/2023 1556   CHOLHDL 4 01/23/2023 1556   VLDL 17.8 01/23/2023 1556   LDLCALC 133 (H) 01/23/2023 1556   LDLCALC 106 (H) 03/05/2020 1401    Physical Exam:    VS:  BP 126/74   Pulse (!) 57   Ht 6\' 3"  (1.905 m)   Wt 224 lb 3.2 oz (101.7 kg)   SpO2 97%   BMI 28.02 kg/m     Wt Readings from Last 3 Encounters:  10/06/23 224 lb 3.2 oz (101.7 kg)  09/28/23 226 lb 3.2 oz (102.6 kg)  01/23/23 230 lb 3.2 oz (104.4 kg)     GENERAL:  Well nourished, well developed in no acute distress NECK: No JVD; No carotid bruits CARDIAC: Irregularly irregular, relatively slow, S1 and S2 present, no murmurs, no rubs, no gallops CHEST:  Clear to auscultation without rales, wheezing or rhonchi  Extremities: Trace bilateral pitting ankle edema. Pulses bilaterally symmetric with radial 2+ and dorsalis pedis 2+ NEUROLOGIC:  Alert and oriented x 3  Medication Adjustments/Labs and Tests Ordered: Current medicines are reviewed at length with the  patient today.  Concerns regarding medicines are outlined above.  Orders Placed This Encounter  Procedures   CBC   Comp Met (CMET)   Magnesium   Pro b natriuretic peptide (BNP)   Confirm CBC and BMP (or CMP) results within 7 days for inpatient and 30 days for outpatient:   If patient is on warfarin (COUMADIN), place order for and obtain PT-INR and document INR is between 2-3   EKG 12-Lead   ECHOCARDIOGRAM COMPLETE   No orders of the defined types were placed in this encounter.   Signed, Cecille Amsterdam, MD, MPH, [facility]. 10/06/2023 3:15 PM    [facility]

## 2023-10-06 NOTE — Patient Instructions (Signed)
 Medication Instructions:  Your physician recommends that you continue on your current medications as directed. Please refer to the Current Medication list given to you today.  *If you need a refill on your cardiac medications before your next appointment, please call your pharmacy*  Lab Work: Your physician recommends that you return for lab work in:   Labs today: CBC, CMP, Magnesium, Pro BNP  If you have labs (blood work) drawn today and your tests are completely normal, you will receive your results only by: MyChart Message (if you have MyChart) OR A paper copy in the mail If you have any lab test that is abnormal or we need to change your treatment, we will call you to review the results.  Testing/Procedures: Your physician has requested that you have an echocardiogram. Echocardiography is a painless test that uses sound waves to create images of your heart. It provides your doctor with information about the size and shape of your heart and how well your heart's chambers and valves are working. This procedure takes approximately one hour. There are no restrictions for this procedure. Please do NOT wear cologne, perfume, aftershave, or lotions (deodorant is allowed). Please arrive 15 minutes prior to your appointment time.  Please note: We ask at that you not bring children with you during ultrasound (echo/ vascular) testing. Due to room size and safety concerns, children are not allowed in the ultrasound rooms during exams. Our front office staff cannot provide observation of children in our lobby area while testing is being conducted. An adult accompanying a patient to their appointment will only be allowed in the ultrasound room at the discretion of the ultrasound technician under special circumstances. We apologize for any inconvenience.    Dear Anthony Poole  You are scheduled for a Cardioversion on Monday, April 28 with Dr. Royann Shivers.  Please arrive at the [facility] (Main Entrance  A) at [facility]: [address] [place], [place] [address] at 6:00 AM (This time is 1.5 hour(s) before your procedure to ensure your preparation).   Free valet parking service is available. You will check in at ADMITTING.   *Please Note: You will receive a call the day before your procedure to confirm the appointment time. That time may have changed from the original time based on the schedule for that day.*   DIET:  Nothing to eat or drink after midnight except a sip of water with medications (see medication instructions below)  MEDICATION INSTRUCTIONS: !!IF ANY NEW MEDICATIONS ARE STARTED AFTER TODAY, PLEASE NOTIFY YOUR PROVIDER AS SOON AS POSSIBLE!!  FYI: Medications such as Semaglutide (Ozempic, [place]), Tirzepatide (Mounjaro, Zepbound), Dulaglutide (Trulicity), etc ("GLP1 agonists") AND Canagliflozin (Invokana), Dapagliflozin (Farxiga), Empagliflozin (Jardiance), Ertugliflozin (Steglatro), Bexagliflozin [employer]) or any combination with one of these drugs such as Invokamet (Canagliflozin/Metformin), Synjardy (Empagliflozin/Metformin), etc ("SGLT2 inhibitors") must be held around the time of a procedure. This is not a comprehensive list of all of these drugs. Please review all of your medications and talk to your provider if you take any one of these. If you are not sure, ask your provider.   Continue taking your anticoagulant (blood thinner): Apixaban (Eliquis).  You will need to continue this after your procedure until you are told by your provider that it is safe to stop.    LABS: CBC/CMP lab work completed on 10/06/23 in [place] office    FYI:  For your safety, and to allow [place] to monitor your vital signs accurately during the surgery/procedure we request:  If you have artificial nails, gel coating, SNS etc, please have those removed prior to your surgery/procedure. Not having the nail coverings /polish removed may result in cancellation or delay of your  surgery/procedure.  Your support person will be asked to wait in the waiting room during your procedure.  It is OK to have someone drop you off and come back when you are ready to be discharged.  You cannot drive after the procedure and will need someone to drive you home.  Bring your insurance cards.  *Special Note: Every effort is made to have your procedure done on time. Occasionally there are emergencies that occur at the hospital that may cause delays. Please be patient if a delay does occur.      Follow-Up: At [facility], you and your health needs are our priority.  As part of our continuing mission to provide you with exceptional heart care, our providers are all part of one team.  This team includes your primary Cardiologist (physician) and Advanced Practice Providers or APPs (Physician Assistants and Nurse Practitioners) who all work together to provide you with the care you need, when you need it.  Your next appointment:   2 month(s)  Provider:   Huntley Dec, MD    We recommend signing up for the patient portal called "MyChart".  Sign up information is provided on this After Visit Summary.  MyChart is used to connect with patients for Virtual Visits (Telemedicine).  Patients are able to view lab/test results, encounter notes, upcoming appointments, etc.  Non-urgent messages can be sent to your provider as well.   To learn more about what you can do with MyChart, go to [email or web address].   Other Instructions None

## 2023-10-07 LAB — COMPREHENSIVE METABOLIC PANEL WITH GFR
ALT: 15 IU/L (ref 0–44)
AST: 20 IU/L (ref 0–40)
Albumin: 3.9 g/dL (ref 3.7–4.7)
Alkaline Phosphatase: 88 IU/L (ref 44–121)
BUN/Creatinine Ratio: 11 (ref 10–24)
BUN: 13 mg/dL (ref 8–27)
Bilirubin Total: 0.6 mg/dL (ref 0.0–1.2)
CO2: 21 mmol/L (ref 20–29)
Calcium: 9.3 mg/dL (ref 8.6–10.2)
Chloride: 106 mmol/L (ref 96–106)
Creatinine, Ser: 1.23 mg/dL (ref 0.76–1.27)
Globulin, Total: 2.7 g/dL (ref 1.5–4.5)
Glucose: 84 mg/dL (ref 70–99)
Potassium: 4.4 mmol/L (ref 3.5–5.2)
Sodium: 141 mmol/L (ref 134–144)
Total Protein: 6.6 g/dL (ref 6.0–8.5)
eGFR: 59 mL/min/{1.73_m2} — ABNORMAL LOW (ref >59)

## 2023-10-07 LAB — CBC
Hematocrit: 43 % (ref 37.5–51.0)
Hemoglobin: 14.5 g/dL (ref 13.0–17.7)
MCH: 32.9 pg (ref 26.6–33.0)
MCHC: 33.7 g/dL (ref 31.5–35.7)
MCV: 98 fL — ABNORMAL HIGH (ref 79–97)
Platelets: 232 10*3/uL (ref 150–450)
RBC: 4.41 x10E6/uL (ref 4.14–5.80)
RDW: 12.7 % (ref 11.6–15.4)
WBC: 7 10*3/uL (ref 3.4–10.8)

## 2023-10-07 LAB — MAGNESIUM: Magnesium: 2.3 mg/dL (ref 1.6–2.3)

## 2023-10-07 LAB — PRO B NATRIURETIC PEPTIDE: NT-Pro BNP: 674 pg/mL — ABNORMAL HIGH (ref 0–486)

## 2023-10-20 ENCOUNTER — Ambulatory Visit

## 2023-10-20 DIAGNOSIS — I4891 Unspecified atrial fibrillation: Secondary | ICD-10-CM | POA: Insufficient documentation

## 2023-10-20 DIAGNOSIS — R600 Localized edema: Secondary | ICD-10-CM | POA: Diagnosis not present

## 2023-10-20 DIAGNOSIS — R0600 Dyspnea, unspecified: Secondary | ICD-10-CM | POA: Diagnosis not present

## 2023-10-20 LAB — ECHOCARDIOGRAM COMPLETE
AR max vel: 2.02 cm2
AV Area VTI: 2.04 cm2
AV Area mean vel: 1.91 cm2
AV Mean grad: 5 mmHg
AV Peak grad: 9.5 mmHg
Ao pk vel: 1.54 m/s
Area-P 1/2: 2.21 cm2
MV M vel: 2.89 m/s
MV Peak grad: 33.4 mmHg
S' Lateral: 4 cm

## 2023-10-24 ENCOUNTER — Telehealth: Payer: Self-pay

## 2023-10-24 NOTE — Telephone Encounter (Signed)
 Left voice message.

## 2023-10-24 NOTE — Telephone Encounter (Signed)
 Follow Up:     Patient would like for Dr Dr Madireddy to please him and discuss his Echo results or if he needs to come in to see him for the results. He wants to determine if he needs the procedure at the hospital.t

## 2023-10-25 NOTE — Telephone Encounter (Signed)
 Pt called and states that he wish to cancel his cardioversion. Pt states that he would like to try medication. Cardioversion cancelled. Pt states he is glad to come in for an appt.

## 2023-10-30 ENCOUNTER — Encounter (HOSPITAL_COMMUNITY): Admission: RE | Payer: Self-pay | Source: Home / Self Care

## 2023-10-30 ENCOUNTER — Ambulatory Visit (HOSPITAL_COMMUNITY): Admission: RE | Admit: 2023-10-30 | Source: Home / Self Care | Admitting: Cardiovascular Disease

## 2023-10-30 DIAGNOSIS — I4819 Other persistent atrial fibrillation: Secondary | ICD-10-CM

## 2023-10-30 SURGERY — CARDIOVERSION (CATH LAB)
Anesthesia: General

## 2023-11-01 ENCOUNTER — Other Ambulatory Visit

## 2023-11-30 ENCOUNTER — Other Ambulatory Visit: Payer: Self-pay | Admitting: Family Medicine

## 2023-12-05 ENCOUNTER — Other Ambulatory Visit: Payer: Self-pay

## 2023-12-05 DIAGNOSIS — I10 Essential (primary) hypertension: Secondary | ICD-10-CM | POA: Insufficient documentation

## 2023-12-06 ENCOUNTER — Ambulatory Visit

## 2023-12-06 VITALS — BP 138/88 | HR 57 | Ht 75.0 in | Wt 221.8 lb

## 2023-12-06 DIAGNOSIS — I4819 Other persistent atrial fibrillation: Secondary | ICD-10-CM | POA: Insufficient documentation

## 2023-12-06 DIAGNOSIS — I10 Essential (primary) hypertension: Secondary | ICD-10-CM | POA: Insufficient documentation

## 2023-12-06 NOTE — Assessment & Plan Note (Addendum)
 Well-controlled. Target below 140/90 mmHg. Continue current medication amlodipine  5 mg once daily. Continue valsartan  40 mg once daily  Advised him to reduce salt intake given dependent edema which could be related to amlodipine  use but also could be a sign of fluid retention in the setting of diastolic dysfunction.

## 2023-12-06 NOTE — Patient Instructions (Signed)

## 2023-12-06 NOTE — Progress Notes (Signed)
 Cardiology Consultation:    Date:  12/06/2023   ID:  [name], DOB 07-16-1940, MRN [id number]  PCP:  Neda Balk, MD  Cardiologist:  Daymon Evans Garik Diamant, MD   Referring MD: Neda Balk, MD   No chief complaint on file.    ASSESSMENT AND PLAN:   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  Non-smoker, does not drink alcohol. Here for follow-up visit Problem List Items Addressed This Visit     Essential hypertension   Well-controlled. Target below 140/90 mmHg. Continue current medication amlodipine  5 mg once daily. Continue valsartan  40 mg once daily  Advised him to reduce salt intake given dependent edema which could be related to amlodipine  use but also could be a sign of fluid retention in the setting of diastolic dysfunction.        Persistent atrial fibrillation (HCC) - Primary   CHA2DS2-VASc score 3. Initially diagnosed, device identified using Kardia mobile at home, asymptomatic. Remains asymptomatic. Rates relatively slow. Has good health literacy with his wife also dealing with similar diagnosis of atrial fibrillation.  Continue with anticoagulation using Eliquis  5 mg twice daily. Tolerating well without any side effects. His weight is greater than 60 kg and last blood work 10/06/2023 with creatinine 1.23 and EGFR 59, continue with current dose of Eliquis .  With regards to rhythm control once again discussed options to pursue rhythm control and recommended but given his asymptomatic nature and his hesitancy to pursue any procedures he wishes to hold off. Considering his elderly age and good rate control and asymptomatic nature of A-fib I think this is reasonable and continue with rate control strategy.        Relevant Orders   EKG  12-Lead (Completed)   Return to clinic tentatively in 6 months   History of Present Illness:    Anthony Poole is a 83 y.o. male who is being seen today for follow-up visit. PCP is Neda Balk, MD. Last visit with me in the office was 10-06-2023.  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  Non-smoker, does not drink alcohol.  Initially established care after finding of atrial fibrillation on Kardia mobile device at home and later confirmed at PCPs office and EKG. At last office visit we further discussed with cardioversion for rhythm control, he decided to hold off.  Here for the visit today accompanied by his wife. Mentions overall he has been doing well.  Does not sense the irregular heartbeat, no lightheadedness, dizziness or syncopal episodes. Has mild bilateral lower extremity pitting pedal edema during the day as he stays up, relieves overnight after he has his feet elevated. Denies any orthopnea or paroxysmal nocturnal dyspnea. Denies any chest pain. Other than some age-related slowing down in activity he feels well. Does have mild increase in urination.  No dysuria no hematuria.  Continues to take medications consistently as prescribed.  Denies any blood in urine or stools.  EKG in the clinic today shows atrial fibrillation with ventricular rate 57/min, QRS duration 102 ms, inferior Q waves.  No ST-T changes to suggest ischemia.  In comparison to prior EKG from October 06, 2023 inferior Q waves is a new.  Echocardiogram from 10-24-2023 noted LVEF 55 to 60%, mild LVH, GLS -13.4, normal RV size and function, mild MR, mildly dilated left atrium, ascending aorta measured 4.1 cm in size  Blood work from 10/06/2023 with NT-proBNP mildly elevated 674. Magnesium  2.3 CBC unremarkable with  hemoglobin 14.5 and hematocrit 43 BUN 13, creatinine 1.23, EGFR 59 Normal electrolytes, transaminases and alkaline phosphatase.  Past Medical History:  Diagnosis Date   Acute pancreatitis 10/25/2016   Allergy 10/20/2013   Anemia 07/29/2012   Bilateral lower extremity edema 10/06/2023   Chronic knee pain 11/21/2012   Left s/p torn cartilage   Constipation 10/26/2016   Costochondritis 12/12/2016   Epistaxis 09/30/2016   Essential hypertension 04/02/2012   Foot swelling 08/10/2015   GERD (gastroesophageal reflux disease)    Gout of foot 06/05/2016   Headache 04/20/2014   Hyperglycemia 06/05/2016   Hyperlipidemia, mixed 04/14/2013   Hypertension    Hypocalcemia 03/16/2017   Low back pain radiating down leg 04/29/2012   Medicare annual wellness visit, subsequent 04/14/2014   New onset a-fib [facility]) 10/01/2023   Obesity 10/20/2014   Pancreatitis 10/22/2016   Preventative health care 07/29/2012   Rash 11/19/2015   Right knee pain 11/21/2012   Left s/p torn cartilage     Shingles 12/12/2012   Sinusitis, acute 09/05/2012   Tachycardia 10/14/2014   TIA (transient ischemic attack) 09/05/2012   No acute infarct.   Periventricular white matter type changes probably related to   result of small vessel disease.   Left vertebral artery poorly delineated and may be diminutive in   size possibly congenitally small although atherosclerotic type   changes not excluded.   Minimal to mild paranasal sinus mucosal thickening most notable   inferior aspect of the left maxillary sinus.         Tick bite 11/19/2015   Tinea pedis 07/23/2021    Past Surgical History:  Procedure Laterality Date   CHOLECYSTECTOMY      Current Medications: Current Meds  Medication Sig   allopurinol  (ZYLOPRIM ) 100 MG tablet Take 1 tablet (100 mg total) by mouth daily.   amLODipine  (NORVASC ) 5 MG tablet TAKE 1 TABLET (5 MG TOTAL) BY MOUTH DAILY.   apixaban  (ELIQUIS ) 5 MG TABS tablet Take 1 tablet (5 mg total) by  mouth 2 (two) times daily.   MAGNESIUM  PO Take 1 tablet by mouth daily.   Misc Natural Products (OSTEO BI-FLEX ADV JOINT SHIELD PO) Take 1 tablet by mouth daily.   Multiple Vitamins-Minerals (CENTRUM SILVER ADULT 50+ PO) Take 1 tablet by mouth daily.   Probiotic Product (PROBIOTIC DAILY) CAPS Take 1 capsule by mouth daily. [facility]   valsartan  (DIOVAN ) 40 MG tablet TAKE 1 TABLET BY MOUTH EVERY DAY     Allergies:   Fish allergy and Propulsid [cisapride]   Social History   Socioeconomic History   Marital status: Married    Spouse name: Not on file   Number of children: Not on file   Years of education: Not on file   Highest education level: Not on file  Occupational History   Not on file  Tobacco Use   Smoking status: Never   Smokeless tobacco: Never  Substance and Sexual Activity   Alcohol use: No   Drug use: No   Sexual activity: Not Currently    Birth control/protection: Condom    Comment: lives with wife, no dietary restrictions,  no fish  Other Topics Concern   Not on file  Social History Narrative  Not on file   Social Drivers of Health   Financial Resource Strain: Patient Declined (02/08/2023)   Overall Financial Resource Strain (CARDIA)    Difficulty of Paying Living Expenses: Patient declined  Food Insecurity: No Food Insecurity (02/08/2023)   Hunger Vital Sign    Worried About Running Out of Food in the Last Year: Never true    Ran Out of Food in the Last Year: Never true  Transportation Needs: No Transportation Needs (02/08/2023)   PRAPARE - Administrator, Civil Service (Medical): No    Lack of Transportation (Non-Medical): No  Physical Activity: Patient Declined (02/08/2023)   Exercise Vital Sign    Days of Exercise per Week: Patient declined    Minutes of Exercise per Session: Patient declined  Stress: Patient Declined (02/08/2023)   [name] of Occupational Health - Occupational Stress Questionnaire    Feeling of Stress :  Patient declined  Social Connections: Unknown (02/08/2023)   Social Connection and Isolation Panel [NHANES]    Frequency of Communication with Friends and Family: More than three times a week    Frequency of Social Gatherings with Friends and Family: More than three times a week    Attends Religious Services: Not on Marketing executive or Organizations: Patient declined    Attends Banker Meetings: Patient declined    Marital Status: Married     Family History: The patient's family history includes COPD in his mother; Dementia in his father; Peripheral vascular disease in his brother and mother. ROS:   Please see the history of present illness.    All 14 point review of systems negative except as described per history of present illness.  EKGs/Labs/Other Studies Reviewed:    The following studies were reviewed today:   EKG:  EKG Interpretation Date/Time:  Wednesday December 06 2023 13:17:03 EDT Ventricular Rate:  57 PR Interval:    QRS Duration:  102 QT Interval:  432 QTC Calculation: 420 R Axis:   -4  Text Interpretation: Atrial fibrillation with slow ventricular response Possible Inferior infarct , age undetermined Abnormal ECG When compared with ECG of 06-Oct-2023 13:47, Borderline criteria for Inferior infarct are now Present Inverted T waves have replaced nonspecific T wave abnormality in Inferior leads Confirmed by Bertha Broad reddy [phone]) on 12/06/2023 1:21:08 PM    Recent Labs: 01/23/2023: TSH 1.45 10/06/2023: ALT 15; BUN 13; Creatinine, Ser 1.23; Hemoglobin 14.5; Magnesium  2.3; NT-Pro BNP 674; Platelets 232; Potassium 4.4; Sodium 141  Recent Lipid Panel    Component Value Date/Time   CHOL 202 (H) 01/23/2023 1556   TRIG 89.0 01/23/2023 1556   HDL 51.60 01/23/2023 1556   CHOLHDL 4 01/23/2023 1556   VLDL 17.8 01/23/2023 1556   LDLCALC 133 (H) 01/23/2023 1556   LDLCALC 106 (H) 03/05/2020 1401    Physical Exam:    VS:  BP 138/88   Pulse (!)  57   Ht 6\' 3"  (1.905 m)   Wt 221 lb 12.8 oz (100.6 kg)   SpO2 97%   BMI 27.72 kg/m     Wt Readings from Last 3 Encounters:  12/06/23 221 lb 12.8 oz (100.6 kg)  10/06/23 224 lb 3.2 oz (101.7 kg)  09/28/23 226 lb 3.2 oz (102.6 kg)     GENERAL:  Well nourished, well developed in no acute distress NECK: No JVD; No carotid bruits CARDIAC: Irregularly regular, S1 and S2 present, no murmurs, no rubs, no gallops CHEST:  Clear to  auscultation without rales, wheezing or rhonchi  Extremities: Trace bilateral pitting ankle edema. Pulses bilaterally symmetric with radial 2+ and dorsalis pedis 2+ NEUROLOGIC:  Alert and oriented x 3  Medication Adjustments/Labs and Tests Ordered: Current medicines are reviewed at length with the patient today.  Concerns regarding medicines are outlined above.  Orders Placed This Encounter  Procedures   EKG 12-Lead   No orders of the defined types were placed in this encounter.   Signed, Lura Sallies, MD, MPH, [facility]. 12/06/2023 1:40 PM    [facility]

## 2023-12-06 NOTE — Assessment & Plan Note (Signed)
 CHA2DS2-VASc score 3. Initially diagnosed, device identified using Kardia mobile at home, asymptomatic. Remains asymptomatic. Rates relatively slow. Has good health literacy with his wife also dealing with similar diagnosis of atrial fibrillation.  Continue with anticoagulation using Eliquis  5 mg twice daily. Tolerating well without any side effects. His weight is greater than 60 kg and last blood work 10/06/2023 with creatinine 1.23 and EGFR 59, continue with current dose of Eliquis .  With regards to rhythm control once again discussed options to pursue rhythm control and recommended but given his asymptomatic nature and his hesitancy to pursue any procedures he wishes to hold off. Considering his elderly age and good rate control and asymptomatic nature of A-fib I think this is reasonable and continue with rate control strategy.

## 2024-01-21 NOTE — Assessment & Plan Note (Signed)
 Well controlled, no changes to meds. Encouraged heart healthy diet such as the DASH diet and exercise as tolerated.

## 2024-01-21 NOTE — Assessment & Plan Note (Signed)
 Hydrate and monitor

## 2024-01-21 NOTE — Assessment & Plan Note (Signed)
 hgba1c acceptable, minimize simple carbs. Increase exercise as tolerated.

## 2024-01-21 NOTE — Assessment & Plan Note (Signed)
 Mildly eleved, no changes to meds, monitor resting blood pressure at home and report if above 140/90 consistently. Encouraged heart healthy diet such as the DASH diet and exercise as tolerated.

## 2024-01-21 NOTE — Assessment & Plan Note (Signed)
 Encourage heart healthy diet such as MIND or DASH diet, increase exercise, avoid trans fats, simple carbohydrates and processed foods, consider a krill or fish or flaxseed oil cap daily.

## 2024-01-22 ENCOUNTER — Ambulatory Visit (INDEPENDENT_AMBULATORY_CARE_PROVIDER_SITE_OTHER): Payer: Medicare Other | Admitting: Family Medicine

## 2024-01-22 ENCOUNTER — Encounter: Payer: Self-pay | Admitting: Family Medicine

## 2024-01-22 VITALS — BP 128/68 | HR 50 | Resp 16 | Ht 75.0 in | Wt 218.0 lb

## 2024-01-22 DIAGNOSIS — M109 Gout, unspecified: Secondary | ICD-10-CM | POA: Diagnosis not present

## 2024-01-22 DIAGNOSIS — R739 Hyperglycemia, unspecified: Secondary | ICD-10-CM

## 2024-01-22 DIAGNOSIS — I10 Essential (primary) hypertension: Secondary | ICD-10-CM | POA: Diagnosis not present

## 2024-01-22 DIAGNOSIS — E782 Mixed hyperlipidemia: Secondary | ICD-10-CM | POA: Diagnosis not present

## 2024-01-22 NOTE — Progress Notes (Signed)
 Subjective:    Patient ID: Anthony Poole, male    DOB: 01-05-1941, 83 y.o.   MRN: [id number]  Chief Complaint  Patient presents with   Medical Management of Chronic Issues    Patient presents today for a 4 month follow-up.   Quality Metric Gaps    AWV, zoster, TDAP    HPI Patient is in today for follow up on chronic medical concerns. No recent febrile illness or hospitalizations. Denies CP/palp/SOB/HA/congestion/fevers/GI or GU c/o. Taking meds as prescribed. He reports he is doing well. No acute concerns. He is staying busy and maintaining a good amount of activity. He tries to maintain a heart healthy diet  Past Medical History:  Diagnosis Date   Acute pancreatitis 10/25/2016   Allergy 10/20/2013   Anemia 07/29/2012   Bilateral lower extremity edema 10/06/2023   Chronic knee pain 11/21/2012   Left s/p torn cartilage   Constipation 10/26/2016   Costochondritis 12/12/2016   Epistaxis 09/30/2016   Essential hypertension 04/02/2012   Foot swelling 08/10/2015   GERD (gastroesophageal reflux disease)    Gout of foot 06/05/2016   Headache 04/20/2014   Hyperglycemia 06/05/2016   Hyperlipidemia, mixed 04/14/2013   Hypertension    Hypocalcemia 03/16/2017   Low back pain radiating down leg 04/29/2012   Medicare annual wellness visit, subsequent 04/14/2014   New onset a-fib [facility]) 10/01/2023   Obesity 10/20/2014   Pancreatitis 10/22/2016   Preventative health care 07/29/2012   Rash 11/19/2015   Right knee pain 11/21/2012   Left s/p torn cartilage     Shingles 12/12/2012   Sinusitis, acute 09/05/2012   Tachycardia 10/14/2014   TIA (transient ischemic attack) 09/05/2012   No acute infarct.   Periventricular white matter type changes probably related to   result of small vessel disease.   Left vertebral artery poorly delineated and may be diminutive in   size possibly congenitally small although atherosclerotic type   changes not excluded.   Minimal to mild paranasal sinus  mucosal thickening most notable   inferior aspect of the left maxillary sinus.         Tick bite 11/19/2015   Tinea pedis 07/23/2021    Past Surgical History:  Procedure Laterality Date   CHOLECYSTECTOMY      Family History  Problem Relation Age of Onset   COPD Mother        worked in [employer], clots   Peripheral vascular disease Mother    Dementia Father    Peripheral vascular disease Brother     Social History   Socioeconomic History   Marital status: Married    Spouse name: Not on file   Number of children: Not on file   Years of education: Not on file   Highest education level: 12th grade  Occupational History   Not on file  Tobacco Use   Smoking status: Never   Smokeless tobacco: Never  Substance and Sexual Activity   Alcohol use: No   Drug use: No   Sexual activity: Not Currently    Birth control/protection: Condom    Comment: lives with wife, no dietary restrictions,  no fish  Other Topics Concern   Not on file  Social History Narrative   Not on file   Social Drivers of Health   Financial Resource Strain: Low Risk  (01/21/2024)   Overall Financial Resource Strain (CARDIA)    Difficulty of Paying Living Expenses: Not hard at all  Food Insecurity: No Food Insecurity (01/21/2024)  Hunger Vital Sign    Worried About Running Out of Food in the Last Year: Never true    Ran Out of Food in the Last Year: Never true  Transportation Needs: No Transportation Needs (01/21/2024)   PRAPARE - Administrator, Civil Service (Medical): No    Lack of Transportation (Non-Medical): No  Physical Activity: Unknown (01/21/2024)   Exercise Vital Sign    Days of Exercise per Week: 7 days    Minutes of Exercise per Session: Patient declined  Stress: No Stress Concern Present (01/21/2024)   [name] of Occupational Health - Occupational Stress Questionnaire    Feeling of Stress: Only a little  Social Connections: Unknown (01/21/2024)   Social Connection  and Isolation Panel    Frequency of Communication with Friends and Family: More than three times a week    Frequency of Social Gatherings with Friends and Family: Three times a week    Attends Religious Services: Patient declined    Active Member of Clubs or Organizations: Patient declined    Attends Banker Meetings: Not on file    Marital Status: Married  Intimate Partner Violence: Not At Risk (02/20/2023)   Humiliation, Afraid, Rape, and Kick questionnaire    Fear of Current or Ex-Partner: No    Emotionally Abused: No    Physically Abused: No    Sexually Abused: No    Outpatient Medications Prior to Visit  Medication Sig Dispense Refill   allopurinol  (ZYLOPRIM ) 100 MG tablet Take 1 tablet (100 mg total) by mouth daily. 90 tablet 1   amLODipine  (NORVASC ) 5 MG tablet TAKE 1 TABLET (5 MG TOTAL) BY MOUTH DAILY. 90 tablet 1   apixaban  (ELIQUIS ) 5 MG TABS tablet Take 1 tablet (5 mg total) by mouth 2 (two) times daily. 60 tablet 3   MAGNESIUM  PO Take 1 tablet by mouth daily.     Misc Natural Products (OSTEO BI-FLEX ADV JOINT SHIELD PO) Take 1 tablet by mouth daily.     Multiple Vitamins-Minerals (CENTRUM SILVER ADULT 50+ PO) Take 1 tablet by mouth daily.     Probiotic Product (PROBIOTIC DAILY) CAPS Take 1 capsule by mouth daily. [facility]     valsartan  (DIOVAN ) 40 MG tablet TAKE 1 TABLET BY MOUTH EVERY DAY 90 tablet 1   No facility-administered medications prior to visit.    Allergies  Allergen Reactions   Fish Allergy Itching   Propulsid [Cisapride] Hives    Review of Systems  Constitutional:  Negative for fever and malaise/fatigue.  HENT:  Negative for congestion.   Eyes:  Negative for blurred vision.  Respiratory:  Negative for shortness of breath.   Cardiovascular:  Negative for chest pain, palpitations and leg swelling.  Gastrointestinal:  Negative for abdominal pain, blood in stool and nausea.  Genitourinary:  Negative for dysuria and frequency.   Musculoskeletal:  Negative for falls.  Skin:  Negative for rash.  Neurological:  Negative for dizziness, loss of consciousness and headaches.  Endo/Heme/Allergies:  Negative for environmental allergies.  Psychiatric/Behavioral:  Negative for depression. The patient is not nervous/anxious.        Objective:    Physical Exam Vitals reviewed.  Constitutional:      Appearance: Normal appearance. He is not ill-appearing.  HENT:     Head: Normocephalic and atraumatic.     Nose: Nose normal.  Eyes:     Conjunctiva/sclera: Conjunctivae normal.  Cardiovascular:     Rate and Rhythm: Normal rate. Rhythm irregular.  Pulses: Normal pulses.     Heart sounds: Normal heart sounds. No murmur heard. Pulmonary:     Effort: Pulmonary effort is normal.     Breath sounds: Normal breath sounds. No wheezing.  Abdominal:     Palpations: Abdomen is soft. There is no mass.     Tenderness: There is no abdominal tenderness.  Musculoskeletal:     Cervical back: Normal range of motion.     Right lower leg: No edema.     Left lower leg: No edema.  Skin:    General: Skin is warm and dry.  Neurological:     General: No focal deficit present.     Mental Status: He is alert and oriented to person, place, and time.  Psychiatric:        Mood and Affect: Mood normal.     BP 128/68   Pulse (!) 50   Resp 16   Ht 6' 3 (1.905 m)   Wt 218 lb (98.9 kg)   SpO2 96%   BMI 27.25 kg/m  Wt Readings from Last 3 Encounters:  01/22/24 218 lb (98.9 kg)  12/06/23 221 lb 12.8 oz (100.6 kg)  10/06/23 224 lb 3.2 oz (101.7 kg)    Diabetic Foot Exam - Simple   No data filed    Lab Results  Component Value Date   WBC 7.0 10/06/2023   HGB 14.5 10/06/2023   HCT 43.0 10/06/2023   PLT 232 10/06/2023   GLUCOSE 84 10/06/2023   CHOL 202 (H) 01/23/2023   TRIG 89.0 01/23/2023   HDL 51.60 01/23/2023   LDLCALC 133 (H) 01/23/2023   ALT 15 10/06/2023   AST 20 10/06/2023   NA 141 10/06/2023   K 4.4 10/06/2023    CL 106 10/06/2023   CREATININE 1.23 10/06/2023   BUN 13 10/06/2023   CO2 21 10/06/2023   TSH 1.45 01/23/2023   PSA 0.56 07/22/2021   INR 1.13 10/27/2016   HGBA1C 5.8 01/23/2023    Lab Results  Component Value Date   TSH 1.45 01/23/2023   Lab Results  Component Value Date   WBC 7.0 10/06/2023   HGB 14.5 10/06/2023   HCT 43.0 10/06/2023   MCV 98 (H) 10/06/2023   PLT 232 10/06/2023   Lab Results  Component Value Date   NA 141 10/06/2023   K 4.4 10/06/2023   CO2 21 10/06/2023   GLUCOSE 84 10/06/2023   BUN 13 10/06/2023   CREATININE 1.23 10/06/2023   BILITOT 0.6 10/06/2023   ALKPHOS 88 10/06/2023   AST 20 10/06/2023   ALT 15 10/06/2023   PROT 6.6 10/06/2023   ALBUMIN 3.9 10/06/2023   CALCIUM 9.3 10/06/2023   ANIONGAP 11 10/27/2016   EGFR 59 (L) 10/06/2023   GFR 50.73 (L) 01/23/2023   Lab Results  Component Value Date   CHOL 202 (H) 01/23/2023   Lab Results  Component Value Date   HDL 51.60 01/23/2023   Lab Results  Component Value Date   LDLCALC 133 (H) 01/23/2023   Lab Results  Component Value Date   TRIG 89.0 01/23/2023   Lab Results  Component Value Date   CHOLHDL 4 01/23/2023   Lab Results  Component Value Date   HGBA1C 5.8 01/23/2023       Assessment & Plan:  Essential hypertension Assessment & Plan: Mildly eleved, no changes to meds, monitor resting blood pressure at home and report if above 140/90 consistently. Encouraged heart healthy diet such as the DASH diet and  exercise as tolerated.   Orders: -     Comprehensive metabolic panel with GFR -     CBC with Differential/Platelet -     TSH  Hyperglycemia Assessment & Plan: hgba1c acceptable, minimize simple carbs. Increase exercise as tolerated.   Orders: -     Hemoglobin A1c  Hyperlipidemia, mixed Assessment & Plan: Encourage heart healthy diet such as MIND or DASH diet, increase exercise, avoid trans fats, simple carbohydrates and processed foods, consider a krill or fish or  flaxseed oil cap daily.   Orders: -     Lipid panel  Hypertension, unspecified type Assessment & Plan: Well controlled, no changes to meds. Encouraged heart healthy diet such as the DASH diet and exercise as tolerated.     Gout of foot, unspecified cause, unspecified chronicity, unspecified laterality Assessment & Plan: Hydrate and monitor   Orders: -     Uric acid    Harlene Horton, MD

## 2024-01-23 ENCOUNTER — Ambulatory Visit: Payer: Self-pay | Admitting: Family Medicine

## 2024-01-23 LAB — LIPID PANEL
Cholesterol: 181 mg/dL (ref 0–200)
HDL: 53.4 mg/dL (ref >39.00)
LDL Cholesterol: 113 mg/dL — ABNORMAL HIGH (ref 0–99)
NonHDL: 127.1
Total CHOL/HDL Ratio: 3
Triglycerides: 71 mg/dL (ref 0.0–149.0)
VLDL: 14.2 mg/dL (ref 0.0–40.0)

## 2024-01-23 LAB — COMPREHENSIVE METABOLIC PANEL WITH GFR
ALT: 15 U/L (ref 0–53)
AST: 21 U/L (ref 0–37)
Albumin: 4.1 g/dL (ref 3.5–5.2)
Alkaline Phosphatase: 65 U/L (ref 39–117)
BUN: 16 mg/dL (ref 6–23)
CO2: 27 meq/L (ref 19–32)
Calcium: 9.2 mg/dL (ref 8.4–10.5)
Chloride: 105 meq/L (ref 96–112)
Creatinine, Ser: 1.3 mg/dL (ref 0.40–1.50)
GFR: 51.31 mL/min — ABNORMAL LOW (ref >60.00)
Glucose, Bld: 86 mg/dL (ref 70–99)
Potassium: 4.1 meq/L (ref 3.5–5.1)
Sodium: 139 meq/L (ref 135–145)
Total Bilirubin: 0.6 mg/dL (ref 0.2–1.2)
Total Protein: 6.8 g/dL (ref 6.0–8.3)

## 2024-01-23 LAB — CBC WITH DIFFERENTIAL/PLATELET
Basophils Absolute: 0.1 K/uL (ref 0.0–0.1)
Basophils Relative: 1.1 % (ref 0.0–3.0)
Eosinophils Absolute: 0.1 K/uL (ref 0.0–0.7)
Eosinophils Relative: 1.5 % (ref 0.0–5.0)
HCT: 43.2 % (ref 39.0–52.0)
Hemoglobin: 14.3 g/dL (ref 13.0–17.0)
Lymphocytes Relative: 29.8 % (ref 12.0–46.0)
Lymphs Abs: 1.5 K/uL (ref 0.7–4.0)
MCHC: 33.1 g/dL (ref 30.0–36.0)
MCV: 98.1 fl (ref 78.0–100.0)
Monocytes Absolute: 0.5 K/uL (ref 0.1–1.0)
Monocytes Relative: 9.3 % (ref 3.0–12.0)
Neutro Abs: 3 K/uL (ref 1.4–7.7)
Neutrophils Relative %: 58.3 % (ref 43.0–77.0)
Platelets: 234 K/uL (ref 150.0–400.0)
RBC: 4.4 Mil/uL (ref 4.22–5.81)
RDW: 13.7 % (ref 11.5–15.5)
WBC: 5.2 K/uL (ref 4.0–10.5)

## 2024-01-23 LAB — TSH: TSH: 1.4 u[IU]/mL (ref 0.35–5.50)

## 2024-01-23 LAB — URIC ACID: Uric Acid, Serum: 5.7 mg/dL (ref 4.0–7.8)

## 2024-01-23 LAB — HEMOGLOBIN A1C: Hgb A1c MFr Bld: 5.8 % (ref 4.6–6.5)

## 2024-01-23 NOTE — Telephone Encounter (Signed)
 Copied from CRM [id number]. Topic: Clinical - Lab/Test Results >> Jan 23, 2024  4:43 PM Viola F wrote: Reason for CRM: Patient returned Porsha's phone call regarding lab results - I let patient know that Dr. Domenica says Labs stable no new concerns, no changes. Patient had no further questions.

## 2024-01-24 ENCOUNTER — Other Ambulatory Visit: Payer: Self-pay | Admitting: Family Medicine

## 2024-02-21 ENCOUNTER — Ambulatory Visit (INDEPENDENT_AMBULATORY_CARE_PROVIDER_SITE_OTHER)

## 2024-02-21 VITALS — BP 128/68 | Ht 75.0 in | Wt 218.0 lb

## 2024-02-21 DIAGNOSIS — Z0000 Encounter for general adult medical examination without abnormal findings: Secondary | ICD-10-CM

## 2024-02-21 NOTE — Patient Instructions (Signed)
 Anthony Poole , Thank you for taking time out of your busy schedule to complete your Annual Wellness Visit with me. I enjoyed our conversation and look forward to speaking with you again next year. I, as well as your care team,  appreciate your ongoing commitment to your health goals. Please review the following plan we discussed and let me know if I can assist you in the future. Your Game plan/ To Do List    Referrals: If you haven't heard from the office you've been referred to, please reach out to them at the phone provided.   Follow up Visits: We will see or speak with you next year for your Next Medicare AWV with our clinical staff Have you seen your provider in the last 6 months (3 months if uncontrolled diabetes)? No  Clinician Recommendations:  Aim for 30 minutes of exercise or brisk walking, 6-8 glasses of water, and 5 servings of fruits and vegetables each day.       This is a list of the screenings recommended for you:  Health Maintenance  Topic Date Due   Zoster (Shingles) Vaccine (1 of 2) 12/21/1991   DTaP/Tdap/Td vaccine (2 - Td or Tdap) 10/05/2021   Flu Shot  02/02/2024   Medicare Annual Wellness Visit  02/20/2025   Pneumococcal Vaccine for age over 10  Completed   HPV Vaccine  Aged Out   Meningitis B Vaccine  Aged Out   COVID-19 Vaccine  Discontinued    Advanced directives: (Copy Requested) Please bring a copy of your health care power of attorney and living will to the office to be added to your chart at your convenience. You can mail to [facility] [address] [place], [place] [address] or email to [email or web address] Advance Care Planning is important because it:  [x]  Makes sure you receive the medical care that is consistent with your values, goals, and preferences  [x]  It provides guidance to your family and loved ones and reduces their decisional burden about whether or not they are making the right decisions based on your  wishes.  Follow the link provided in your after visit summary or read over the paperwork we have mailed to you to help you started getting your Advance Directives in place. If you need assistance in completing these, please reach out to us  so that we can help you!  See attachments for Preventive Care and Fall Prevention Tips.

## 2024-02-21 NOTE — Progress Notes (Signed)
 Because this visit was a virtual/telehealth visit,  certain criteria was not obtained, such a blood pressure, CBG if applicable, and timed get up and go. Any medications not marked as taking were not mentioned during the medication reconciliation part of the visit. Any vitals not documented were not able to be obtained due to this being a telehealth visit or patient was unable to self-report a recent blood pressure reading due to a lack of equipment at home via telehealth. Vitals that have been documented are verbally provided by the patient.  This visit was performed by a medical professional under my direct supervision. I was immediately available for consultation/collaboration. I have reviewed and agree with the Annual Wellness Visit documentation.  Subjective:   Anthony Poole is a 83 y.o. who presents for a Medicare Wellness preventive visit.  As a reminder, Annual Wellness Visits don't include a physical exam, and some assessments may be limited, especially if this visit is performed virtually. We may recommend an in-person follow-up visit with your provider if needed.  Visit Complete: Virtual I connected with  [name] on 02/21/24 by a audio enabled telemedicine application and verified that I am speaking with the correct person using two identifiers.  Patient Location: Home  Provider Location: Home Office  I discussed the limitations of evaluation and management by telemedicine. The patient expressed understanding and agreed to proceed.  Vital Signs: Because this visit was a virtual/telehealth visit, some criteria may be missing or patient reported. Any vitals not documented were not able to be obtained and vitals that have been documented are patient reported.  VideoDeclined- This patient declined Librarian, academic. Therefore the visit was completed with audio only.  Persons Participating in Visit: Patient.  AWV Questionnaire: No: Patient  Medicare AWV questionnaire was not completed prior to this visit.  Cardiac Risk Factors include: advanced age (>55men, >51 women);male gender;Other (see comment);hypertension, Risk factor comments: tia,afin     Objective:    Today's Vitals   02/21/24 1320  BP: 128/68  Weight: 218 lb (98.9 kg)  Height: 6' 3 (1.905 m)   Body mass index is 27.25 kg/m.     02/21/2024    1:23 PM 02/20/2023    3:41 PM 02/04/2022    3:15 PM 02/01/2021    1:06 PM 10/01/2019    3:04 PM 10/27/2016    1:34 PM 10/22/2016   11:55 AM  Advanced Directives  Does Patient Have a Medical Advance Directive? No Yes Yes No Yes No  No   Type of Special educational needs teacher of [place];Living will Healthcare Power of [place];Living will  Healthcare Power of [place];Living will    Does patient want to make changes to medical advance directive?  No - Patient declined No - Patient declined No - Patient declined No - Patient declined    Copy of Healthcare Power of Attorney in Chart?  No - copy requested No - copy requested  No - copy requested    Would patient like information on creating a medical advance directive? No - Patient declined           Data saved with a previous flowsheet row definition    Current Medications (verified) Outpatient Encounter Medications as of 02/21/2024  Medication Sig   allopurinol  (ZYLOPRIM ) 100 MG tablet Take 1 tablet (100 mg total) by mouth daily.   amLODipine  (NORVASC ) 5 MG tablet TAKE 1 TABLET (5 MG TOTAL) BY MOUTH DAILY.   ELIQUIS  5 MG TABS tablet  TAKE 1 TABLET BY MOUTH TWICE A DAY   MAGNESIUM  PO Take 1 tablet by mouth daily.   Misc Natural Products (OSTEO BI-FLEX ADV JOINT SHIELD PO) Take 1 tablet by mouth daily.   Multiple Vitamins-Minerals (CENTRUM SILVER ADULT 50+ PO) Take 1 tablet by mouth daily.   Probiotic Product (PROBIOTIC DAILY) CAPS Take 1 capsule by mouth daily. [facility]   valsartan  (DIOVAN ) 40 MG tablet TAKE 1 TABLET BY MOUTH EVERY DAY   No  facility-administered encounter medications on file as of 02/21/2024.    Allergies (verified) Fish allergy and Propulsid [cisapride]   History: Past Medical History:  Diagnosis Date   Acute pancreatitis 10/25/2016   Allergy 10/20/2013   Anemia 07/29/2012   Bilateral lower extremity edema 10/06/2023   Chronic knee pain 11/21/2012   Left s/p torn cartilage   Constipation 10/26/2016   Costochondritis 12/12/2016   Epistaxis 09/30/2016   Essential hypertension 04/02/2012   Foot swelling 08/10/2015   GERD (gastroesophageal reflux disease)    Gout of foot 06/05/2016   Headache 04/20/2014   Hyperglycemia 06/05/2016   Hyperlipidemia, mixed 04/14/2013   Hypertension    Hypocalcemia 03/16/2017   Low back pain radiating down leg 04/29/2012   Medicare annual wellness visit, subsequent 04/14/2014   New onset a-fib [facility]) 10/01/2023   Obesity 10/20/2014   Pancreatitis 10/22/2016   Preventative health care 07/29/2012   Rash 11/19/2015   Right knee pain 11/21/2012   Left s/p torn cartilage     Shingles 12/12/2012   Sinusitis, acute 09/05/2012   Tachycardia 10/14/2014   TIA (transient ischemic attack) 09/05/2012   No acute infarct.   Periventricular white matter type changes probably related to   result of small vessel disease.   Left vertebral artery poorly delineated and may be diminutive in   size possibly congenitally small although atherosclerotic type   changes not excluded.   Minimal to mild paranasal sinus mucosal thickening most notable   inferior aspect of the left maxillary sinus.         Tick bite 11/19/2015   Tinea pedis 07/23/2021   Past Surgical History:  Procedure Laterality Date   CHOLECYSTECTOMY     Family History  Problem Relation Age of Onset   COPD Mother        worked in [employer], clots   Peripheral vascular disease Mother    Dementia Father    Peripheral vascular disease Brother    Social History   Socioeconomic History   Marital status: Married     Spouse name: Not on file   Number of children: Not on file   Years of education: Not on file   Highest education level: 12th grade  Occupational History   Not on file  Tobacco Use   Smoking status: Never   Smokeless tobacco: Never  Substance and Sexual Activity   Alcohol use: No   Drug use: No   Sexual activity: Not Currently    Birth control/protection: Condom    Comment: lives with wife, no dietary restrictions,  no fish  Other Topics Concern   Not on file  Social History Narrative   Not on file   Social Drivers of Health   Financial Resource Strain: Low Risk  (01/21/2024)   Overall Financial Resource Strain (CARDIA)    Difficulty of Paying Living Expenses: Not hard at all  Food Insecurity: No Food Insecurity (02/21/2024)   Hunger Vital Sign    Worried About Running Out of Food in the Last  Year: Never true    Ran Out of Food in the Last Year: Never true  Transportation Needs: No Transportation Needs (02/21/2024)   PRAPARE - Administrator, Civil Service (Medical): No    Lack of Transportation (Non-Medical): No  Physical Activity: Sufficiently Active (02/21/2024)   Exercise Vital Sign    Days of Exercise per Week: 7 days    Minutes of Exercise per Session: 30 min  Stress: No Stress Concern Present (02/21/2024)   [name] of Occupational Health - Occupational Stress Questionnaire    Feeling of Stress: Only a little  Social Connections: Unknown (02/21/2024)   Social Connection and Isolation Panel    Frequency of Communication with Friends and Family: More than three times a week    Frequency of Social Gatherings with Friends and Family: Three times a week    Attends Religious Services: Patient declined    Active Member of Clubs or Organizations: Patient declined    Attends Banker Meetings: Patient declined    Marital Status: Married    Tobacco Counseling Counseling given: Not Answered    Clinical Intake:  Pre-visit preparation  completed: Yes  Pain : No/denies pain     BMI - recorded: 27.26 Nutritional Status: BMI 25 -29 Overweight Nutritional Risks: None Diabetes: No  Lab Results  Component Value Date   HGBA1C 5.8 01/22/2024   HGBA1C 5.8 01/23/2023   HGBA1C 5.8 07/25/2022     How often do you need to have someone help you when you read instructions, pamphlets, or other written materials from your doctor or pharmacy?: 1 - Never What is the last grade level you completed in school?: HS graduate  Interpreter Needed?: No  Information entered by :: Ariann Khaimov whtifield,CMA   Activities of Daily Living     02/21/2024   10:53 AM  In your present state of health, do you have any difficulty performing the following activities:  Hearing? 0  Vision? 0  Difficulty concentrating or making decisions? 0  Walking or climbing stairs? 0  Dressing or bathing? 0  Doing errands, shopping? 0  Preparing Food and eating ? N  Using the Toilet? N  In the past six months, have you accidently leaked urine? N  Do you have problems with loss of bowel control? N  Managing your Medications? N  Managing your Finances? N  Housekeeping or managing your Housekeeping? N    Patient Care Team: Domenica Harlene LABOR, MD as PCP - General (Family Medicine)  I have updated your Care Teams any recent Medical Services you may have received from other providers in the past year.     Assessment:   This is a routine wellness examination for Jazper.  Hearing/Vision screen Hearing Screening - Comments:: No difficulties Vision Screening - Comments:: Patient wears glasses    Goals Addressed             This Visit's Progress    Patient Stated       Fleurette working on his roof        Depression Screen     02/21/2024    1:24 PM 02/20/2023    3:44 PM 07/25/2022    1:52 PM 02/04/2022    3:16 PM 01/20/2022    2:34 PM 07/22/2021    3:15 PM 02/01/2021    1:10 PM  PHQ 2/9 Scores  PHQ - 2 Score 0 0 0 0 0 0 0  PHQ- 9 Score 0     0  Fall Risk     02/21/2024   10:53 AM 02/08/2023    3:51 PM 07/25/2022    1:51 PM 02/04/2022    3:16 PM 01/20/2022    2:34 PM  Fall Risk   Falls in the past year? 0 0 0 0 0  Number falls in past yr: 0 0 0 0 0  Injury with Fall? 0 0 0 0 0  Risk for fall due to : No Fall Risks No Fall Risks  No Fall Risks No Fall Risks  Follow up Falls evaluation completed Falls evaluation completed Falls evaluation completed  Falls evaluation completed  Falls evaluation completed      Data saved with a previous flowsheet row definition    MEDICARE RISK AT HOME:  Medicare Risk at Home Any stairs in or around the home?: (Patient-Rptd) Yes If so, are there any without handrails?: (Patient-Rptd) No Home free of loose throw rugs in walkways, pet beds, electrical cords, etc?: (Patient-Rptd) Yes Adequate lighting in your home to reduce risk of falls?: (Patient-Rptd) Yes Life alert?: (Patient-Rptd) No Use of a cane, walker or w/c?: (Patient-Rptd) No Grab bars in the bathroom?: (Patient-Rptd) No Shower chair or bench in shower?: (Patient-Rptd) No Elevated toilet seat or a handicapped toilet?: (Patient-Rptd) Yes  TIMED UP AND GO:  Was the test performed?  No  Cognitive Function: 6CIT completed        02/21/2024    1:22 PM 02/20/2023    3:46 PM 02/04/2022    3:21 PM  6CIT Screen  What Year? 0 points 0 points 0 points  What month? 0 points 0 points 0 points  What time? 0 points 0 points 0 points  Count back from 20 0 points 2 points 0 points  Months in reverse 0 points 0 points 0 points  Repeat phrase 0 points 0 points 0 points  Total Score 0 points 2 points 0 points    Immunizations Immunization History  Administered Date(s) Administered   Fluad Quad(high Dose 65+) 05/23/2019, 07/09/2020, 07/22/2021, 07/25/2022   Fluad Trivalent(High Dose 65+) 05/18/2023   Influenza Split 04/02/2012   Influenza, High Dose Seasonal PF 04/11/2013, 04/12/2013, 05/23/2016, 03/16/2017, 03/01/2018   Influenza,inj,Quad  PF,6+ Mos 04/14/2014, 05/20/2015   Moderna Sars-Covid-2 Vaccination 09/04/2019, 10/02/2019   Pneumococcal Conjugate-13 04/14/2014   Pneumococcal Polysaccharide-23 09/26/2016   Tdap 10/06/2011   Zoster, Live 10/06/2011    Screening Tests Health Maintenance  Topic Date Due   Zoster Vaccines- Shingrix (1 of 2) 12/21/1991   DTaP/Tdap/Td (2 - Td or Tdap) 10/05/2021   INFLUENZA VACCINE  02/02/2024   Medicare Annual Wellness (AWV)  02/20/2025   Pneumococcal Vaccine: 50+ Years  Completed   HPV VACCINES  Aged Out   Meningococcal B Vaccine  Aged Out   COVID-19 Vaccine  Discontinued    Health Maintenance  Health Maintenance Due  Topic Date Due   Zoster Vaccines- Shingrix (1 of 2) 12/21/1991   DTaP/Tdap/Td (2 - Td or Tdap) 10/05/2021   INFLUENZA VACCINE  02/02/2024   Health Maintenance Items Addressed:   Additional Screening:  Vision Screening: Recommended annual ophthalmology exams for early detection of glaucoma and other disorders of the eye. Would you like a referral to an eye doctor? No    Dental Screening: Recommended annual dental exams for proper oral hygiene  Community Resource Referral / Chronic Care Management: CRR required this visit?  No   CCM required this visit?  No   Plan:    I have personally reviewed and noted  the following in the patient's chart:   Medical and social history Use of alcohol, tobacco or illicit drugs  Current medications and supplements including opioid prescriptions. Patient is not currently taking opioid prescriptions. Functional ability and status Nutritional status Physical activity Advanced directives List of other physicians Hospitalizations, surgeries, and ER visits in previous 12 months Vitals Screenings to include cognitive, depression, and falls Referrals and appointments  In addition, I have reviewed and discussed with patient certain preventive protocols, quality metrics, and best practice recommendations. A written  personalized care plan for preventive services as well as general preventive health recommendations were provided to patient.   Lyle MARLA Right, [place]   02/21/2024   After Visit Summary: (MyChart) Due to this being a telephonic visit, the after visit summary with patients personalized plan was offered to patient via MyChart   Notes: Nothing significant to report at this time.

## 2024-02-26 ENCOUNTER — Other Ambulatory Visit: Payer: Self-pay | Admitting: Family Medicine

## 2024-02-26 DIAGNOSIS — I10 Essential (primary) hypertension: Secondary | ICD-10-CM

## 2024-03-15 ENCOUNTER — Encounter: Payer: Self-pay | Admitting: Family Medicine

## 2024-03-16 ENCOUNTER — Other Ambulatory Visit: Payer: Self-pay | Admitting: Family Medicine

## 2024-03-18 ENCOUNTER — Encounter (HOSPITAL_BASED_OUTPATIENT_CLINIC_OR_DEPARTMENT_OTHER): Payer: Self-pay | Admitting: Emergency Medicine

## 2024-03-18 ENCOUNTER — Encounter: Payer: Self-pay | Admitting: Family Medicine

## 2024-03-18 ENCOUNTER — Ambulatory Visit: Payer: Self-pay

## 2024-03-18 ENCOUNTER — Ambulatory Visit (HOSPITAL_BASED_OUTPATIENT_CLINIC_OR_DEPARTMENT_OTHER)
Admission: EM | Admit: 2024-03-18 | Discharge: 2024-03-18 | Disposition: A | Discharge status: Home / Self Care | Attending: Family Medicine | Admitting: Family Medicine

## 2024-03-18 DIAGNOSIS — S91332A Puncture wound without foreign body, left foot, initial encounter: Secondary | ICD-10-CM | POA: Diagnosis not present

## 2024-03-18 DIAGNOSIS — L03116 Cellulitis of left lower limb: Secondary | ICD-10-CM | POA: Diagnosis not present

## 2024-03-18 DIAGNOSIS — R609 Edema, unspecified: Secondary | ICD-10-CM

## 2024-03-18 DIAGNOSIS — M79672 Pain in left foot: Secondary | ICD-10-CM | POA: Diagnosis not present

## 2024-03-18 DIAGNOSIS — R2242 Localized swelling, mass and lump, left lower limb: Secondary | ICD-10-CM

## 2024-03-18 DIAGNOSIS — M79662 Pain in left lower leg: Secondary | ICD-10-CM | POA: Diagnosis not present

## 2024-03-18 DIAGNOSIS — Z79899 Other long term (current) drug therapy: Secondary | ICD-10-CM

## 2024-03-18 DIAGNOSIS — M109 Gout, unspecified: Secondary | ICD-10-CM | POA: Diagnosis not present

## 2024-03-18 MED ORDER — MUPIROCIN 2 % EX OINT: 1.0000 | TOPICAL_OINTMENT | Freq: Two times a day (BID) | CUTANEOUS | 0 refills | Status: AC

## 2024-03-18 MED ORDER — CLINDAMYCIN HCL 300 MG PO CAPS: 300.0000 mg | ORAL_CAPSULE | Freq: Three times a day (TID) | ORAL | 0 refills | Status: AC

## 2024-03-18 NOTE — Telephone Encounter (Signed)
 FYI Only or Action Required?: Action required by provider: request for appointment.  Patient was last seen in primary care on 01/22/2024 by Anthony Harlene LABOR, MD.  Called Nurse Triage reporting Leg Swelling.  Symptoms began a week ago.  Interventions attempted: Other: epsom salt soaks.  Symptoms are: unchanged.  Triage Disposition: See Physician Within 24 Hours  Patient/caregiver understands and will follow disposition?: Yes    Copied from CRM [id number]. Topic: Clinical - Red Word Triage >> Mar 18, 2024  4:51 PM Donee H wrote: Red Word that prompted transfer to Nurse Triage: Patient's wife [name] calling with concern of patient's foot and ankle. She states patient accidentally got stuck with a trimmed hydrangea stalk into his left ankle on the outside of my left leg. It happened about a week ago. Just today learned that hydrangea sap is toxic to animals and humans. The area where stalk stuck  has stayed sore and now different areas of his  foot and ankle are swollen and bruised. Went to Urgent Care and was told there was nothing they could do. Wants to schedule appointment with pcp or someone in the office as soon as possible. Reason for Disposition  [1] MODERATE leg swelling (e.g., swelling extends up to knees) AND [2] new-onset or getting worse  Answer Assessment - Initial Assessment Questions Advised UC/ED. Patient reports going to UC in [place].  Trimming bushes, 2 punchered holes in leg, swollen and painful. Bruising and swollen from ankle down to toes.  1. ONSET: When did the swelling start? (e.g., minutes, hours, days)     Last Tuesday 2. LOCATION: What part of the leg is swollen?  Are both legs swollen or just one leg?     Left ankle, Break in skin ankle, not draining, swollen, redness, bruises on toes and heels; did not hurt foot, think the  3. SEVERITY: How bad is the swelling? (e.g., localized; mild, moderate, severe)     Swollen ankle, foot, going up leg 4.  REDNESS: Is there redness or signs of infection?     Yes,  redness from ankle to upper leg, warm to touch 5. PAIN: Is the swelling painful to touch? If Yes, ask: How painful is it?   (Scale 1-10; mild, moderate or severe)     Not really pain, just feel swelling 6. FEVER: Do you have a fever? If Yes, ask: What is it, how was it measured, and when did it start?      denies 7. CAUSE: What do you think is causing the leg swelling?     Puncture from branch 8. MEDICAL HISTORY: Do you have a history of blood clots (e.g., DVT), cancer, heart failure, kidney disease, or liver failure?     no  10. OTHER SYMPTOMS: Do you have any other symptoms? (e.g., chest pain, difficulty breathing)       Denies diff breathing, n/v  Protocols used: Leg Swelling and Edema-A-AH

## 2024-03-18 NOTE — Discharge Instructions (Addendum)
 Cellulitis of left foot with left foot pain secondary to an acute puncture wound from 03/12/2024: Wound is not draining so no culture taken.  Clean the puncture wound with warm soapy fingers, rinse, pat dry, apply mupirocin  ointment and a bandage.  Clean the wound once or twice daily.  Clindamycin  300 mg 1 pill 3 times daily for 10 days for cellulitis of the left leg.  Encouraged elevation of the feet as often as possible.  Encouraged low-salt diet and improved water intake to reduce chronic swelling of the lower legs.  Follow-up with primary care about chronic swelling of the lower legs.  May need an intermittent mild diuretic or compression hose.  Gout of the feet: Lab work is pending.  Will adjust the plan of care, if needed once the labs result.  For now we will continue the allopurinol  100 mg daily.  Follow-up with primary care.  Return here if needed.

## 2024-03-18 NOTE — ED Provider Notes (Signed)
 Anthony Poole CARE    CSN: [id number] Arrival date & time: 03/18/24  1755      History   Chief Complaint No chief complaint on file.   HPI Anthony Poole is a 83 y.o. male.   83 year old male who was working around the house that is needing a lot of repair and had some overgrown hide drainage appliance around the foundation.  He had cut the plants back quite a bit but was still working around both the foundation and the plants and on 03/12/2024, he got poked in his left foot and ankle twice by twigs or branches from the hide range of plant.  The left ankle and left foot has become red, warm, swollen and tender since the initial injury on 03/12/2024.  He also has gout and chronic dependent edema.  He could not get in with primary care until January 2026.     Past Medical History:  Diagnosis Date   Acute pancreatitis 10/25/2016   Allergy 10/20/2013   Anemia 07/29/2012   Bilateral lower extremity edema 10/06/2023   Chronic knee pain 11/21/2012   Left s/p torn cartilage   Constipation 10/26/2016   Costochondritis 12/12/2016   Epistaxis 09/30/2016   Essential hypertension 04/02/2012   Foot swelling 08/10/2015   GERD (gastroesophageal reflux disease)    Gout of foot 06/05/2016   Headache 04/20/2014   Hyperglycemia 06/05/2016   Hyperlipidemia, mixed 04/14/2013   Hypertension    Hypocalcemia 03/16/2017   Low back pain radiating down leg 04/29/2012   Medicare annual wellness visit, subsequent 04/14/2014   New onset a-fib [facility]) 10/01/2023   Obesity 10/20/2014   Pancreatitis 10/22/2016   Preventative health care 07/29/2012   Rash 11/19/2015   Right knee pain 11/21/2012   Left s/p torn cartilage     Shingles 12/12/2012   Sinusitis, acute 09/05/2012   Tachycardia 10/14/2014   TIA (transient ischemic attack) 09/05/2012   No acute infarct.   Periventricular white matter type changes probably related to   result of small vessel disease.   Left vertebral artery poorly delineated  and may be diminutive in   size possibly congenitally small although atherosclerotic type   changes not excluded.   Minimal to mild paranasal sinus mucosal thickening most notable   inferior aspect of the left maxillary sinus.         Tick bite 11/19/2015   Tinea pedis 07/23/2021    Patient Active Problem List   Diagnosis Date Noted   Hypertension    Bilateral lower extremity edema 10/06/2023   Persistent atrial fibrillation (HCC) 10/01/2023   Tinea pedis 07/23/2021   Hypocalcemia 03/16/2017   Constipation 10/26/2016   Pancreatitis 10/22/2016   Gout of foot 06/05/2016   Hyperglycemia 06/05/2016   Rash 11/19/2015   Foot swelling 08/10/2015   Headache 04/20/2014   Medicare annual wellness visit, subsequent 04/14/2014   Allergy 10/20/2013   Hyperlipidemia, mixed 04/14/2013   Right knee pain 11/21/2012   Chronic knee pain 11/21/2012   TIA (transient ischemic attack) 09/05/2012   Preventative health care 07/29/2012   Anemia 07/29/2012   Low back pain radiating down leg 04/29/2012   Essential hypertension 04/02/2012   GERD (gastroesophageal reflux disease) 04/02/2012    Past Surgical History:  Procedure Laterality Date   CHOLECYSTECTOMY         Home Medications    Prior to Admission medications   Medication Sig Start Date End Date Taking? Authorizing Provider  allopurinol  (ZYLOPRIM ) 100 MG tablet Take 1 tablet (100  mg total) by mouth daily. 11/30/23  Yes Domenica Harlene LABOR, MD  amLODipine  (NORVASC ) 5 MG tablet TAKE 1 TABLET (5 MG TOTAL) BY MOUTH DAILY. 02/27/24  Yes Domenica Harlene LABOR, MD  clindamycin  (CLEOCIN ) 300 MG capsule Take 1 capsule (300 mg total) by mouth 3 (three) times daily after meals for 10 days. 03/18/24 03/28/24 Yes Ival Domino, FNP  ELIQUIS  5 MG TABS tablet TAKE 1 TABLET BY MOUTH TWICE A DAY 01/24/24  Yes Webb, Padonda B, FNP  mupirocin  ointment (BACTROBAN ) 2 % Apply 1 Application topically 2 (two) times daily. 03/18/24  Yes Ival Domino, FNP  valsartan  (DIOVAN ) 40  MG tablet Take 1 tablet (40 mg total) by mouth daily. 03/18/24  Yes Domenica Harlene LABOR, MD  MAGNESIUM  PO Take 1 tablet by mouth daily.    [provider]  Misc Natural Products (OSTEO BI-FLEX ADV JOINT SHIELD PO) Take 1 tablet by mouth daily.    [provider]  Multiple Vitamins-Minerals (CENTRUM SILVER ADULT 50+ PO) Take 1 tablet by mouth daily.    [provider]  Probiotic Product (PROBIOTIC DAILY) CAPS Take 1 capsule by mouth daily. [facility]    [provider]    Family History Family History  Problem Relation Age of Onset   COPD Mother        worked in [employer], clots   Peripheral vascular disease Mother    Dementia Father    Peripheral vascular disease Brother     Social History Social History   Tobacco Use   Smoking status: Never   Smokeless tobacco: Never  Substance Use Topics   Alcohol use: No   Drug use: No     Allergies   Fish allergy and Propulsid [cisapride]   Review of Systems Review of Systems  Constitutional:  Negative for fever.  Respiratory:  Negative for cough.   Cardiovascular:  Negative for chest pain.  Gastrointestinal:  Negative for abdominal pain, constipation, diarrhea, nausea and vomiting.  Musculoskeletal:  Negative for arthralgias and back pain.  Skin:  Positive for wound (Puncture wound to left ankle and left foot with redness, swelling and tenderness.). Negative for color change and rash.  Neurological:  Negative for syncope.  All other systems reviewed and are negative.    Physical Exam Triage Vital Signs ED Triage Vitals  Encounter Vitals Group     BP 03/18/24 1810 (!) 155/82     Girls Systolic BP Percentile --      Girls Diastolic BP Percentile --      Boys Systolic BP Percentile --      Boys Diastolic BP Percentile --      Pulse Rate 03/18/24 1810 (!) 57     Resp 03/18/24 1810 20     Temp 03/18/24 1810 97.9 F (36.6 C)     Temp Source 03/18/24 1810 Oral     SpO2 03/18/24 1810  98 %     Weight --      Height --      Head Circumference --      Peak Flow --      Pain Score 03/18/24 1808 5     Pain Loc --      Pain Education --      Exclude from Growth Chart --    No data found.  Updated Vital Signs BP (!) 155/82 (BP Location: Right Arm)   Pulse (!) 57   Temp 97.9 F (36.6 C) (Oral)   Resp 20  SpO2 98%   Visual Acuity Right Eye Distance:   Left Eye Distance:   Bilateral Distance:    Right Eye Near:   Left Eye Near:    Bilateral Near:     Physical Exam Vitals and nursing note reviewed.  Constitutional:      General: He is not in acute distress.    Appearance: He is well-developed. He is not ill-appearing or toxic-appearing.  HENT:     Head: Normocephalic and atraumatic.     Right Ear: External ear normal.     Left Ear: External ear normal.     Nose: Nose normal.     Mouth/Throat:     Lips: Pink.     Mouth: Mucous membranes are moist.  Eyes:     Conjunctiva/sclera: Conjunctivae normal.     Pupils: Pupils are equal, round, and reactive to light.  Cardiovascular:     Rate and Rhythm: Normal rate and regular rhythm.     Heart sounds: S1 normal and S2 normal. No murmur heard. Pulmonary:     Effort: Pulmonary effort is normal. No respiratory distress.     Breath sounds: Normal breath sounds. No decreased breath sounds, wheezing, rhonchi or rales.  Musculoskeletal:        General: No swelling.     Right lower leg: Swelling present. No deformity, lacerations, tenderness or bony tenderness. 2+ Edema present.     Left lower leg: Swelling and tenderness present. No deformity, lacerations or bony tenderness. 3+ Edema present.     Right ankle: Swelling present. No deformity, ecchymosis or lacerations. No tenderness. Normal range of motion. Anterior drawer test negative. Normal pulse.     Right Achilles Tendon: Normal.     Left ankle: Swelling present. No deformity, ecchymosis or lacerations. Tenderness present. Normal range of motion. Anterior  drawer test negative. Normal pulse.     Left Achilles Tendon: Normal.     Right foot: Normal range of motion and normal capillary refill. Swelling present. No deformity, bunion, Charcot foot, foot drop, prominent metatarsal heads, laceration, tenderness, bony tenderness or crepitus. Normal pulse.     Left foot: Normal range of motion and normal capillary refill. Swelling, tenderness and bony tenderness present. No deformity, bunion, Charcot foot, foot drop, prominent metatarsal heads, laceration or crepitus. Normal pulse.     Comments: Patient has chronic bilateral dependent edema with some hyperpigmentation associated with chronic stasis dermatitis.  He has a history of gout.  Left lateral foot and lateral ankle with 2 small puncture wounds with erythema and edema and some ecchymosis in these areas.  He also has ecchymosis around the base of the left foot from the lateral ankle and circling around and under the medial ankle also.  Has some ecchymosis on the forefoot near the toes.  The foot is tender to touch on the lateral ankle and lateral lower foot.  See photos for more information  Skin:    General: Skin is warm and dry.     Capillary Refill: Capillary refill takes less than 2 seconds.     Findings: No rash.  Neurological:     Mental Status: He is alert and oriented to person, place, and time.  Psychiatric:        Mood and Affect: Mood normal.                 UC Treatments / Results  Labs (all labs ordered are listed, but only abnormal results are displayed) Labs Reviewed  CBC WITH DIFFERENTIAL/PLATELET  COMPREHENSIVE METABOLIC PANEL WITH GFR  URIC ACID    Uric acid: 01/22/24:    Component Ref Range & Units (hover) 1 mo ago  Uric Acid, Serum 5.7  Resulting Agency [place] [facility]     Comprehensive metabolic panel with GFR: 01/22/24:    Component Ref Range & Units (hover) 1 mo ago (01/22/24)  Sodium 139  Potassium 4.1  Chloride 105  CO2 27  Glucose, Bld 86  BUN  16  Creatinine, Ser 1.30  Total Bilirubin 0.6  Alkaline Phosphatase 65  AST 21  ALT 15  Total Protein 6.8  Albumin 4.1  GFR 51.31 Low   Comment: Calculated using the CKD-EPI Creatinine Equation (2021)  Calcium 9.2  Resulting Agency [place]      EKG   Radiology No results found.  Procedures Procedures (including critical care time)  Medications Ordered in UC Medications - No data to display  Initial Impression / Assessment and Plan / UC Course  I have reviewed the triage vital signs and the nursing notes.  Pertinent labs & imaging results that were available during my care of the patient were reviewed by me and considered in my medical decision making (see chart for details).  Plan of Care: Cellulitis of left foot with left foot pain, 2 puncture wounds in the left lateral foot, localized swelling and chronic dependent edema with a long history of gout: Encouraged to see primary care about the chronic dependent edema and signs of early stasis dermatitis.  Also see primary care about gout.  Lab work is pending.  Will adjust the plan of care, if needed once the labs result.  See discharge instructions for wound care.  Use mupirocin  at the puncture wounds once or twice daily.  Clindamycin  300 mg, 1 pill 3 times daily for 10 days.  Follow-up if symptoms do not improve, worsen or new symptoms occur.  I reviewed the plan of care with the patient and/or the patient's guardian.  The patient and/or guardian had time to ask questions and acknowledged that the questions were answered.  I provided instruction on symptoms or reasons to return here or to go to an ER, if symptoms/condition did not improve, worsened or if new symptoms occurred.  Final Clinical Impressions(s) / UC Diagnoses   Final diagnoses:  Dependent edema  Puncture wound of left foot, initial encounter  Left foot pain  Cellulitis of left foot  Localized swelling of left foot  Gout of both feet  Encounter for  long-term (current) use of medications     Discharge Instructions      Cellulitis of left foot with left foot pain secondary to an acute puncture wound from 03/12/2024: Wound is not draining so no culture taken.  Clean the puncture wound with warm soapy fingers, rinse, pat dry, apply mupirocin  ointment and a bandage.  Clean the wound once or twice daily.  Clindamycin  300 mg 1 pill 3 times daily for 10 days for cellulitis of the left leg.  Encouraged elevation of the feet as often as possible.  Encouraged low-salt diet and improved water intake to reduce chronic swelling of the lower legs.  Follow-up with primary care about chronic swelling of the lower legs.  May need an intermittent mild diuretic or compression hose.  Gout of the feet: Lab work is pending.  Will adjust the plan of care, if needed once the labs result.  For now we will continue the allopurinol  100 mg daily.  Follow-up with primary care.  Return  here if needed.     ED Prescriptions     Medication Sig Dispense Auth. Provider   clindamycin  (CLEOCIN ) 300 MG capsule Take 1 capsule (300 mg total) by mouth 3 (three) times daily after meals for 10 days. 30 capsule Ival Domino, FNP   mupirocin  ointment (BACTROBAN ) 2 % Apply 1 Application topically 2 (two) times daily. 22 g Ival Domino, FNP      PDMP not reviewed this encounter.   Ival Domino, FNP 03/18/24 [phone]

## 2024-03-18 NOTE — ED Triage Notes (Signed)
 Pt reports a Hydrangea plant stuck him twice in his left ankle area last Tuesday he went to another urgent care and they didn't know and did not prescribe anything was told to go to ER or see primary. Pt has swelling to left ankle and foot he also has bruising.

## 2024-03-19 ENCOUNTER — Ambulatory Visit (HOSPITAL_BASED_OUTPATIENT_CLINIC_OR_DEPARTMENT_OTHER): Payer: Self-pay | Admitting: Family Medicine

## 2024-03-19 LAB — COMPREHENSIVE METABOLIC PANEL WITH GFR
ALT: 16 IU/L (ref 0–44)
AST: 21 IU/L (ref 0–40)
Albumin: 4 g/dL (ref 3.7–4.7)
Alkaline Phosphatase: 89 IU/L (ref 48–129)
BUN/Creatinine Ratio: 15 (ref 10–24)
BUN: 15 mg/dL (ref 8–27)
Bilirubin Total: 0.4 mg/dL (ref 0.0–1.2)
CO2: 19 mmol/L — ABNORMAL LOW (ref 20–29)
Calcium: 9.1 mg/dL (ref 8.6–10.2)
Chloride: 108 mmol/L — ABNORMAL HIGH (ref 96–106)
Creatinine, Ser: 1.01 mg/dL (ref 0.76–1.27)
Globulin, Total: 2.5 g/dL (ref 1.5–4.5)
Glucose: 103 mg/dL — ABNORMAL HIGH (ref 70–99)
Potassium: 4.2 mmol/L (ref 3.5–5.2)
Sodium: 143 mmol/L (ref 134–144)
Total Protein: 6.5 g/dL (ref 6.0–8.5)
eGFR: 74 mL/min/1.73 (ref >59)

## 2024-03-19 LAB — CBC WITH DIFFERENTIAL/PLATELET
Basophils Absolute: 0.1 x10E3/uL (ref 0.0–0.2)
Basos: 1 %
EOS (ABSOLUTE): 0.1 x10E3/uL (ref 0.0–0.4)
Eos: 1 %
Hematocrit: 42.4 % (ref 37.5–51.0)
Hemoglobin: 14.3 g/dL (ref 13.0–17.7)
Immature Grans (Abs): 0 x10E3/uL (ref 0.0–0.1)
Immature Granulocytes: 0 %
Lymphocytes Absolute: 1.8 x10E3/uL (ref 0.7–3.1)
Lymphs: 25 %
MCH: 33.3 pg — ABNORMAL HIGH (ref 26.6–33.0)
MCHC: 33.7 g/dL (ref 31.5–35.7)
MCV: 99 fL — ABNORMAL HIGH (ref 79–97)
Monocytes Absolute: 0.6 x10E3/uL (ref 0.1–0.9)
Monocytes: 8 %
Neutrophils Absolute: 4.9 x10E3/uL (ref 1.4–7.0)
Neutrophils: 65 %
Platelets: 258 x10E3/uL (ref 150–450)
RBC: 4.29 x10E6/uL (ref 4.14–5.80)
RDW: 12.7 % (ref 11.6–15.4)
WBC: 7.5 x10E3/uL (ref 3.4–10.8)

## 2024-03-19 LAB — URIC ACID: Uric Acid: 5.2 mg/dL (ref 3.8–8.4)

## 2024-03-19 NOTE — Telephone Encounter (Signed)
 Reached out to pt and he went to urgent care yesterday, 03/18/24. He wanted Dr. Domenica to know that they gave him medication and took some photos for his chart if she wants to look at them.

## 2024-03-19 NOTE — Progress Notes (Signed)
 Patient updated that his chemistry panel or comprehensive metabolic panel was essentially normal with a very good kidney function test.  His eGFR was 74 and that significantly improved from an eGFR of 59 in April 2025.  His blood count was normal and did not show any sign of infection.  His uric acid was 5.2 which is high normal but normal.  His leg pain could be gout or could just be swelling and chronic irritation of his legs.  Updated on all of these results via voicemail message and encouraged to follow-up here or with primary care as needed.

## 2024-03-19 NOTE — Telephone Encounter (Signed)
 Error CRM sent, unsure why Pt wasn't offered an appt with another provider at our location or another  location. Disposition states to be seen within 24 hours.

## 2024-04-01 NOTE — Progress Notes (Unsigned)
 Acute Office Visit  Subjective:     Patient ID: Anthony Poole, male    DOB: [birth year], 83 y.o.   MRN: [id number]  No chief complaint on file.   HPI Patient is in today for left lower foot wound.  PMHx-HTN, A-fib  Patient has chronic bilateral dependent edema with some hyperpigmentation associated with chronic stasis dermatitis.  He has a history of gout.  Left lateral foot and lateral ankle with 2 small puncture wounds with erythema and edema and some ecchymosis in these areas.   He was seen 03/18/2024 by urgent care and was treated with oral clindamycin  and mupirocin   Hx GOUT Allopurinol  100 mg daily Lab Results  Component Value Date   LABURIC 5.2 03/18/2024    Patient denies fever, chills, SOB, CP, palpitations, dyspnea, HA, vision changes, N/V/D, abdominal pain, urinary symptoms, rash, weight changes, and recent illness or hospitalizations.    ROS  See HPI    Objective:    There were no vitals taken for this visit. {Vitals History (Optional):23777}  Physical Exam  No results found for any visits on 04/02/24.  Physical Exam Vitals and nursing note reviewed.  Constitutional:      General: He is not in acute distress.    Appearance: He is well-developed. He is not ill-appearing or toxic-appearing.  HENT:     Head: Normocephalic and atraumatic.     Right Ear: External ear normal.     Left Ear: External ear normal.     Nose: Nose normal.     Mouth/Throat:     Lips: Pink.     Mouth: Mucous membranes are moist.  Eyes:     Conjunctiva/sclera: Conjunctivae normal.     Pupils: Pupils are equal, round, and reactive to light.  Cardiovascular:     RRR. No murmur heard. Pulmonary:     Effort: Pulmonary effort is normal. No respiratory distress.     Breath sounds: Normal breath sounds. No decreased breath sounds, wheezing, rhonchi or rales.  Musculoskeletal:        General: No swelling.     Right lower leg: Swelling present. No deformity, lacerations, tenderness  or bony tenderness. 2+ Edema present.     Left lower leg: Swelling and tenderness present. No deformity, lacerations or bony tenderness. 3+ Edema present.     Right ankle: Swelling present. No deformity, ecchymosis or lacerations. No tenderness. Normal range of motion.Normal pulse.     Right Achilles Tendon: Normal.     Left ankle: Swelling present. No deformity, ecchymosis or lacerations. Tenderness present. Normal range of motion.  Normal pulse.     Left Achilles Tendon: Normal.     Right foot: Normal range of motion and normal capillary refill. Swelling present. No deformity, bunion, Charcot foot, foot drop, prominent metatarsal heads, laceration, tenderness, bony tenderness or crepitus. Normal pulse.     Left foot: Normal range of motion and normal capillary refill. Swelling, tenderness and bony tenderness present. No deformity, bunion, Charcot foot, foot drop, prominent metatarsal heads, laceration or crepitus. Normal pulse.    Skin:    General: Skin is warm and dry.     Capillary Refill: Capillary refill takes less than 2 seconds.  Neurological:     Mental Status: He is alert and oriented to person, place, and time.  Psychiatric:        Mood and Affect: Mood normal    Assessment & Plan:   Problem List Items Addressed This Visit     Bilateral lower  extremity edema   Other Visit Diagnoses       Onychomycosis    -  Primary     Cellulitis of left foot           Monitor for ulcers, infection, worsening edema and reassess in 8-12 weeks. Ref placed for podiarty and vascular Sx    No orders of the defined types were placed in this encounter.   No follow-ups on file.  Chrissie Dacquisto L Blu Lori, NP

## 2024-04-01 NOTE — Assessment & Plan Note (Addendum)
 Hydrate and monitor.  On allopurinol .  Last uric acid stable.

## 2024-04-01 NOTE — Assessment & Plan Note (Signed)
 Dependent bilateral lower extremity edema, last echo 10/2023 -EF 55%. B/l DP pulse +2.  Patient denies pain in lower extremities.  Continue to monitor symptoms and if symptoms, if persistent or worsen consider vascular surgery ref for further evaluation.

## 2024-04-02 ENCOUNTER — Encounter: Payer: Self-pay | Admitting: Student

## 2024-04-02 ENCOUNTER — Ambulatory Visit (INDEPENDENT_AMBULATORY_CARE_PROVIDER_SITE_OTHER): Admitting: Student

## 2024-04-02 VITALS — BP 126/70 | HR 43 | Temp 98.0°F | Resp 12 | Ht 75.0 in | Wt 219.4 lb

## 2024-04-02 DIAGNOSIS — B351 Tinea unguium: Secondary | ICD-10-CM

## 2024-04-02 DIAGNOSIS — R600 Localized edema: Secondary | ICD-10-CM

## 2024-04-02 DIAGNOSIS — L03116 Cellulitis of left lower limb: Secondary | ICD-10-CM

## 2024-04-02 DIAGNOSIS — Z23 Encounter for immunization: Secondary | ICD-10-CM

## 2024-04-02 DIAGNOSIS — M109 Gout, unspecified: Secondary | ICD-10-CM | POA: Diagnosis not present

## 2024-04-18 ENCOUNTER — Ambulatory Visit: Admitting: Podiatry

## 2024-04-25 ENCOUNTER — Ambulatory Visit: Admitting: Podiatry

## 2024-04-25 DIAGNOSIS — L84 Corns and callosities: Secondary | ICD-10-CM | POA: Diagnosis not present

## 2024-04-25 DIAGNOSIS — I739 Peripheral vascular disease, unspecified: Secondary | ICD-10-CM

## 2024-04-25 DIAGNOSIS — M79675 Pain in left toe(s): Secondary | ICD-10-CM | POA: Diagnosis not present

## 2024-04-25 DIAGNOSIS — M79674 Pain in right toe(s): Secondary | ICD-10-CM

## 2024-04-25 DIAGNOSIS — Z7901 Long term (current) use of anticoagulants: Secondary | ICD-10-CM | POA: Diagnosis not present

## 2024-04-25 DIAGNOSIS — B351 Tinea unguium: Secondary | ICD-10-CM | POA: Diagnosis not present

## 2024-04-25 NOTE — Progress Notes (Signed)
 Subjective:  Patient ID: Anthony Poole, male    DOB: 02-09-1941,  MRN: [id number]  Anthony Poole presents to clinic today for:  Chief Complaint  Patient presents with   [facility]    RFC with callous.  Not diabetic Eliquis    Patient notes nails are thick and elongated, causing pain in shoe gear when ambulating.  He has painful calluses on the plantar medial aspect of the first MPJ and first IPJ bilateral.  He would like to try to get scheduled with his wife for her already scheduled footcare appointment in December.  PCP is Domenica Harlene LABOR, MD. last seen 01/22/2024  Past Medical History:  Diagnosis Date   Acute pancreatitis 10/25/2016   Allergy 10/20/2013   Anemia 07/29/2012   Bilateral lower extremity edema 10/06/2023   Chronic knee pain 11/21/2012   Left s/p torn cartilage   Constipation 10/26/2016   Costochondritis 12/12/2016   Epistaxis 09/30/2016   Essential hypertension 04/02/2012   Foot swelling 08/10/2015   GERD (gastroesophageal reflux disease)    Gout of foot 06/05/2016   Headache 04/20/2014   Hyperglycemia 06/05/2016   Hyperlipidemia, mixed 04/14/2013   Hypertension    Hypocalcemia 03/16/2017   Low back pain radiating down leg 04/29/2012   Medicare annual wellness visit, subsequent 04/14/2014   New onset a-fib [facility]) 10/01/2023   Obesity 10/20/2014   Pancreatitis 10/22/2016   Preventative health care 07/29/2012   Rash 11/19/2015   Right knee pain 11/21/2012   Left s/p torn cartilage     Shingles 12/12/2012   Sinusitis, acute 09/05/2012   Tachycardia 10/14/2014   TIA (transient ischemic attack) 09/05/2012   No acute infarct.   Periventricular white matter type changes probably related to   result of small vessel disease.   Left vertebral artery poorly delineated and may be diminutive in   size possibly congenitally small although atherosclerotic type   changes not excluded.   Minimal to mild paranasal sinus mucosal thickening most notable   inferior aspect of  the left maxillary sinus.         Tick bite 11/19/2015   Tinea pedis 07/23/2021   Allergies  Allergen Reactions   Fish Allergy Itching   Propulsid [Cisapride] Hives    Objective:  Anthony Poole is a pleasant 83 y.o. male in NAD. AAO x 3.  Vascular Examination: Patient has palpable DP pulse, absent PT pulse bilateral.  Delayed capillary refill bilateral toes.  Sparse digital hair bilateral.  Proximal to distal cooling WNL bilateral.    Dermatological Examination: Interspaces are clear with no open lesions noted bilateral.  Skin is shiny and atrophic bilateral.  Nails are 3-9mm thick, with yellowish/brown discoloration, subungual debris and distal onycholysis x10.  There is pain with compression of nails x10.  There are hyperkeratotic lesions noted on the plantar medial aspect of the first MPJ and the first IPJ bilateral.     Latest Ref Rng & Units 01/22/2024    4:41 PM  Hemoglobin A1C  Hemoglobin-A1c 4.6 - 6.5 % 5.8    Patient qualifies for at-risk foot care because of PVD.  Assessment/Plan: 1. Pain due to onychomycosis of toenails of both feet   2. Callus of foot   3. Long term current use of anticoagulant   4. PVD (peripheral vascular disease)    Mycotic nails x10 were sharply debrided with sterile nail nippers and power debriding burr to decrease bulk and length.  Hyperkeratotic lesions x 4 were shaved with #312 blade.  The procedures were performed as a courtesy today so that we can get him scheduled with his wife for her already scheduled appointment in December.  If the procedures were billed today, it would be too soon for his insurance to cover the at risk footcare procedures at the next visit.  He appreciated the courtesy.  Return in about 3 months (around 07/26/2024) for RFC.   Awanda CHARM Imperial, DPM, FACFAS [facility]     [address] [place], [place] [address]                Office [phone]  Fax  [phone]

## 2024-04-28 DIAGNOSIS — M79674 Pain in right toe(s): Secondary | ICD-10-CM

## 2024-04-28 DIAGNOSIS — Z7901 Long term (current) use of anticoagulants: Secondary | ICD-10-CM | POA: Insufficient documentation

## 2024-04-28 DIAGNOSIS — L84 Corns and callosities: Secondary | ICD-10-CM | POA: Insufficient documentation

## 2024-04-28 DIAGNOSIS — B351 Tinea unguium: Secondary | ICD-10-CM | POA: Insufficient documentation

## 2024-04-28 HISTORY — DX: Tinea unguium: M79.674

## 2024-05-21 ENCOUNTER — Other Ambulatory Visit: Payer: Self-pay | Admitting: Family

## 2024-05-23 ENCOUNTER — Other Ambulatory Visit: Payer: Self-pay | Admitting: Family Medicine

## 2024-06-06 ENCOUNTER — Ambulatory Visit

## 2024-06-06 VITALS — BP 128/68 | HR 52 | Ht 75.0 in | Wt 222.6 lb

## 2024-06-06 DIAGNOSIS — I10 Essential (primary) hypertension: Secondary | ICD-10-CM | POA: Insufficient documentation

## 2024-06-06 DIAGNOSIS — E782 Mixed hyperlipidemia: Secondary | ICD-10-CM | POA: Diagnosis not present

## 2024-06-06 DIAGNOSIS — I4819 Other persistent atrial fibrillation: Secondary | ICD-10-CM | POA: Diagnosis not present

## 2024-06-06 DIAGNOSIS — R600 Localized edema: Secondary | ICD-10-CM | POA: Insufficient documentation

## 2024-06-06 MED ORDER — APIXABAN 5 MG PO TABS: 5.0000 mg | ORAL_TABLET | Freq: Two times a day (BID) | ORAL | 3 refills | Status: AC

## 2024-06-06 NOTE — Assessment & Plan Note (Signed)
 bilateral lower extremity edema. Venous insufficiency versus diastolic dysfunction versus amlodipine  effect. Echocardiogram April 2025 showed normal systolic function, diastolic function unable to assess due to A-fib.  No significant symptoms. - Advised him to cut down salt intake to less than 2 g/day. - Advised him to keep feet elevated while at rest sitting or supine. - Advised to wear compression socks knee-high while up and ambulatory. - Advised him to keep track of his daily weight and if any significant increase in weight by 2 to 3 pounds in a day or 4 to 5 pounds in a week, will indicate salt and fluid retention and should prompt initiation of loop diuretic.  He is advised to notify us  if there is such weight gain

## 2024-06-06 NOTE — Patient Instructions (Signed)

## 2024-06-06 NOTE — Assessment & Plan Note (Addendum)
 CHA2DS2-VASc score 3. Initially diagnosed, device identified using Kardia mobile at home, asymptomatic. Later confirmed at PCPs office. Remains asymptomatic. Initially scheduled for cardioversion however she later preferred to continue with rate control strategy.  Continue with Eliquis  5 mg twice daily, tolerating well [weight 101 kg and creatinine 1.01 with eGFR 74]. Refill for his Eliquis  being sent as per his request.

## 2024-06-06 NOTE — Assessment & Plan Note (Signed)
 Blood pressure well-controlled. Target below 140/80 mmHg considering his age. Continue amlodipine  5 mg once daily.   Continue valsartan  40 mg once daily.

## 2024-06-06 NOTE — Progress Notes (Signed)
 Cardiology Consultation:    Date:  06/06/2024   ID:  Anthony Poole, DOB January 17, 1941, MRN [id number]  PCP:  Anthony Harlene LABOR, MD  Cardiologist:  Anthony SAUNDERS Hikeem Andersson, MD   Referring MD: Anthony Harlene LABOR, MD   No chief complaint on file.    ASSESSMENT AND PLAN:   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 Non-smoker, does not drink alcohol.  Echocardiogram 10/24/2023 shows LVEF 55 to 60%, mild LVH, normal RV size and function, mild MR and mildly dilated ascending aorta 4.1 cm in size.    Problem List Items Addressed This Visit     Essential hypertension - Primary   Blood pressure well-controlled. Target below 140/80 mmHg considering his age. Continue amlodipine  5 mg once daily.   Continue valsartan  40 mg once daily.       Relevant Medications   apixaban  (ELIQUIS ) 5 MG TABS tablet   Hyperlipidemia, mixed   Management as per primary care. Lipid panel from 01/22/2024 total cholesterol 181, HDL 53, LDL 113, triglycerides 71. Hemoglobin A1c 5.8. Currently not on any statin therapy. Given no significant prior cardiovascular or cerebrovascular disease history, continue with dietary modifications.      Relevant Medications   apixaban  (ELIQUIS ) 5 MG TABS tablet   Persistent atrial fibrillation (HCC)   CHA2DS2-VASc score 3. Initially diagnosed, device identified using Kardia mobile at home, asymptomatic. Later confirmed at PCPs office. Remains asymptomatic. Initially scheduled for cardioversion however she later preferred to continue with rate control strategy.  Continue with Eliquis  5 mg twice daily, tolerating well [weight 101 kg and creatinine 1.01 with eGFR 74]. Refill for his Eliquis  being sent as per his  request.       Relevant Medications   apixaban  (ELIQUIS ) 5 MG TABS tablet   Bilateral lower extremity edema   bilateral lower extremity edema. Venous insufficiency versus diastolic dysfunction versus amlodipine  effect. Echocardiogram April 2025 showed normal systolic function, diastolic function unable to assess due to A-fib.  No significant symptoms. - Advised him to cut down salt intake to less than 2 g/day. - Advised him to keep feet elevated while at rest sitting or supine. - Advised to wear compression socks knee-high while up and ambulatory. - Advised him to keep track of his daily weight and if any significant increase in weight by 2 to 3 pounds in a day or 4 to 5 pounds in a week, will indicate salt and fluid retention and should prompt initiation of loop diuretic.  He is advised to notify us  if there is such weight gain         Return to clinic tentatively in 6 months.  History of Present Illness:    Anthony Poole is a 83 y.o. male who is being seen today for follow-up visit. PCP is Anthony Harlene LABOR, MD. Last visit with me in the office was 12/06/2023.  Pleasant man here for the visit today by himself.  Lives at home with his wife who recently has been diagnosed with heart failure.  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 Non-smoker, does not drink alcohol.  Echocardiogram  10/24/2023 shows LVEF 55 to 60%, mild LVH, normal RV size and function, mild MR and mildly dilated ascending aorta 4.1 cm in size.  Mentions overall he has been doing well.  Heart rate tends to remain on the slower side.  Uses Kardia mobile at home to monitor intermittently.  Remains in A-fib. Denies any chest pain, shortness of breath, orthopnea or paroxysmal  nocturnal dyspnea. Has mild bilateral lower extremity edema dependent type and uses compression socks.  No significant weight gain. Recently had an injury to his left foot while working in the garden, requiring treatment for skin injury and has healed well.  Blood work from 03/18/2024 shows BUN 15, creatinine 1.01, eGFR 74 Sodium 143 and potassium 4.2 Normal transaminases and alkaline phosphatase. CBC with hemoglobin 14.3, hematocrit 42.4, platelets 258 and WBC 7.5.   Past Medical History:  Diagnosis Date   Acute pancreatitis 10/25/2016   Allergy 10/20/2013   Anemia 07/29/2012   Bilateral lower extremity edema 10/06/2023   Chronic knee pain 11/21/2012   Left s/p torn cartilage   Constipation 10/26/2016   Costochondritis 12/12/2016   Epistaxis 09/30/2016   Essential hypertension 04/02/2012   Foot swelling 08/10/2015   GERD (gastroesophageal reflux disease)    Gout of foot 06/05/2016   Headache 04/20/2014   Hyperglycemia 06/05/2016   Hyperlipidemia, mixed 04/14/2013   Hypertension    Hypocalcemia 03/16/2017   Low back pain radiating down leg 04/29/2012   Medicare annual wellness visit, subsequent 04/14/2014   New onset a-fib (HCC) 10/01/2023   Obesity 10/20/2014   Pain due to onychomycosis of toenails of both feet 04/28/2024   Pancreatitis 10/22/2016   Preventative health care 07/29/2012   Rash 11/19/2015   Right knee pain 11/21/2012   Left s/p torn cartilage     Shingles 12/12/2012   Sinusitis, acute 09/05/2012   Tachycardia 10/14/2014   TIA (transient ischemic attack) 09/05/2012   No acute infarct.   Periventricular white matter type changes probably related to   result of small vessel disease.   Left vertebral artery poorly delineated and may be diminutive in   size possibly congenitally small although atherosclerotic type   changes not excluded.   Minimal to mild paranasal sinus mucosal thickening most notable   inferior aspect of the left maxillary sinus.         Tick  bite 11/19/2015   Tinea pedis 07/23/2021    Past Surgical History:  Procedure Laterality Date   CHOLECYSTECTOMY      Current Medications: Current Meds  Medication Sig   allopurinol  (ZYLOPRIM ) 100 MG tablet TAKE 1 TABLET BY MOUTH EVERY DAY   amLODipine  (NORVASC ) 5 MG tablet TAKE 1 TABLET (5 MG TOTAL) BY MOUTH DAILY.   MAGNESIUM  PO Take 1 tablet by mouth daily.   Misc Natural Products (OSTEO BI-FLEX ADV JOINT SHIELD PO) Take 1 tablet by mouth daily.   Multiple Vitamins-Minerals (CENTRUM SILVER ADULT 50+ PO) Take 1 tablet by mouth daily.   mupirocin  ointment (BACTROBAN ) 2 % Apply 1 Application topically 2 (two) times daily.   Probiotic Product (PROBIOTIC DAILY) CAPS Take 1 capsule by mouth daily. [facility]   valsartan  (DIOVAN ) 40 MG tablet Take 1 tablet (40 mg total) by mouth daily.   [DISCONTINUED] ELIQUIS  5 MG TABS tablet TAKE 1 TABLET BY MOUTH TWICE A DAY     Allergies:   Fish allergy and Propulsid [cisapride]   Social History   Socioeconomic History   Marital status: Married    Spouse name: Not  on file   Number of children: Not on file   Years of education: Not on file   Highest education level: 12th grade  Occupational History   Not on file  Tobacco Use   Smoking status: Never   Smokeless tobacco: Never  Substance and Sexual Activity   Alcohol use: No   Drug use: No   Sexual activity: Not Currently    Birth control/protection: Condom    Comment: lives with wife, no dietary restrictions,  no fish  Other Topics Concern   Not on file  Social History Narrative   Not on file   Social Drivers of Health   Financial Resource Strain: Low Risk  (01/21/2024)   Overall Financial Resource Strain (CARDIA)    Difficulty of Paying Living Expenses: Not hard at all  Food Insecurity: No Food Insecurity (02/21/2024)   Hunger Vital Sign    Worried About Running Out of Food in the Last Year: Never true    Ran Out of Food in the Last Year: Never true  Transportation  Needs: No Transportation Needs (02/21/2024)   PRAPARE - Administrator, Civil Service (Medical): No    Lack of Transportation (Non-Medical): No  Physical Activity: Sufficiently Active (02/21/2024)   Exercise Vital Sign    Days of Exercise per Week: 7 days    Minutes of Exercise per Session: 30 min  Stress: No Stress Concern Present (02/21/2024)   [name] of Occupational Health - Occupational Stress Questionnaire    Feeling of Stress: Only a little  Social Connections: Unknown (02/21/2024)   Social Connection and Isolation Panel    Frequency of Communication with Friends and Family: More than three times a week    Frequency of Social Gatherings with Friends and Family: Three times a week    Attends Religious Services: Patient declined    Active Member of Clubs or Organizations: Patient declined    Attends Banker Meetings: Patient declined    Marital Status: Married     Family History: The patient's family history includes COPD in his mother; Dementia in his father; Peripheral vascular disease in his brother and mother. ROS:   Please see the history of present illness.    All 14 point review of systems negative except as described per history of present illness.  EKGs/Labs/Other Studies Reviewed:    The following studies were reviewed today:   EKG:       Recent Labs: 10/06/2023: Magnesium  2.3; NT-Pro BNP 674 01/22/2024: TSH 1.40 03/18/2024: ALT 16; BUN 15; Creatinine, Ser 1.01; Hemoglobin 14.3; Platelets 258; Potassium 4.2; Sodium 143  Recent Lipid Panel    Component Value Date/Time   CHOL 181 01/22/2024 1641   TRIG 71.0 01/22/2024 1641   HDL 53.40 01/22/2024 1641   CHOLHDL 3 01/22/2024 1641   VLDL 14.2 01/22/2024 1641   LDLCALC 113 (H) 01/22/2024 1641   LDLCALC 106 (H) 03/05/2020 1401    Physical Exam:    VS:  BP 128/68   Pulse (!) 52   Ht 6' 3 (1.905 m)   Wt 222 lb 9.6 oz (101 kg)   SpO2 97%   BMI 27.82 kg/m     Wt Readings from  Last 3 Encounters:  06/06/24 222 lb 9.6 oz (101 kg)  04/02/24 219 lb 6.4 oz (99.5 kg)  02/21/24 218 lb (98.9 kg)     GENERAL:  Well nourished, well developed in no acute distress NECK: No JVD; No carotid bruits CARDIAC: Irregularly irregular [employer]  101 kg and, S1 and S2 present, no murmurs, no rubs, no gallops CHEST:  Clear to auscultation without rales, wheezing or rhonchi  Extremities: 1+ bilateral pitting pedal edema extending slightly above the ankles. Pulses bilaterally symmetric with radial 2+ and dorsalis pedis 2+ NEUROLOGIC:  Alert and oriented x 3  Medication Adjustments/Labs and Tests Ordered: Current medicines are reviewed at length with the patient today.  Concerns regarding medicines are outlined above.  No orders of the defined types were placed in this encounter.  Meds ordered this encounter  Medications   apixaban  (ELIQUIS ) 5 MG TABS tablet    Sig: Take 1 tablet (5 mg total) by mouth 2 (two) times daily.    Dispense:  180 tablet    Refill:  3    Signed, Eloy Fehl reddy Lakshya Mcgillicuddy, MD, MPH, [facility]. 06/06/2024 12:31 PM    [facility]

## 2024-06-06 NOTE — Assessment & Plan Note (Signed)
 Management as per primary care. Lipid panel from 01/22/2024 total cholesterol 181, HDL 53, LDL 113, triglycerides 71. Hemoglobin A1c 5.8. Currently not on any statin therapy. Given no significant prior cardiovascular or cerebrovascular disease history, continue with dietary modifications.

## 2024-06-12 ENCOUNTER — Ambulatory Visit: Admitting: Podiatry

## 2024-06-12 DIAGNOSIS — M79675 Pain in left toe(s): Secondary | ICD-10-CM

## 2024-06-12 DIAGNOSIS — B351 Tinea unguium: Secondary | ICD-10-CM

## 2024-06-12 DIAGNOSIS — M79674 Pain in right toe(s): Secondary | ICD-10-CM | POA: Diagnosis not present

## 2024-06-12 DIAGNOSIS — Z7901 Long term (current) use of anticoagulants: Secondary | ICD-10-CM

## 2024-06-12 DIAGNOSIS — I739 Peripheral vascular disease, unspecified: Secondary | ICD-10-CM

## 2024-06-12 DIAGNOSIS — L84 Corns and callosities: Secondary | ICD-10-CM

## 2024-06-12 NOTE — Progress Notes (Signed)
 Subjective:  Patient ID: Anthony Poole, male    DOB: [birth year],  MRN: [id number]  [name] presents to clinic today for:  Chief Complaint  Patient presents with   RFC    RFC  No calluses. Not diabetic. No anti coag    Patient notes nails are thick and elongated, causing pain in shoe gear when ambulating.  He has painful calluses on the plantar medial aspect of the first MPJ and first IPJ bilateral.    PCP is Domenica Harlene LABOR, MD. last seen 01/22/2024  Past Medical History:  Diagnosis Date   Acute pancreatitis 10/25/2016   Allergy 10/20/2013   Anemia 07/29/2012   Bilateral lower extremity edema 10/06/2023   Chronic knee pain 11/21/2012   Left s/p torn cartilage   Constipation 10/26/2016   Costochondritis 12/12/2016   Epistaxis 09/30/2016   Essential hypertension 04/02/2012   Foot swelling 08/10/2015   GERD (gastroesophageal reflux disease)    Gout of foot 06/05/2016   Headache 04/20/2014   Hyperglycemia 06/05/2016   Hyperlipidemia, mixed 04/14/2013   Hypertension    Hypocalcemia 03/16/2017   Low back pain radiating down leg 04/29/2012   Medicare annual wellness visit, subsequent 04/14/2014   New onset a-fib (HCC) 10/01/2023   Obesity 10/20/2014   Pain due to onychomycosis of toenails of both feet 04/28/2024   Pancreatitis 10/22/2016   Preventative health care 07/29/2012   Rash 11/19/2015   Right knee pain 11/21/2012   Left s/p torn cartilage     Shingles 12/12/2012   Sinusitis, acute 09/05/2012   Tachycardia 10/14/2014   TIA (transient ischemic attack) 09/05/2012   No acute infarct.   Periventricular white matter type changes probably related to   result of small vessel disease.   Left vertebral artery poorly delineated and may be diminutive in   size possibly congenitally small although atherosclerotic type   changes not excluded.   Minimal to mild paranasal sinus mucosal thickening most notable   inferior aspect of the left maxillary sinus.         Tick  bite 11/19/2015   Tinea pedis 07/23/2021   Allergies  Allergen Reactions   Fish Allergy Itching   Propulsid [Cisapride] Hives    Objective:  Anthony Poole is a pleasant 83 y.o. male in NAD. AAO x 3.  Vascular Examination: Patient has palpable DP pulse, absent PT pulse bilateral.  Delayed capillary refill bilateral toes.  Sparse digital hair bilateral.  Proximal to distal cooling WNL bilateral.    Dermatological Examination: Interspaces are clear with no open lesions noted bilateral.  Skin is shiny and atrophic bilateral.  Nails are 3-76mm thick, with yellowish/brown discoloration, subungual debris and distal onycholysis x10.  There is pain with compression of nails x10.  There are hyperkeratotic lesions noted on the plantar medial aspect of the first MPJ and the first IPJ bilateral.     Latest Ref Rng & Units 01/22/2024    4:41 PM  Hemoglobin A1C  Hemoglobin-A1c 4.6 - 6.5 % 5.8    Patient qualifies for at-risk foot care because of PVD.  Assessment/Plan: 1. Pain due to onychomycosis of toenails of both feet   2. Callus of foot   3. PVD (peripheral vascular disease)   4. Long term current use of anticoagulant    Mycotic nails x10 were sharply debrided with sterile nail nippers and power debriding burr to decrease bulk and length.  Hyperkeratotic lesions x 4 were shaved with #312 blade.  2 of the  lesions are proximal to the toe DIPJ's.  Return in about 3 months (around 09/10/2024) for [facility].   Awanda CHARM Imperial, DPM, FACFAS [facility]     [address] [place], [place] [address]                Office [phone]  Fax [phone]

## 2024-07-10 ENCOUNTER — Ambulatory Visit: Admitting: Student

## 2024-09-11 ENCOUNTER — Ambulatory Visit: Admitting: Podiatry

## 2024-09-12 ENCOUNTER — Ambulatory Visit: Admitting: Student

## 2024-09-12 ENCOUNTER — Ambulatory Visit: Admitting: Family Medicine
